# Patient Record
Sex: Male | Born: 1950 | Race: White | Hispanic: No | Marital: Married | Smoking: Former smoker
Health system: Southern US, Community
[De-identification: ages and names within clinical notes are randomized; demographics above are authoritative.]

---

## 2009-07-13 ENCOUNTER — Encounter: Payer: Self-pay | Admitting: Orthopedic Surgery

## 2009-07-14 ENCOUNTER — Encounter: Payer: Self-pay | Admitting: Orthopedic Surgery

## 2009-08-13 ENCOUNTER — Encounter: Payer: Self-pay | Admitting: Orthopedic Surgery

## 2014-01-19 ENCOUNTER — Encounter: Payer: Self-pay | Admitting: Cardiology

## 2014-02-13 ENCOUNTER — Encounter: Payer: Self-pay | Admitting: Cardiology

## 2014-03-16 ENCOUNTER — Encounter: Payer: Self-pay | Admitting: Cardiology

## 2014-04-14 ENCOUNTER — Encounter: Payer: Self-pay | Admitting: Cardiology

## 2020-11-04 ENCOUNTER — Emergency Department: Payer: Medicare Other

## 2020-11-04 ENCOUNTER — Inpatient Hospital Stay
Admission: EM | Admit: 2020-11-04 | Discharge: 2020-11-09 | DRG: 917 | Disposition: A | Discharge status: Home Health | Payer: Medicare Other | Attending: Internal Medicine | Admitting: Internal Medicine

## 2020-11-04 ENCOUNTER — Inpatient Hospital Stay: Payer: Medicare Other

## 2020-11-04 ENCOUNTER — Other Ambulatory Visit: Payer: Self-pay

## 2020-11-04 ENCOUNTER — Encounter: Payer: Self-pay | Admitting: Pulmonary Disease

## 2020-11-04 DIAGNOSIS — Z6831 Body mass index (BMI) 31.0-31.9, adult: Secondary | ICD-10-CM

## 2020-11-04 DIAGNOSIS — I959 Hypotension, unspecified: Secondary | ICD-10-CM

## 2020-11-04 DIAGNOSIS — I2510 Atherosclerotic heart disease of native coronary artery without angina pectoris: Secondary | ICD-10-CM | POA: Diagnosis present

## 2020-11-04 DIAGNOSIS — T424X1A Poisoning by benzodiazepines, accidental (unintentional), initial encounter: Secondary | ICD-10-CM | POA: Diagnosis present

## 2020-11-04 DIAGNOSIS — T796XXA Traumatic ischemia of muscle, initial encounter: Secondary | ICD-10-CM | POA: Diagnosis present

## 2020-11-04 DIAGNOSIS — G8929 Other chronic pain: Secondary | ICD-10-CM | POA: Diagnosis present

## 2020-11-04 DIAGNOSIS — T4275XA Adverse effect of unspecified antiepileptic and sedative-hypnotic drugs, initial encounter: Secondary | ICD-10-CM | POA: Diagnosis not present

## 2020-11-04 DIAGNOSIS — J9621 Acute and chronic respiratory failure with hypoxia: Secondary | ICD-10-CM | POA: Diagnosis present

## 2020-11-04 DIAGNOSIS — Z20822 Contact with and (suspected) exposure to covid-19: Secondary | ICD-10-CM | POA: Diagnosis present

## 2020-11-04 DIAGNOSIS — G929 Unspecified toxic encephalopathy: Secondary | ICD-10-CM | POA: Diagnosis present

## 2020-11-04 DIAGNOSIS — E871 Hypo-osmolality and hyponatremia: Secondary | ICD-10-CM | POA: Diagnosis present

## 2020-11-04 DIAGNOSIS — T403X1A Poisoning by methadone, accidental (unintentional), initial encounter: Secondary | ICD-10-CM | POA: Diagnosis present

## 2020-11-04 DIAGNOSIS — J45909 Unspecified asthma, uncomplicated: Secondary | ICD-10-CM | POA: Diagnosis present

## 2020-11-04 DIAGNOSIS — Z7951 Long term (current) use of inhaled steroids: Secondary | ICD-10-CM

## 2020-11-04 DIAGNOSIS — J690 Pneumonitis due to inhalation of food and vomit: Secondary | ICD-10-CM | POA: Diagnosis present

## 2020-11-04 DIAGNOSIS — E119 Type 2 diabetes mellitus without complications: Secondary | ICD-10-CM | POA: Diagnosis present

## 2020-11-04 DIAGNOSIS — R4189 Other symptoms and signs involving cognitive functions and awareness: Secondary | ICD-10-CM | POA: Diagnosis not present

## 2020-11-04 DIAGNOSIS — T50901A Poisoning by unspecified drugs, medicaments and biological substances, accidental (unintentional), initial encounter: Secondary | ICD-10-CM

## 2020-11-04 DIAGNOSIS — Z792 Long term (current) use of antibiotics: Secondary | ICD-10-CM

## 2020-11-04 DIAGNOSIS — G4733 Obstructive sleep apnea (adult) (pediatric): Secondary | ICD-10-CM | POA: Diagnosis present

## 2020-11-04 DIAGNOSIS — Z881 Allergy status to other antibiotic agents status: Secondary | ICD-10-CM

## 2020-11-04 DIAGNOSIS — I10 Essential (primary) hypertension: Secondary | ICD-10-CM | POA: Diagnosis present

## 2020-11-04 DIAGNOSIS — I214 Non-ST elevation (NSTEMI) myocardial infarction: Secondary | ICD-10-CM | POA: Diagnosis present

## 2020-11-04 DIAGNOSIS — M797 Fibromyalgia: Secondary | ICD-10-CM | POA: Diagnosis present

## 2020-11-04 DIAGNOSIS — T426X1A Poisoning by other antiepileptic and sedative-hypnotic drugs, accidental (unintentional), initial encounter: Secondary | ICD-10-CM | POA: Diagnosis present

## 2020-11-04 DIAGNOSIS — E669 Obesity, unspecified: Secondary | ICD-10-CM | POA: Diagnosis present

## 2020-11-04 DIAGNOSIS — Z7982 Long term (current) use of aspirin: Secondary | ICD-10-CM

## 2020-11-04 DIAGNOSIS — I252 Old myocardial infarction: Secondary | ICD-10-CM

## 2020-11-04 DIAGNOSIS — T40421A Poisoning by tramadol, accidental (unintentional), initial encounter: Principal | ICD-10-CM | POA: Diagnosis present

## 2020-11-04 DIAGNOSIS — N400 Enlarged prostate without lower urinary tract symptoms: Secondary | ICD-10-CM | POA: Diagnosis present

## 2020-11-04 DIAGNOSIS — E875 Hyperkalemia: Secondary | ICD-10-CM | POA: Diagnosis present

## 2020-11-04 DIAGNOSIS — M069 Rheumatoid arthritis, unspecified: Secondary | ICD-10-CM | POA: Diagnosis present

## 2020-11-04 DIAGNOSIS — Z7984 Long term (current) use of oral hypoglycemic drugs: Secondary | ICD-10-CM

## 2020-11-04 DIAGNOSIS — J8410 Pulmonary fibrosis, unspecified: Secondary | ICD-10-CM | POA: Diagnosis present

## 2020-11-04 DIAGNOSIS — I952 Hypotension due to drugs: Secondary | ICD-10-CM | POA: Diagnosis not present

## 2020-11-04 DIAGNOSIS — K219 Gastro-esophageal reflux disease without esophagitis: Secondary | ICD-10-CM | POA: Diagnosis present

## 2020-11-04 DIAGNOSIS — T50901S Poisoning by unspecified drugs, medicaments and biological substances, accidental (unintentional), sequela: Secondary | ICD-10-CM | POA: Diagnosis not present

## 2020-11-04 DIAGNOSIS — D869 Sarcoidosis, unspecified: Secondary | ICD-10-CM | POA: Diagnosis present

## 2020-11-04 DIAGNOSIS — G9340 Encephalopathy, unspecified: Secondary | ICD-10-CM | POA: Diagnosis not present

## 2020-11-04 DIAGNOSIS — N179 Acute kidney failure, unspecified: Secondary | ICD-10-CM | POA: Diagnosis present

## 2020-11-04 DIAGNOSIS — Z880 Allergy status to penicillin: Secondary | ICD-10-CM

## 2020-11-04 DIAGNOSIS — Z9119 Patient's noncompliance with other medical treatment and regimen: Secondary | ICD-10-CM

## 2020-11-04 DIAGNOSIS — R112 Nausea with vomiting, unspecified: Secondary | ICD-10-CM | POA: Diagnosis not present

## 2020-11-04 DIAGNOSIS — Z79899 Other long term (current) drug therapy: Secondary | ICD-10-CM

## 2020-11-04 DIAGNOSIS — T50901D Poisoning by unspecified drugs, medicaments and biological substances, accidental (unintentional), subsequent encounter: Secondary | ICD-10-CM | POA: Diagnosis not present

## 2020-11-04 DIAGNOSIS — J9601 Acute respiratory failure with hypoxia: Secondary | ICD-10-CM | POA: Diagnosis not present

## 2020-11-04 DIAGNOSIS — D860 Sarcoidosis of lung: Secondary | ICD-10-CM | POA: Diagnosis present

## 2020-11-04 LAB — URINE DRUG SCREEN, QUALITATIVE (ARMC ONLY)
Amphetamines, Ur Screen: NOT DETECTED
Barbiturates, Ur Screen: NOT DETECTED
Benzodiazepine, Ur Scrn: POSITIVE — AB
Cannabinoid 50 Ng, Ur ~~LOC~~: NOT DETECTED
Cocaine Metabolite,Ur ~~LOC~~: NOT DETECTED
MDMA (Ecstasy)Ur Screen: NOT DETECTED
Methadone Scn, Ur: POSITIVE — AB
Opiate, Ur Screen: POSITIVE — AB
Phencyclidine (PCP) Ur S: NOT DETECTED
Tricyclic, Ur Screen: POSITIVE — AB

## 2020-11-04 LAB — CBC WITH DIFFERENTIAL/PLATELET
Abs Immature Granulocytes: 0.06 10*3/uL (ref 0.00–0.07)
Basophils Absolute: 0.1 10*3/uL (ref 0.0–0.1)
Basophils Relative: 1 %
Eosinophils Absolute: 0.3 10*3/uL (ref 0.0–0.5)
Eosinophils Relative: 2 %
HCT: 37 % — ABNORMAL LOW (ref 39.0–52.0)
Hemoglobin: 12.5 g/dL — ABNORMAL LOW (ref 13.0–17.0)
Immature Granulocytes: 1 %
Lymphocytes Relative: 18 %
Lymphs Abs: 2.2 10*3/uL (ref 0.7–4.0)
MCH: 31.6 pg (ref 26.0–34.0)
MCHC: 33.8 g/dL (ref 30.0–36.0)
MCV: 93.7 fL (ref 80.0–100.0)
Monocytes Absolute: 0.8 10*3/uL (ref 0.1–1.0)
Monocytes Relative: 7 %
Neutro Abs: 8.4 10*3/uL — ABNORMAL HIGH (ref 1.7–7.7)
Neutrophils Relative %: 71 %
Platelets: 223 10*3/uL (ref 150–400)
RBC: 3.95 MIL/uL — ABNORMAL LOW (ref 4.22–5.81)
RDW: 13.2 % (ref 11.5–15.5)
WBC: 11.8 10*3/uL — ABNORMAL HIGH (ref 4.0–10.5)
nRBC: 0 % (ref 0.0–0.2)

## 2020-11-04 LAB — RESP PANEL BY RT-PCR (FLU A&B, COVID) ARPGX2
Influenza A by PCR: NEGATIVE
Influenza B by PCR: NEGATIVE
SARS Coronavirus 2 by RT PCR: NEGATIVE

## 2020-11-04 LAB — TROPONIN I (HIGH SENSITIVITY)
Troponin I (High Sensitivity): 12 ng/L (ref <18)
Troponin I (High Sensitivity): 11 ng/L (ref <18)

## 2020-11-04 LAB — BLOOD GAS, ARTERIAL
Acid-base deficit: 8.2 mmol/L — ABNORMAL HIGH (ref 0.0–2.0)
Bicarbonate: 18.9 mmol/L — ABNORMAL LOW (ref 20.0–28.0)
FIO2: 0.8
MECHVT: 500 mL
O2 Saturation: 89.5 %
PEEP: 5 cmH2O
Patient temperature: 37
RATE: 16 resp/min
pCO2 arterial: 44 mmHg (ref 32.0–48.0)
pH, Arterial: 7.24 — ABNORMAL LOW (ref 7.350–7.450)
pO2, Arterial: 68 mmHg — ABNORMAL LOW (ref 83.0–108.0)

## 2020-11-04 LAB — COMPREHENSIVE METABOLIC PANEL
ALT: 19 U/L (ref 0–44)
AST: 31 U/L (ref 15–41)
Albumin: 4 g/dL (ref 3.5–5.0)
Alkaline Phosphatase: 52 U/L (ref 38–126)
Anion gap: 14 (ref 5–15)
BUN: 39 mg/dL — ABNORMAL HIGH (ref 8–23)
CO2: 19 mmol/L — ABNORMAL LOW (ref 22–32)
Calcium: 10.2 mg/dL (ref 8.9–10.3)
Chloride: 95 mmol/L — ABNORMAL LOW (ref 98–111)
Creatinine, Ser: 2.95 mg/dL — ABNORMAL HIGH (ref 0.61–1.24)
GFR, Estimated: 22 mL/min — ABNORMAL LOW (ref >60)
Glucose, Bld: 221 mg/dL — ABNORMAL HIGH (ref 70–99)
Potassium: 6.7 mmol/L (ref 3.5–5.1)
Sodium: 128 mmol/L — ABNORMAL LOW (ref 135–145)
Total Bilirubin: 0.9 mg/dL (ref 0.3–1.2)
Total Protein: 7 g/dL (ref 6.5–8.1)

## 2020-11-04 LAB — BLOOD GAS, VENOUS
Acid-base deficit: 6.9 mmol/L — ABNORMAL HIGH (ref 0.0–2.0)
Bicarbonate: 20.2 mmol/L (ref 20.0–28.0)
O2 Saturation: 75.8 %
Patient temperature: 37
pCO2, Ven: 46 mmHg (ref 44.0–60.0)
pH, Ven: 7.25 (ref 7.250–7.430)
pO2, Ven: 48 mmHg — ABNORMAL HIGH (ref 32.0–45.0)

## 2020-11-04 LAB — LACTIC ACID, PLASMA
Lactic Acid, Venous: 2.6 mmol/L (ref 0.5–1.9)
Lactic Acid, Venous: 3 mmol/L (ref 0.5–1.9)
Lactic Acid, Venous: 2 mmol/L (ref 0.5–1.9)
Lactic Acid, Venous: 2.2 mmol/L (ref 0.5–1.9)

## 2020-11-04 LAB — BASIC METABOLIC PANEL
Anion gap: 8 (ref 5–15)
BUN: 38 mg/dL — ABNORMAL HIGH (ref 8–23)
CO2: 19 mmol/L — ABNORMAL LOW (ref 22–32)
Calcium: 9.3 mg/dL (ref 8.9–10.3)
Chloride: 106 mmol/L (ref 98–111)
Creatinine, Ser: 2.37 mg/dL — ABNORMAL HIGH (ref 0.61–1.24)
GFR, Estimated: 29 mL/min — ABNORMAL LOW (ref >60)
Glucose, Bld: 159 mg/dL — ABNORMAL HIGH (ref 70–99)
Potassium: 5 mmol/L (ref 3.5–5.1)
Sodium: 133 mmol/L — ABNORMAL LOW (ref 135–145)

## 2020-11-04 LAB — GLUCOSE, CAPILLARY
Glucose-Capillary: 148 mg/dL — ABNORMAL HIGH (ref 70–99)
Glucose-Capillary: 129 mg/dL — ABNORMAL HIGH (ref 70–99)

## 2020-11-04 LAB — URINALYSIS, COMPLETE (UACMP) WITH MICROSCOPIC
Bacteria, UA: NONE SEEN
Bilirubin Urine: NEGATIVE
Glucose, UA: NEGATIVE mg/dL
Hgb urine dipstick: NEGATIVE
Ketones, ur: NEGATIVE mg/dL
Leukocytes,Ua: NEGATIVE
Nitrite: NEGATIVE
Protein, ur: NEGATIVE mg/dL
Specific Gravity, Urine: 1.011 (ref 1.005–1.030)
Squamous Epithelial / HPF: NONE SEEN (ref 0–5)
pH: 5 (ref 5.0–8.0)

## 2020-11-04 LAB — ACETAMINOPHEN LEVEL
Acetaminophen (Tylenol), Serum: 73 ug/mL — ABNORMAL HIGH (ref 10–30)
Acetaminophen (Tylenol), Serum: 34 ug/mL — ABNORMAL HIGH (ref 10–30)
Acetaminophen (Tylenol), Serum: 19 ug/mL (ref 10–30)

## 2020-11-04 LAB — PROTIME-INR
INR: 1.1 (ref 0.8–1.2)
Prothrombin Time: 14.3 seconds (ref 11.4–15.2)

## 2020-11-04 LAB — SALICYLATE LEVEL: Salicylate Lvl: <7 mg/dL — ABNORMAL LOW (ref 7.0–30.0)

## 2020-11-04 LAB — PROCALCITONIN: Procalcitonin: <0.1 ng/mL

## 2020-11-04 LAB — APTT: aPTT: 34 seconds (ref 24–36)

## 2020-11-04 LAB — MRSA NEXT GEN BY PCR, NASAL: MRSA by PCR Next Gen: NOT DETECTED

## 2020-11-04 LAB — HIV ANTIBODY (ROUTINE TESTING W REFLEX): HIV Screen 4th Generation wRfx: NONREACTIVE

## 2020-11-04 LAB — CBG MONITORING, ED: Glucose-Capillary: 144 mg/dL — ABNORMAL HIGH (ref 70–99)

## 2020-11-04 LAB — CK: Total CK: 1950 U/L — ABNORMAL HIGH (ref 49–397)

## 2020-11-04 MED ORDER — HEPARIN SODIUM (PORCINE) 5000 UNIT/ML IJ SOLN
5000.0000 [IU] | Freq: Three times a day (TID) | INTRAMUSCULAR | Status: DC
  Administered 2020-11-04 – 2020-11-07 (×10): 5000 [IU] via SUBCUTANEOUS
  Filled 2020-11-04 (×10): qty 1

## 2020-11-04 MED ORDER — SODIUM CHLORIDE 0.9 % IV SOLN
1.0000 g | Freq: Once | INTRAVENOUS | Status: AC
  Administered 2020-11-04: 1 g via INTRAVENOUS
  Filled 2020-11-04: qty 10

## 2020-11-04 MED ORDER — ROCURONIUM BROMIDE 10 MG/ML (PF) SYRINGE
PREFILLED_SYRINGE | INTRAVENOUS | Status: AC
  Filled 2020-11-04: qty 10

## 2020-11-04 MED ORDER — METRONIDAZOLE 500 MG/100ML IV SOLN
500.0000 mg | Freq: Three times a day (TID) | INTRAVENOUS | Status: DC
  Administered 2020-11-04 – 2020-11-06 (×6): 500 mg via INTRAVENOUS
  Filled 2020-11-04 (×7): qty 100

## 2020-11-04 MED ORDER — SODIUM CHLORIDE 0.9 % IV SOLN
500.0000 mg | Freq: Once | INTRAVENOUS | Status: AC
  Administered 2020-11-04: 500 mg via INTRAVENOUS
  Filled 2020-11-04: qty 500

## 2020-11-04 MED ORDER — PROPOFOL 1000 MG/100ML IV EMUL
5.0000 ug/kg/min | INTRAVENOUS | Status: DC
  Administered 2020-11-04: 15 ug/kg/min via INTRAVENOUS
  Administered 2020-11-04: 5 ug/kg/min via INTRAVENOUS
  Administered 2020-11-04: 10 ug/kg/min via INTRAVENOUS
  Administered 2020-11-04: 50 ug/kg/min via INTRAVENOUS
  Administered 2020-11-05 (×3): 60 ug/kg/min via INTRAVENOUS
  Administered 2020-11-05 (×2): 50 ug/kg/min via INTRAVENOUS
  Filled 2020-11-04 (×5): qty 100

## 2020-11-04 MED ORDER — DOCUSATE SODIUM 50 MG/5ML PO LIQD: 100.0000 mg | Freq: Two times a day (BID) | ORAL | Status: DC | PRN

## 2020-11-04 MED ORDER — FAMOTIDINE 20 MG PO TABS
20.0000 mg | ORAL_TABLET | Freq: Every day | ORAL | Status: DC
  Administered 2020-11-04: 20 mg
  Filled 2020-11-04: qty 1

## 2020-11-04 MED ORDER — CHLORHEXIDINE GLUCONATE CLOTH 2 % EX PADS
6.0000 | MEDICATED_PAD | Freq: Every day | CUTANEOUS | Status: DC
  Administered 2020-11-04 – 2020-11-08 (×4): 6 via TOPICAL
  Filled 2020-11-04: qty 6

## 2020-11-04 MED ORDER — DOCUSATE SODIUM 100 MG PO CAPS: 100.0000 mg | ORAL_CAPSULE | Freq: Two times a day (BID) | ORAL | Status: DC | PRN

## 2020-11-04 MED ORDER — ETOMIDATE 2 MG/ML IV SOLN
INTRAVENOUS | Status: AC
  Filled 2020-11-04: qty 20

## 2020-11-04 MED ORDER — POLYETHYLENE GLYCOL 3350 17 G PO PACK: 17.0000 g | PACK | Freq: Every day | ORAL | Status: DC | PRN

## 2020-11-04 MED ORDER — FAMOTIDINE 20 MG IN NS 100 ML IVPB
20.0000 mg | Freq: Two times a day (BID) | INTRAVENOUS | Status: DC
  Administered 2020-11-04: 20 mg via INTRAVENOUS
  Filled 2020-11-04: qty 100

## 2020-11-04 MED ORDER — BUDESONIDE 0.5 MG/2ML IN SUSP
0.5000 mg | Freq: Two times a day (BID) | RESPIRATORY_TRACT | Status: DC
  Administered 2020-11-04 – 2020-11-09 (×11): 0.5 mg via RESPIRATORY_TRACT
  Filled 2020-11-04 (×12): qty 2

## 2020-11-04 MED ORDER — CALCIUM GLUCONATE-NACL 1-0.675 GM/50ML-% IV SOLN
1.0000 g | Freq: Once | INTRAVENOUS | Status: AC
  Administered 2020-11-04: 1000 mg via INTRAVENOUS
  Filled 2020-11-04: qty 50

## 2020-11-04 MED ORDER — ALBUTEROL SULFATE (2.5 MG/3ML) 0.083% IN NEBU
2.5000 mg | INHALATION_SOLUTION | Freq: Once | RESPIRATORY_TRACT | Status: AC
  Administered 2020-11-04: 2.5 mg via RESPIRATORY_TRACT

## 2020-11-04 MED ORDER — SODIUM CHLORIDE 0.9 % IV SOLN
3.0000 g | Freq: Once | INTRAVENOUS | Status: DC
  Filled 2020-11-04: qty 8

## 2020-11-04 MED ORDER — SODIUM ZIRCONIUM CYCLOSILICATE 5 G PO PACK
10.0000 g | PACK | Freq: Once | ORAL | Status: AC
  Administered 2020-11-04: 10 g via ORAL
  Filled 2020-11-04: qty 2
  Filled 2020-11-04: qty 1

## 2020-11-04 MED ORDER — SODIUM BICARBONATE 8.4 % IV SOLN
50.0000 meq | Freq: Once | INTRAVENOUS | Status: AC
  Administered 2020-11-04: 50 meq via INTRAVENOUS
  Filled 2020-11-04: qty 50

## 2020-11-04 MED ORDER — NALOXONE HCL 2 MG/2ML IJ SOSY
PREFILLED_SYRINGE | INTRAMUSCULAR | Status: AC
  Administered 2020-11-04: 0.4 mg via INTRAVENOUS
  Filled 2020-11-04: qty 2

## 2020-11-04 MED ORDER — NOREPINEPHRINE 4 MG/250ML-% IV SOLN
0.0000 ug/min | INTRAVENOUS | Status: DC
  Administered 2020-11-04 – 2020-11-05 (×2): 2 ug/min via INTRAVENOUS
  Filled 2020-11-04: qty 250

## 2020-11-04 MED ORDER — FAMOTIDINE 20 MG PO TABS: 20.0000 mg | ORAL_TABLET | Freq: Two times a day (BID) | ORAL | Status: DC

## 2020-11-04 MED ORDER — LACTATED RINGERS IV BOLUS
1000.0000 mL | Freq: Once | INTRAVENOUS | Status: AC
  Administered 2020-11-04: 1000 mL via INTRAVENOUS

## 2020-11-04 MED ORDER — SODIUM CHLORIDE 0.9 % IV SOLN
2.0000 g | INTRAVENOUS | Status: DC
  Administered 2020-11-05 – 2020-11-09 (×5): 2 g via INTRAVENOUS
  Filled 2020-11-04 (×2): qty 2
  Filled 2020-11-04: qty 20
  Filled 2020-11-04: qty 2
  Filled 2020-11-04 (×2): qty 20

## 2020-11-04 MED ORDER — ORAL CARE MOUTH RINSE
15.0000 mL | OROMUCOSAL | Status: DC
  Administered 2020-11-04 – 2020-11-05 (×8): 15 mL via OROMUCOSAL
  Filled 2020-11-04 (×6): qty 15

## 2020-11-04 MED ORDER — METHYLPREDNISOLONE SODIUM SUCC 40 MG IJ SOLR
40.0000 mg | Freq: Every day | INTRAMUSCULAR | Status: DC
  Administered 2020-11-04 – 2020-11-05 (×2): 40 mg via INTRAVENOUS
  Filled 2020-11-04 (×2): qty 1

## 2020-11-04 MED ORDER — LACTATED RINGERS IV SOLN: INTRAVENOUS | Status: AC

## 2020-11-04 MED ORDER — PROPOFOL 1000 MG/100ML IV EMUL: 5.0000 ug/kg/min | INTRAVENOUS | Status: DC

## 2020-11-04 MED ORDER — IPRATROPIUM-ALBUTEROL 0.5-2.5 (3) MG/3ML IN SOLN
3.0000 mL | Freq: Four times a day (QID) | RESPIRATORY_TRACT | Status: DC
  Administered 2020-11-04 – 2020-11-06 (×7): 3 mL via RESPIRATORY_TRACT
  Filled 2020-11-04 (×7): qty 3

## 2020-11-04 MED ORDER — INSULIN ASPART 100 UNIT/ML IJ SOLN
0.0000 [IU] | INTRAMUSCULAR | Status: DC
  Administered 2020-11-04 (×4): 2 [IU] via SUBCUTANEOUS
  Filled 2020-11-04 (×4): qty 1

## 2020-11-04 MED ORDER — SODIUM CHLORIDE 0.9 % IV BOLUS
1000.0000 mL | Freq: Once | INTRAVENOUS | Status: AC
  Administered 2020-11-04: 1000 mL via INTRAVENOUS

## 2020-11-04 MED ORDER — DEXTROSE 50 % IV SOLN
1.0000 | Freq: Once | INTRAVENOUS | Status: AC
  Administered 2020-11-04: 50 mL via INTRAVENOUS
  Filled 2020-11-04: qty 50

## 2020-11-04 MED ORDER — INSULIN ASPART 100 UNIT/ML IJ SOLN
10.0000 [IU] | Freq: Once | INTRAMUSCULAR | Status: AC
  Administered 2020-11-04: 10 [IU] via INTRAVENOUS
  Filled 2020-11-04: qty 1

## 2020-11-04 MED ORDER — CHLORHEXIDINE GLUCONATE 0.12% ORAL RINSE (MEDLINE KIT)
15.0000 mL | Freq: Two times a day (BID) | OROMUCOSAL | Status: DC
  Administered 2020-11-04 – 2020-11-05 (×2): 15 mL via OROMUCOSAL
  Filled 2020-11-04 (×2): qty 15

## 2020-11-04 MED ORDER — ALBUTEROL SULFATE (2.5 MG/3ML) 0.083% IN NEBU
INHALATION_SOLUTION | RESPIRATORY_TRACT | Status: AC
  Administered 2020-11-04: 2.5 mg via RESPIRATORY_TRACT
  Filled 2020-11-04: qty 3

## 2020-11-04 MED ORDER — NALOXONE HCL 2 MG/2ML IJ SOSY: 0.4000 mg | PREFILLED_SYRINGE | Freq: Once | INTRAMUSCULAR | Status: AC

## 2020-11-04 MED ORDER — PROPOFOL 1000 MG/100ML IV EMUL
INTRAVENOUS | Status: AC
  Administered 2020-11-04: 5 ug/kg/min via INTRAVENOUS
  Filled 2020-11-04: qty 100

## 2020-11-04 NOTE — Progress Notes (Signed)
Notified bedside nurse of need to draw repeat lactic acid. 

## 2020-11-04 NOTE — Consult Note (Signed)
PHARMACY CONSULT NOTE - FOLLOW UP  Pharmacy Consult for Electrolyte Monitoring and Replacement   Recent Labs: Potassium (mmol/L)  Date Value  11/04/2020 6.7 (HH)   Calcium (mg/dL)  Date Value  97/98/9211 10.2   Albumin (g/dL)  Date Value  94/17/4081 4.0   Sodium (mmol/L)  Date Value  11/04/2020 128 (L)     Assessment: Patient admitted with acute respiratory failure likely d/t overdose. Pharmacy was consulted for electrolytes replacement  Goal of Therapy:  Electrolytes within normal limites  Plan:  Potassium extremely elevated at 6.7. Insulin, calcium and D50 given per provider orders. Daily Lokelma also initialed.  Calcium within normal limits. Notes Na slight below goal at 128. Multiple bolus of NaCl given today, no additional action needed.   Will continue to follow and replace as needed.  Dillian Feig Rodriguez-Guzman PharmD, BCPS 11/04/2020 4:39 PM

## 2020-11-04 NOTE — Progress Notes (Signed)
Elink following for code sepsis 

## 2020-11-04 NOTE — Plan of Care (Signed)
  Problem: Education: Goal: Knowledge of General Education information will improve Description: Including pain rating scale, medication(s)/side effects and non-pharmacologic comfort measures Outcome: Not Progressing   Problem: Health Behavior/Discharge Planning: Goal: Ability to manage health-related needs will improve Outcome: Not Progressing   Problem: Clinical Measurements: Goal: Ability to maintain clinical measurements within normal limits will improve Outcome: Not Progressing Goal: Will remain free from infection Outcome: Not Progressing Goal: Diagnostic test results will improve Outcome: Not Progressing Goal: Respiratory complications will improve Outcome: Not Progressing Goal: Cardiovascular complication will be avoided Outcome: Not Progressing   Problem: Activity: Goal: Risk for activity intolerance will decrease Outcome: Not Progressing   Problem: Nutrition: Goal: Adequate nutrition will be maintained Outcome: Not Progressing   Problem: Coping: Goal: Level of anxiety will decrease Outcome: Not Progressing   Problem: Elimination: Goal: Will not experience complications related to bowel motility Outcome: Not Progressing Goal: Will not experience complications related to urinary retention Outcome: Not Progressing   Problem: Pain Managment: Goal: General experience of comfort will improve Outcome: Not Progressing   Problem: Safety: Goal: Ability to remain free from injury will improve Outcome: Not Progressing   Problem: Skin Integrity: Goal: Risk for impaired skin integrity will decrease Outcome: Not Progressing  Patient total care unable to make any needs known intubated and sedated

## 2020-11-04 NOTE — ED Notes (Signed)
Pt not able to protect airway at this time.   RT called to intubate pt.

## 2020-11-04 NOTE — Progress Notes (Signed)
Pt was transported to CCU from the ED while on the vent. 

## 2020-11-04 NOTE — Progress Notes (Signed)
PHARMACIST - PHYSICIAN COMMUNICATION  CONCERNING: IV to Oral Route Change Policy  RECOMMENDATION: This patient is receiving famotidine by the intravenous route.  Based on criteria approved by the Pharmacy and Therapeutics Committee, the intravenous medication(s) is/are being converted to the equivalent oral dose form(s).   DESCRIPTION: These criteria include: The patient is eating (either orally or via tube) and/or has been taking other orally administered medications for a least 24 hours The patient has no evidence of active gastrointestinal bleeding or impaired GI absorption (gastrectomy, short bowel, patient on TNA or NPO).  If you have questions about this conversion, please contact the Pharmacy Department    Tressie Ellis, [facility] 11/04/2020 5:55 PM

## 2020-11-04 NOTE — Progress Notes (Signed)
One set of blood cultures drawn before antibiotics given per bedside RN

## 2020-11-04 NOTE — Progress Notes (Signed)
Notified provider and bedside nurse of need to order and draw repeat lactic acid @ 1500.  

## 2020-11-04 NOTE — ED Notes (Signed)
Pt presents to ED with c/o of AMS and overdose on methadone. EMS states pt just started this Methadone this past Saturday for Fibromyalgia. EMS is unaware of dosage of methadone. Pt presents lethargic and will respond to only to pain.   Pt is also hypotensive of systolic in the 50's, NS bolus hung by pressure bag, MD at bedside.  EMS gave 4 mg of IM and 4mg  of of nasal.   1.5 mg of IV narcan given here, pt is starting to wake up but is still a poor historian at this time.

## 2020-11-04 NOTE — H&P (Signed)
NAME:  Ethan Frye, MRN:  [id number], DOB:  08-14-1950, LOS: 0 ADMISSION DATE:  11/04/2020, CONSULTATION DATE:  11/04/2020 REFERRING MD:  Dr. Quentin Cornwall, CHIEF COMPLAINT:  Altered Mental Status   Brief Pt Description / Synopsis:  70 year old male admitted with acute metabolic encephalopathy and acute hypoxic respiratory failure in the setting of suspected drug overdose (polysubstance including methadone) requiring intubation in the ED for airway protection.  Patient also with acute kidney injury and hyperkalemia, and concern for aspiration.  History of Present Illness:  Ethan Frye is a 70 year old with an extensive past medical history as listed below, who presented to [facility] ED on 11/04/2020 due to unresponsiveness.  Patient is currently critically ill, intubated and sedated, therefore history is obtained from patient's daughter-in-law and ED and nursing notes.  His daughter-in-law found him this morning unresponsive, with multiple pill bottles and pills scattered on his nightstand (bottles included methadone, clonazepam, Lyrica, Tramadol).  She reports he has been confused for approximately 2 months which she suspects is due to his Lyrica and clonazepam.  He was started on methadone last Saturday 10/30/2020 (supposed to can discontinue Lyrica and clonazepam), and reports he has been more confused than usual since starting the methadone.  She checked on him at approximately 1:00 am this morning and he was sleeping soundly, and no pill bottles were noted on the nightstand.  He received 8 mg of Narcan via EMS without improvement.  ED Course: Upon arrival to the ED he had spontaneous respirations, but was very somnolent, and was also hypotensive.  He was given additional dose of Narcan without response.  He remained altered with GCS of 6 and absent gag reflexes, thus he was subsequently intubated by the ED provider for airway protection.  Initial vital signs: Temperature 97.8 F axillary,  respiratory rate 18, pulse 51, blood pressure 108/72, Significant labs: Sodium 128, potassium 6.7, chloride 95, bicarb 19, glucose 221, BUN 39, creatinine 2.95, WBC 11.8 with neutrophilia, lactic acid 2.6, hemoglobin 12.5, acetaminophen 73, salicylates less than 7 ABG post intubation: pH 7.24/PCO2 44/PO2 68/HCO3 18.9 COVID-19 PCR is negative Urinalysis is pending Imaging: Chest x-ray>>Endotracheal tube is in place 2.8 cm above the carina. Lung volumes are slightly small with stable elevation of the right hemidiaphragm. Right perihilar atelectasis or infiltrate is unchanged. Patchy infiltrate has developed at the left lung baseq, possibly infectious or inflammatory in nature no pneumothorax or pleural effusion. Cardiac size within normal limits. No acute bone abnormality. CT head>>No acute intracranial pathology. There was high suspicion for probable aspiration, therefore sepsis work-up was initiated.  He was given 2 L of IV fluids along with IV azithromycin and ceftriaxone.  EKG did not exhibit peaked T waves or prolonged QT, however given his hyperkalemia he was given albuterol, calcium gluconate, insulin and D50 as temporizing measures.  PCCM is asked to admit the patient to ICU for further work-up and treatment of acute metabolic encephalopathy and acute hypoxic respiratory failure in the setting of suspected drug overdose requiring intubation in the ED for airway protection.  Patient also with acute kidney injury and hyperkalemia, and concern for aspiration.  Pertinent  Medical History  Obstructive sleep apnea not on CPAP due to intolerance Pulmonary sarcoidosis Stenosis of left bronchus Asthma Rheumatoid arthritis Coronary artery disease NSTEMI Hypertension Fibromyalgia BPH GERD Obesity Diabetes mellitus  Micro Data:  11/04/2020: SARS-CoV-2 and influenza PCR>> negative 11/04/2020: Blood culture>> 11/04/2020: Tracheal aspirate>>  Antimicrobials:  Azithromycin x1 dose  9/22 Ceftriaxone 9/22>> Flagyl 9/22>>  Significant Hospital Events:  Including procedures, antibiotic start and stop dates in addition to other pertinent events   11/04/2020: Presented to ED required in intubation for airway protection.  Concern for aspiration.  PCCM asked to admit  Interim History / Subjective:  -Presented to ED with altered mental status due to suspected drug overdose -Required intubation in the ED for airway protection -Concern for aspiration -Also with acute kidney injury and hyperkalemia ~temporizing measures given in the ED -Repeat BMP later this afternoon  Objective   Blood pressure 122/72, pulse (!) 45, temperature 97.8 F (36.6 C), temperature source Axillary, resp. rate 17, weight 97.7 kg, SpO2 100 %.    Vent Mode: AC FiO2 (%):  [80 %] 80 % Set Rate:  [16 bmp] 16 bmp Vt Set:  [500 mL] 500 mL PEEP:  [5 cmH20] 5 cmH20  No intake or output data in the 24 hours ending 11/04/20 1236 Filed Weights   11/04/20 1045  Weight: 97.7 kg    Examination: General: Acute on chronically ill-appearing male, laying in bed, intubated and sedated, no acute distress HENT: Atraumatic, normocephalic, neck supple, no JVD Lungs: Coarse breath sounds with expiratory wheezing throughout, ventilator assisted, even, nonlabored Cardiovascular: Bradycardia, regular rhythm, S1-S2, no murmurs rubs or gallops Abdomen: Obese, soft, nontender, nondistended, no guarding rebound tenderness, bowel sounds positive x4 Extremities: No deformities, no edema Neuro: Sedated, does not follow commands, pupils PERRLA GU: Foley catheter in place draining dark yellow urine  Resolved Hospital Problem list     Assessment & Plan:   Acute Hypoxic Respiratory Failure in setting of Drug overdose and suspected Aspiration PMHx of Pulmonary Sarcoidosis, OSA not on BiPAP for last 2 years due to intolerance, Asthma -Full vent support, implement lung protective strategies -Plateau pressures less than 30 cm  H20 -Wean FiO2 & PEEP as tolerated to maintain O2 sats >92% -Follow intermittent Chest X-ray & ABG as needed -Spontaneous Breathing Trials when respiratory parameters met and mental status permits -Implement VAP Bundle -Bronchodilators & Budesonide nebs -Start IV Solumedrol 40 mg daily -ABX as above  Hypotension, ? Septic shock due to Aspiration vs. Sedation related PMHx of CAD, NSTEMI, Hypertension -Continuous cardiac monitoring -Maintain MAP >65 -IV fluids (received 3L IVF boluses in ED) -Vasopressors as needed to maintain MAP goal -Trend lactic acid until normalized (2.6 ~ ) -Trend HS Troponin until peaked -Consider Echocardiogram   Concern for Aspiration -Monitor fever curve -Trend WBC's & Procalcitonin -Follow cultures as above -Continue empiric Ceftriaxone & Flagyl pending cultures & sensitivities  Acute Metabolic Encephalopathy in setting of suspected Drug Overdose (Polysubstance) & multiple metabolic derangements Sedation needs in setting of mechanical ventilation PMHx of Fibromyalgia, chronic pain -Maintain a RASS goal of 0 to -1 -Propofol as needed to maintain RASS goal -Avoid sedating medications as able -Daily wake up assessment -CT Head 11/04/20 negative -Urine drug screen positive for Benzodiazepines, Methadone, Opiates, and Tricyclics  Drug Overdose (Polysubstance: Methadone, benzodiazepines, opiates, tricyclics) Elevated Serum Tylenol (serum level 73 at admission) -Cardiac monitoring -Serial EKG's to follow QTc -Support ABC's -Follow BMP -Recheck serum tylenol in 4 hrs  -LFTs currently normal, will trend  Acute Kidney Injury Hyperkalemia Hyponatremia PMHx of BPH -Monitor I&O's / urinary output -Follow BMP -Ensure adequate renal perfusion -Avoid nephrotoxic agents as able -Replace electrolytes as indicated -IV Fluids -Check CK -Obtain Renal US -Received temporizing measures in ED (albuterol, calcium gluconate, insulin plus D50) ~repeat BMP later  this evening  Anemia without s/sx of overt bleeding -Monitor for S/Sx of bleeding -Trend CBC -Heparin SQ  for VTE Prophylaxis  -Transfuse for Hgb <7  Diabetes Mellitus -CBG's q4h; Target range of 140 to 180 -SSI -Follow ICU Hypo/Hyperglycemia protocol -Check Hgb A1c   Best Practice (right click and "Reselect all SmartList Selections" daily)   Diet/type: NPO DVT prophylaxis: prophylactic heparin  GI prophylaxis: H2B Lines: N/A Foley:  Yes, and it is still needed Code Status:  full code Last date of multidisciplinary goals of care discussion [N/A]  Updated pt's daughter-in-law at bedside 11/04/20   Labs   CBC: Recent Labs  Lab 11/04/20 0920  WBC 11.8*  NEUTROABS 8.4*  HGB 12.5*  HCT 37.0*  MCV 93.7  PLT 353    Basic Metabolic Panel: Recent Labs  Lab 11/04/20 0920  NA 128*  K 6.7*  CL 95*  CO2 19*  GLUCOSE 221*  BUN 39*  CREATININE 2.95*  CALCIUM 10.2   GFR: CrCl cannot be calculated (Unknown ideal weight.). Recent Labs  Lab 11/04/20 0920 11/04/20 1038  WBC 11.8*  --   LATICACIDVEN  --  2.6*    Liver Function Tests: Recent Labs  Lab 11/04/20 0920  AST 31  ALT 19  ALKPHOS 52  BILITOT 0.9  PROT 7.0  ALBUMIN 4.0   No results for input(s): LIPASE, AMYLASE in the last 168 hours. No results for input(s): AMMONIA in the last 168 hours.  ABG    Component Value Date/Time   PHART 7.24 (L) 11/04/2020 1020   PCO2ART 44 11/04/2020 1020   PO2ART 68 (L) 11/04/2020 1020   HCO3 20.2 11/04/2020 1042   ACIDBASEDEF 6.9 (H) 11/04/2020 1042   O2SAT 75.8 11/04/2020 1042     Coagulation Profile: Recent Labs  Lab 11/04/20 1039  INR 1.1    Cardiac Enzymes: No results for input(s): CKTOTAL, CKMB, CKMBINDEX, TROPONINI in the last 168 hours.  HbA1C: No results found for: HGBA1C  CBG: No results for input(s): GLUCAP in the last 168 hours.  Review of Systems:   Unable to assess due to AMS, intubation, sedation   Past Medical History:  He,  has  no past medical history on file.   Surgical History:     Social History:      Family History:  His family history is not on file.   Allergies Allergies  Allergen Reactions   Penicillins Rash   Ciprofloxacin     Other reaction(s): Dizziness Pt reports he took one dose and fainted, fell and hurt his leg   Azithromycin      Home Medications  Prior to Admission medications   Medication Sig Start Date End Date Taking? Authorizing Provider  acidophilus (RISAQUAD) CAPS capsule Take 1 capsule by mouth daily.   Yes [provider]  albuterol (PROVENTIL) (2.5 MG/3ML) 0.083% nebulizer solution Take 2.5 mg by nebulization every 6 (six) hours as needed for wheezing or shortness of breath.   Yes [provider]  albuterol (VENTOLIN HFA) 108 (90 Base) MCG/ACT inhaler Inhale 2 puffs into the lungs every 6 (six) hours as needed for wheezing or shortness of breath.   Yes [provider]  aspirin EC 81 MG tablet Take 81 mg by mouth daily. Swallow whole.   Yes [provider]  atorvastatin (LIPITOR) 20 MG tablet Take 20 mg by mouth daily. 09/14/20  Yes [provider]  azithromycin (ZITHROMAX) 500 MG tablet Take 500 mg by mouth every Monday, Wednesday, and Friday. 09/29/20  Yes [provider]  carvedilol (COREG) 25 MG tablet Take 25 mg by mouth 2 (two) times  daily. 08/16/20  Yes [provider]  celecoxib (CELEBREX) 200 MG capsule Take 200 mg by mouth daily as needed for pain. 07/15/20  Yes [provider]  cetirizine (ZYRTEC) 10 MG tablet Take 10 mg by mouth daily.   Yes [provider]  cholecalciferol (VITAMIN D) 25 MCG (1000 UNIT) tablet Take 1,000 Units by mouth daily.   Yes [provider]  clonazePAM (KLONOPIN) 1 MG tablet Take 1 mg by mouth 2 (two) times daily.   Yes [provider]  diclofenac Sodium (VOLTAREN) 1 % GEL Apply 2 g topically 4 (four) times daily.   Yes [provider]  doxepin  (SINEQUAN) 50 MG capsule Take 150 mg by mouth at bedtime. 10/21/20  Yes [provider]  DULoxetine (CYMBALTA) 60 MG capsule Take 60 mg by mouth daily. 09/01/20  Yes [provider]  ferrous sulfate 325 (65 FE) MG tablet Take 325 mg by mouth daily with breakfast.   Yes [provider]  fluticasone-salmeterol (ADVAIR) 500-50 MCG/ACT AEPB Inhale 1 puff into the lungs every 12 (twelve) hours.   Yes [provider]  HYDROcodone-acetaminophen (NORCO/VICODIN) 5-325 MG tablet Take 1 tablet by mouth every 4 (four) hours as needed for pain. 10/07/20  Yes [provider]  lisinopril (ZESTRIL) 40 MG tablet Take 40 mg by mouth daily. 10/02/20  Yes [provider]  metFORMIN (GLUCOPHAGE) 500 MG tablet Take 500-1,000 mg by mouth 2 (two) times daily with a meal. 08/04/20  Yes [provider]  methadone (DOLOPHINE) 5 MG tablet Take 2.5-5 mg by mouth 2 (two) times daily. Take 2.5 mg (one-half tablet) twice daily for one week then increase to 5 mg (one tablet) twice daily, thereafter 10/27/20  Yes [provider]  montelukast (SINGULAIR) 10 MG tablet Take 10 mg by mouth daily. 09/16/20  Yes [provider]  Multiple Vitamins-Minerals (MULTIVITAMIN WITH MINERALS) tablet Take 1 tablet by mouth daily.   Yes [provider]  nitrofurantoin, macrocrystal-monohydrate, (MACROBID) 100 MG capsule Take 100 mg by mouth every Monday, Wednesday, and Friday.   Yes [provider]  omeprazole (PRILOSEC) 40 MG capsule Take 40 mg by mouth daily. 09/08/20  Yes [provider]  pimecrolimus (ELIDEL) 1 % cream Apply 1 application topically 2 (two) times daily.   Yes [provider]  polyethylene glycol (MIRALAX / GLYCOLAX) 17 g packet Take 17 g by mouth 2 (two) times daily.   Yes [provider]  pregabalin (LYRICA) 75 MG capsule Take 75 mg by mouth 3 (three) times daily. 10/21/20  Yes [provider]  senna-docusate  (SENOKOT-S) 8.6-50 MG tablet Take 1 tablet by mouth daily.   Yes [provider]  spironolactone (ALDACTONE) 25 MG tablet Take 12.5 mg by mouth daily. 07/15/20  Yes [provider]  tiZANidine (ZANAFLEX) 4 MG tablet Take 4 mg by mouth 3 (three) times daily. 09/01/20  Yes [provider]  traMADol (ULTRAM) 50 MG tablet Take 50 mg by mouth every 6 (six) hours as needed for pain.   Yes [provider]  vitamin B-12 (CYANOCOBALAMIN) 1000 MCG tablet Take 1,000 mcg by mouth daily.   Yes [provider]  nystatin (MYCOSTATIN) 100000 UNIT/ML suspension Take 2 mLs by mouth in the morning, at noon, in the evening, and at bedtime. 06/22/20   [provider]  SUMAtriptan (IMITREX) 50 MG tablet Take 50 mg by mouth as directed. 10/16/20   [provider]     Critical care time: 2  minutes     Darel Hong, AGACNP-BC [facility] Prefer epic messenger for cross cover needs If after hours, please call E-link

## 2020-11-04 NOTE — ED Notes (Signed)
7.5 ET tube  23@ teeth, good color change on capno

## 2020-11-04 NOTE — Consult Note (Signed)
CODE SEPSIS - PHARMACY COMMUNICATION  **Broad Spectrum Antibiotics should be administered within 1 hour of Sepsis diagnosis**  Time Code Sepsis Called/Page Received: 4158  Antibiotics Ordered: Ceftriaxone & Azithromycin  Time of 1st antibiotic administration: 1037  Additional action taken by pharmacy: none  If necessary, Name of Provider/Nurse Contacted: N/A   Machael Raine Rodriguez-Guzman PharmD, BCPS 11/04/2020 10:00 AM

## 2020-11-04 NOTE — ED Provider Notes (Signed)
[facility] Emergency Department Provider Note    Event Date/Time   First MD Initiated Contact with Patient 11/04/20 [phone]     (approximate)  I have reviewed the triage vital signs and the nursing notes.   HISTORY  Chief Complaint Drug Overdose  Level V Caveat:  AMS  HPI EURAL HOVORKA is a 70 y.o. male with extensive past medical history presents to the ER found unresponsive by daughter-in-law after suspected overdose.  Last seen normal was several days ago.  Reportedly was recently started on methadone does have a history of taking more medication than he is supposed to.  Patient was given 8 mg of Narcan via EMS without improvement.  Patient with spontaneous respirations but very drowsy.  Reportedly hypotensive via EMS was given IV fluids.  No past medical history on file. No family history on file.  Patient Active Problem List   Diagnosis Date Noted   Drug overdose 11/04/2020       Prior to Admission medications   Medication Sig Start Date End Date Taking? Authorizing Provider  acidophilus (RISAQUAD) CAPS capsule Take 1 capsule by mouth daily.   Yes [provider]  albuterol (PROVENTIL) (2.5 MG/3ML) 0.083% nebulizer solution Take 2.5 mg by nebulization every 6 (six) hours as needed for wheezing or shortness of breath.   Yes [provider]  albuterol (VENTOLIN HFA) 108 (90 Base) MCG/ACT inhaler Inhale 2 puffs into the lungs every 6 (six) hours as needed for wheezing or shortness of breath.   Yes [provider]  aspirin EC 81 MG tablet Take 81 mg by mouth daily. Swallow whole.   Yes [provider]  atorvastatin (LIPITOR) 20 MG tablet Take 20 mg by mouth daily. 09/14/20  Yes [provider]  azithromycin (ZITHROMAX) 500 MG tablet Take 500 mg by mouth every Monday, Wednesday, and Friday. 09/29/20  Yes [provider]  carvedilol (COREG) 25 MG tablet Take 25 mg by mouth 2 (two) times daily. 08/16/20   Yes [provider]  celecoxib (CELEBREX) 200 MG capsule Take 200 mg by mouth daily as needed for pain. 07/15/20  Yes [provider]  cetirizine (ZYRTEC) 10 MG tablet Take 10 mg by mouth daily.   Yes [provider]  cholecalciferol (VITAMIN D) 25 MCG (1000 UNIT) tablet Take 1,000 Units by mouth daily.   Yes [provider]  clonazePAM (KLONOPIN) 1 MG tablet Take 1 mg by mouth 2 (two) times daily.   Yes [provider]  diclofenac Sodium (VOLTAREN) 1 % GEL Apply 2 g topically 4 (four) times daily.   Yes [provider]  doxepin (SINEQUAN) 50 MG capsule Take 150 mg by mouth at bedtime. 10/21/20  Yes [provider]  DULoxetine (CYMBALTA) 60 MG capsule Take 60 mg by mouth daily. 09/01/20  Yes [provider]  ferrous sulfate 325 (65 FE) MG tablet Take 325 mg by mouth daily with breakfast.   Yes [provider]  fluticasone-salmeterol (ADVAIR) 500-50 MCG/ACT AEPB Inhale 1 puff into the lungs every 12 (twelve) hours.   Yes [provider]  HYDROcodone-acetaminophen (NORCO/VICODIN) 5-325 MG tablet Take 1 tablet by mouth every 4 (four) hours as needed for pain. 10/07/20  Yes [provider]  lisinopril (ZESTRIL) 40 MG tablet Take 40 mg by mouth daily. 10/02/20  Yes [provider]  metFORMIN (GLUCOPHAGE) 500 MG tablet Take 500-1,000 mg by mouth 2 (two) times daily with a meal. 08/04/20  Yes [provider]  methadone (DOLOPHINE) 5 MG tablet Take 2.5-5 mg by mouth 2 (two) times daily. Take 2.5 mg (one-half tablet) twice daily for one week then increase to 5 mg (one tablet) twice daily, thereafter 10/27/20  Yes [provider]  montelukast (SINGULAIR) 10 MG tablet Take 10 mg by mouth daily. 09/16/20  Yes [provider]  Multiple Vitamins-Minerals (MULTIVITAMIN WITH MINERALS) tablet Take 1 tablet by mouth daily.   Yes [provider]  nitrofurantoin, macrocrystal-monohydrate,  (MACROBID) 100 MG capsule Take 100 mg by mouth every Monday, Wednesday, and Friday.   Yes [provider]  omeprazole (PRILOSEC) 40 MG capsule Take 40 mg by mouth daily. 09/08/20  Yes [provider]  pimecrolimus (ELIDEL) 1 % cream Apply 1 application topically 2 (two) times daily.   Yes [provider]  polyethylene glycol (MIRALAX / GLYCOLAX) 17 g packet Take 17 g by mouth 2 (two) times daily.   Yes [provider]  pregabalin (LYRICA) 75 MG capsule Take 75 mg by mouth 3 (three) times daily. 10/21/20  Yes [provider]  senna-docusate (SENOKOT-S) 8.6-50 MG tablet Take 1 tablet by mouth daily.   Yes [provider]  spironolactone (ALDACTONE) 25 MG tablet Take 12.5 mg by mouth daily. 07/15/20  Yes [provider]  tiZANidine (ZANAFLEX) 4 MG tablet Take 4 mg by mouth 3 (three) times daily. 09/01/20  Yes [provider]  traMADol (ULTRAM) 50 MG tablet Take 50 mg by mouth every 6 (six) hours as needed for pain.   Yes [provider]  vitamin B-12 (CYANOCOBALAMIN) 1000 MCG tablet Take 1,000 mcg by mouth daily.   Yes [provider]  nystatin (MYCOSTATIN) 100000 UNIT/ML suspension Take 2 mLs by mouth in the morning, at noon, in the evening, and at bedtime. 06/22/20   [provider]  SUMAtriptan (IMITREX) 50 MG tablet Take 50 mg by mouth as directed. 10/16/20   [provider]    Allergies Penicillins, Ciprofloxacin, and Azithromycin    Social History    Review of Systems Patient denies headaches, rhinorrhea, blurry vision, numbness, shortness of breath, chest pain, edema, cough, abdominal pain, nausea, vomiting, diarrhea, dysuria, fevers, rashes or hallucinations unless otherwise stated above in HPI. ____________________________________________   PHYSICAL EXAM:  VITAL SIGNS: Vitals:   11/04/20 1100 11/04/20 1200  BP: 125/76 122/72  Pulse: (!) 51 (!) 45  Resp: 18 17  Temp:    SpO2:  100% 100%    Constitutional: ill appearing, gcs 6, snoring respirations  Eyes: Conjunctivae are normal. 89mm bilat Head: Atraumatic. Nose: No congestion/rhinnorhea. Mouth/Throat: Mucous membranes are moist.   Neck: No stridor. Painless ROM.  Cardiovascular: Normal rate, regular rhythm. Grossly normal heart sounds.   Respiratory: spontaneous, snoring respirations Gastrointestinal: Soft and nontender. No abdominal bruits. No CVA tenderness. Genitourinary: deferred Musculoskeletal: No lower extremity tenderness nor edema.  No joint effusions. Neurologic:  gcs 6 Skin:  Skin is warm, dry and intact. No rash noted. Psychiatric: unable to assess  ____________________________________________   LABS (all labs ordered are listed, but only abnormal results are displayed)  Results for orders placed or performed during the hospital encounter of 11/04/20 (from the past 24 hour(s))  Urine Drug Screen, Qualitative ([facility] only)     Status: Abnormal   Collection Time: 11/04/20  9:16 AM  Result Value Ref Range   Tricyclic, Ur Screen POSITIVE (A) NONE DETECTED   Amphetamines, Ur Screen NONE DETECTED NONE DETECTED   MDMA (Ecstasy)Ur Screen NONE DETECTED NONE DETECTED  Cocaine Metabolite,Ur [place] NONE DETECTED NONE DETECTED   Opiate, Ur Screen POSITIVE (A) NONE DETECTED   Phencyclidine (PCP) Ur S NONE DETECTED NONE DETECTED   Cannabinoid 50 Ng, Ur [place] NONE DETECTED NONE DETECTED   Barbiturates, Ur Screen NONE DETECTED NONE DETECTED   Benzodiazepine, Ur Scrn POSITIVE (A) NONE DETECTED   Methadone Scn, Ur POSITIVE (A) NONE DETECTED  CBC with Differential     Status: Abnormal   Collection Time: 11/04/20  9:20 AM  Result Value Ref Range   WBC 11.8 (H) 4.0 - 10.5 K/uL   RBC 3.95 (L) 4.22 - 5.81 MIL/uL   Hemoglobin 12.5 (L) 13.0 - 17.0 g/dL   HCT 22.0 (L) 25.4 - 27.0 %   MCV 93.7 80.0 - 100.0 fL   MCH 31.6 26.0 - 34.0 pg   MCHC 33.8 30.0 - 36.0 g/dL   RDW 62.3 76.2 - 83.1 %   Platelets 223 150 - 400  K/uL   nRBC 0.0 0.0 - 0.2 %   Neutrophils Relative % 71 %   Neutro Abs 8.4 (H) 1.7 - 7.7 K/uL   Lymphocytes Relative 18 %   Lymphs Abs 2.2 0.7 - 4.0 K/uL   Monocytes Relative 7 %   Monocytes Absolute 0.8 0.1 - 1.0 K/uL   Eosinophils Relative 2 %   Eosinophils Absolute 0.3 0.0 - 0.5 K/uL   Basophils Relative 1 %   Basophils Absolute 0.1 0.0 - 0.1 K/uL   Immature Granulocytes 1 %   Abs Immature Granulocytes 0.06 0.00 - 0.07 K/uL  Comprehensive metabolic panel     Status: Abnormal   Collection Time: 11/04/20  9:20 AM  Result Value Ref Range   Sodium 128 (L) 135 - 145 mmol/L   Potassium 6.7 (HH) 3.5 - 5.1 mmol/L   Chloride 95 (L) 98 - 111 mmol/L   CO2 19 (L) 22 - 32 mmol/L   Glucose, Bld 221 (H) 70 - 99 mg/dL   BUN 39 (H) 8 - 23 mg/dL   Creatinine, Ser 5.17 (H) 0.61 - 1.24 mg/dL   Calcium 61.6 8.9 - 07.3 mg/dL   Total Protein 7.0 6.5 - 8.1 g/dL   Albumin 4.0 3.5 - 5.0 g/dL   AST 31 15 - 41 U/L   ALT 19 0 - 44 U/L   Alkaline Phosphatase 52 38 - 126 U/L   Total Bilirubin 0.9 0.3 - 1.2 mg/dL   GFR, Estimated 22 (L) >60 mL/min   Anion gap 14 5 - 15  Acetaminophen level     Status: Abnormal   Collection Time: 11/04/20  9:20 AM  Result Value Ref Range   Acetaminophen (Tylenol), Serum 73 (H) 10 - 30 ug/mL  Salicylate level     Status: Abnormal   Collection Time: 11/04/20  9:20 AM  Result Value Ref Range   Salicylate Lvl <7.0 (L) 7.0 - 30.0 mg/dL  Blood gas, arterial     Status: Abnormal   Collection Time: 11/04/20 10:20 AM  Result Value Ref Range   FIO2 0.80    Delivery systems VENTILATOR    Mode ASSIST CONTROL    VT 500 mL   LHR 16 resp/min   Peep/cpap 5.0 cm H20   pH, Arterial 7.24 (L) 7.350 - 7.450   pCO2 arterial 44 32.0 - 48.0 mmHg   pO2, Arterial 68 (L) 83.0 - 108.0 mmHg   Bicarbonate 18.9 (L) 20.0 - 28.0 mmol/L   Acid-base deficit 8.2 (H) 0.0 - 2.0 mmol/L   O2 Saturation 89.5 %  Patient temperature 37.0    Collection site LEFT RADIAL    Sample type ARTERIAL DRAW     Allens test (pass/fail) PASS PASS  Resp Panel by RT-PCR (Flu A&B, Covid) Urine, Clean Catch     Status: None   Collection Time: 11/04/20 10:38 AM   Specimen: Urine, Clean Catch; Nasopharyngeal(NP) swabs in vial transport medium  Result Value Ref Range   SARS Coronavirus 2 by RT PCR NEGATIVE NEGATIVE   Influenza A by PCR NEGATIVE NEGATIVE   Influenza B by PCR NEGATIVE NEGATIVE  Lactic acid, plasma     Status: Abnormal   Collection Time: 11/04/20 10:38 AM  Result Value Ref Range   Lactic Acid, Venous 2.6 (HH) 0.5 - 1.9 mmol/L  Protime-INR     Status: None   Collection Time: 11/04/20 10:39 AM  Result Value Ref Range   Prothrombin Time 14.3 11.4 - 15.2 seconds   INR 1.1 0.8 - 1.2  APTT     Status: None   Collection Time: 11/04/20 10:39 AM  Result Value Ref Range   aPTT 34 24 - 36 seconds  Blood gas, venous     Status: Abnormal   Collection Time: 11/04/20 10:42 AM  Result Value Ref Range   pH, Ven 7.25 7.250 - 7.430   pCO2, Ven 46 44.0 - 60.0 mmHg   pO2, Ven 48.0 (H) 32.0 - 45.0 mmHg   Bicarbonate 20.2 20.0 - 28.0 mmol/L   Acid-base deficit 6.9 (H) 0.0 - 2.0 mmol/L   O2 Saturation 75.8 %   Patient temperature 37.0    Collection site VEIN    Sample type VENOUS    ____________________________________________  EKG My review and personal interpretation at Time: 9:25   Indication: ams  Rate: 50  Rhythm: sinus brady Axis: normal Other: normal intervals, no stemi ____________________________________________  RADIOLOGY  I personally reviewed all radiographic images ordered to evaluate for the above acute complaints and reviewed radiology reports and findings.  These findings were personally discussed with the patient.  Please see medical record for radiology report.  ____________________________________________   PROCEDURES  Procedure(s) performed:  .Critical Care Performed by: Willy Eddy, MD Authorized by: Willy Eddy, MD   Critical care provider  statement:    Critical care time (minutes):  45   Critical care time was exclusive of:  Separately billable procedures and treating other patients   Critical care was necessary to treat or prevent imminent or life-threatening deterioration of the following conditions:  Renal failure and CNS failure or compromise   Critical care was time spent personally by me on the following activities:  Development of treatment plan with patient or surrogate, discussions with consultants, evaluation of patient's response to treatment, examination of patient, obtaining history from patient or surrogate, ordering and performing treatments and interventions, ordering and review of laboratory studies, ordering and review of radiographic studies, pulse oximetry, re-evaluation of patient's condition and review of old charts Procedure Name: Intubation Date/Time: 11/04/2020 10:48 AM Performed by: Willy Eddy, MD Pre-anesthesia Checklist: Patient identified, Emergency Drugs available, Suction available and Patient being monitored Oxygen Delivery Method: Nasal cannula Preoxygenation: Pre-oxygenation with 100% oxygen Induction Type: IV induction and Rapid sequence Ventilation: Mask ventilation without difficulty Laryngoscope Size: Glidescope and 4 Grade View: Grade III Tube size: 7.5 mm Number of attempts: 1 Airway Equipment and Method: Video-laryngoscopy Placement Confirmation: ETT inserted through vocal cords under direct vision, CO2 detector, Breath sounds checked- equal and bilateral and Positive ETCO2 Secured at: 23 cm Tube secured with: ETT  holder Dental Injury: Teeth and Oropharynx as per pre-operative assessment        Critical Care performed: yes ____________________________________________   INITIAL IMPRESSION / ASSESSMENT AND PLAN / ED COURSE  Pertinent labs & imaging results that were available during my care of the patient were reviewed by me and considered in my medical decision making  (see chart for details).   DDX: Dehydration, sepsis, pna, uti, hypoglycemia, cva, drug effect, withdrawal, encephalitis  EMMITT MATTHEWS is a 70 y.o. who presents to the ED with [place] as described above.  Patient critically ill-appearing.  Given concern for possible overdose of methadone and who is placed on supplemental oxygen attached to cardiac monitor and given additional dose of IV Narcan.  After few minutes with no response the patient still with altered mental status and absent gag reflex we proceeded with intubation for airway protection.  He is hypotensive tensive persistently despite IV fluids.  Blood work sent with above differential.  Given empiric antibiotics due to concern for possible sepsis but I have a higher suspicion for probable aspiration based on his presentation.  Will order CT imaging.  Clinical Course as of 11/04/20 1238  Thu Nov 04, 2020  1002 Patient acutely hyperkalemic.  Does not have peaked T waves or prolonged QT on EKG we will temporize with IV calcium IV bicarb, albuterol IV insulin. [PR]  1105 Patient's Tylenol level is 73.  Uncertain time of possible ingestion and therefore will repeat in 4 hours to make sure it is not increasing. [PR]  1137 CT head by my review does not show any evidence of IPH or SDH.  Patient will be admitted to ICU. [PR]    Clinical Course User Index [PR] Willy Eddy, MD    The patient was evaluated in Emergency Department today for the symptoms described in the history of present illness. He/she was evaluated in the context of the global COVID-19 pandemic, which necessitated consideration that the patient might be at risk for infection with the SARS-CoV-2 virus that causes COVID-19. Institutional protocols and algorithms that pertain to the evaluation of patients at risk for COVID-19 are in a state of rapid change based on information released by regulatory bodies including the CDC and federal and state organizations. These policies  and algorithms were followed during the patient's care in the ED.  As part of my medical decision making, I reviewed the following data within the electronic MEDICAL RECORD NUMBER Nursing notes reviewed and incorporated, Labs reviewed, notes from prior ED visits and [place] Controlled Substance Database   ____________________________________________   FINAL CLINICAL IMPRESSION(S) / ED DIAGNOSES  Final diagnoses:  Unresponsive  Acute hyperkalemia  Hypotension, unspecified hypotension type      NEW MEDICATIONS STARTED DURING THIS VISIT:  New Prescriptions   No medications on file     Note:  This document was prepared using Dragon voice recognition software and may include unintentional dictation errors.    Willy Eddy, MD 11/04/20 [phone]

## 2020-11-04 NOTE — Progress Notes (Signed)
Pt was transported to CT and back to the ED while on the vent.

## 2020-11-04 NOTE — Progress Notes (Signed)
1715 patient arrived from ER no verbal report given pt on levo stopped per orders of MAP. Pt on prop 15 mcg foley in place ETT at 23 og tube at 55 og advanced per MD then ok to use. Boius and IV fluids started as previous ordered. Pt hypothermic warming blanket placed on patient

## 2020-11-04 NOTE — ED Notes (Addendum)
20 mg of Etomidate  100 mg of Rocuronium given   Pt being BVM'ed

## 2020-11-04 NOTE — ED Notes (Signed)
Gold watch with iridescent face given to daughter in law.

## 2020-11-05 ENCOUNTER — Encounter: Payer: Self-pay | Admitting: Pulmonary Disease

## 2020-11-05 DIAGNOSIS — T50901S Poisoning by unspecified drugs, medicaments and biological substances, accidental (unintentional), sequela: Secondary | ICD-10-CM

## 2020-11-05 DIAGNOSIS — N179 Acute kidney failure, unspecified: Secondary | ICD-10-CM | POA: Diagnosis not present

## 2020-11-05 DIAGNOSIS — J9601 Acute respiratory failure with hypoxia: Secondary | ICD-10-CM | POA: Diagnosis not present

## 2020-11-05 LAB — COMPREHENSIVE METABOLIC PANEL
ALT: 42 U/L (ref 0–44)
AST: 117 U/L — ABNORMAL HIGH (ref 15–41)
Albumin: 3.2 g/dL — ABNORMAL LOW (ref 3.5–5.0)
Alkaline Phosphatase: 45 U/L (ref 38–126)
Anion gap: 7 (ref 5–15)
BUN: 33 mg/dL — ABNORMAL HIGH (ref 8–23)
CO2: 22 mmol/L (ref 22–32)
Calcium: 9 mg/dL (ref 8.9–10.3)
Chloride: 104 mmol/L (ref 98–111)
Creatinine, Ser: 1.83 mg/dL — ABNORMAL HIGH (ref 0.61–1.24)
GFR, Estimated: 39 mL/min — ABNORMAL LOW (ref >60)
Glucose, Bld: 93 mg/dL (ref 70–99)
Potassium: 5.5 mmol/L — ABNORMAL HIGH (ref 3.5–5.1)
Sodium: 133 mmol/L — ABNORMAL LOW (ref 135–145)
Total Bilirubin: 0.9 mg/dL (ref 0.3–1.2)
Total Protein: 6.2 g/dL — ABNORMAL LOW (ref 6.5–8.1)

## 2020-11-05 LAB — PHOSPHORUS: Phosphorus: 2.7 mg/dL (ref 2.5–4.6)

## 2020-11-05 LAB — GLUCOSE, CAPILLARY
Glucose-Capillary: 145 mg/dL — ABNORMAL HIGH (ref 70–99)
Glucose-Capillary: 81 mg/dL (ref 70–99)
Glucose-Capillary: 73 mg/dL (ref 70–99)
Glucose-Capillary: 72 mg/dL (ref 70–99)
Glucose-Capillary: 129 mg/dL — ABNORMAL HIGH (ref 70–99)
Glucose-Capillary: 119 mg/dL — ABNORMAL HIGH (ref 70–99)

## 2020-11-05 LAB — CBC
HCT: 35.4 % — ABNORMAL LOW (ref 39.0–52.0)
Hemoglobin: 11.7 g/dL — ABNORMAL LOW (ref 13.0–17.0)
MCH: 30.3 pg (ref 26.0–34.0)
MCHC: 33.1 g/dL (ref 30.0–36.0)
MCV: 91.7 fL (ref 80.0–100.0)
Platelets: 207 10*3/uL (ref 150–400)
RBC: 3.86 MIL/uL — ABNORMAL LOW (ref 4.22–5.81)
RDW: 13.2 % (ref 11.5–15.5)
WBC: 15.8 10*3/uL — ABNORMAL HIGH (ref 4.0–10.5)
nRBC: 0 % (ref 0.0–0.2)

## 2020-11-05 LAB — CK: Total CK: 4065 U/L — ABNORMAL HIGH (ref 49–397)

## 2020-11-05 LAB — MAGNESIUM: Magnesium: 1.9 mg/dL (ref 1.7–2.4)

## 2020-11-05 LAB — HEMOGLOBIN A1C
Hgb A1c MFr Bld: 6.2 % — ABNORMAL HIGH (ref 4.8–5.6)
Mean Plasma Glucose: 131 mg/dL

## 2020-11-05 LAB — PROCALCITONIN: Procalcitonin: <0.1 ng/mL

## 2020-11-05 LAB — T4, FREE: Free T4: 0.84 ng/dL (ref 0.61–1.12)

## 2020-11-05 LAB — POTASSIUM: Potassium: 4.5 mmol/L (ref 3.5–5.1)

## 2020-11-05 LAB — TSH: TSH: 0.475 u[IU]/mL (ref 0.350–4.500)

## 2020-11-05 MED ORDER — DOCUSATE SODIUM 50 MG/5ML PO LIQD
100.0000 mg | Freq: Two times a day (BID) | ORAL | Status: DC | PRN
  Administered 2020-11-07 – 2020-11-08 (×2): 100 mg via ORAL
  Filled 2020-11-05 (×3): qty 10

## 2020-11-05 MED ORDER — DEXMEDETOMIDINE HCL IN NACL 400 MCG/100ML IV SOLN
0.4000 ug/kg/h | INTRAVENOUS | Status: DC
  Administered 2020-11-05: 0.4 ug/kg/h via INTRAVENOUS
  Filled 2020-11-05: qty 100

## 2020-11-05 MED ORDER — FENTANYL CITRATE (PF) 100 MCG/2ML IJ SOLN
25.0000 ug | INTRAMUSCULAR | Status: DC | PRN
Start: 2020-11-05 — End: 2020-11-05
  Administered 2020-11-05: 25 ug via INTRAVENOUS
  Filled 2020-11-05: qty 2

## 2020-11-05 MED ORDER — THIAMINE HCL 100 MG/ML IJ SOLN
100.0000 mg | Freq: Every day | INTRAMUSCULAR | Status: DC
  Administered 2020-11-05 – 2020-11-07 (×3): 100 mg via INTRAVENOUS
  Filled 2020-11-05 (×3): qty 2

## 2020-11-05 MED ORDER — SODIUM ZIRCONIUM CYCLOSILICATE 5 G PO PACK
10.0000 g | PACK | Freq: Every day | ORAL | Status: DC
  Administered 2020-11-05: 10 g
  Filled 2020-11-05: qty 2

## 2020-11-05 MED ORDER — INSULIN ASPART 100 UNIT/ML IJ SOLN
0.0000 [IU] | Freq: Three times a day (TID) | INTRAMUSCULAR | Status: DC
Start: 2020-11-06 — End: 2020-11-09
  Administered 2020-11-07: 5 [IU] via SUBCUTANEOUS
  Administered 2020-11-07: 2 [IU] via SUBCUTANEOUS
  Administered 2020-11-07: 3 [IU] via SUBCUTANEOUS
  Administered 2020-11-08 (×2): 2 [IU] via SUBCUTANEOUS
  Filled 2020-11-05 (×7): qty 1

## 2020-11-05 NOTE — Progress Notes (Signed)
Placed into SBT per NP request. Tol well.

## 2020-11-05 NOTE — Progress Notes (Signed)
Patient extubated per Dr. Marcos Eke order. Patient now, able to talk & resting comfortably, on 2L nasal cannula. Oxygen saturation 93% at this time.

## 2020-11-05 NOTE — Progress Notes (Addendum)
0800 patient sedated on vent unable to make needs known IV removed infiltrated from right forearm Decreased prop to see if patient would follow commands 1000 prop increased pt following some commands very agitated swinging legs out bed increase in resp rate  1030 wife at bedside updated on plan of care 1130 precedex started and weaning prop off  1310 prop off completely patient nods head yes to pain PRN fentanyl given, precedex on 1345 patient following commands and nodding  1400 patient reports pain medication effective wife remains at bedside with patient plan is to wean patient as tolerates    1440 patient started on a wean 1510 orders to extubate patient alert following commands able to make needs known.  1516 pt extubated to 2L [place] tolerating well

## 2020-11-05 NOTE — Progress Notes (Signed)
Initial Nutrition Assessment  DOCUMENTATION CODES:   Obesity unspecified  INTERVENTION:   If tube feeds initiated, recommend:  Vital HP @20ml /hr + ProSource TF 66ml QID  Propofol: 29.49ml/hr- provides 771kcal/day   Free water flushes 24ml q4 hours to maintain tube patency   Regimen provides 1571kcal/day, 130g/day protein and 569ml/day free water   Recommend liquid MVI daily via tube  Pt at high refeed risk; recommend monitor potassium, magnesium and phosphorus labs daily until stable  NUTRITION DIAGNOSIS:   Inadequate oral intake related to inability to eat (pt sedated and ventilated) as evidenced by NPO status.  GOAL:   Provide needs based on ASPEN/SCCM guidelines  MONITOR:   Vent status, Labs, Weight trends, Skin, I & O's  REASON FOR ASSESSMENT:   Ventilator    ASSESSMENT:   70 year old male admitted with acute metabolic encephalopathy and acute hypoxic respiratory failure in the setting of suspected drug overdose (polysubstance including methadone) requiring intubation in the ED for airway protection.  Patient also with h/o OSA, sarcoidosis and fibromyalgia.  RD working remotely.  Pt sedated and ventilated. OGT in place. Plan is for possible extubation today. Will plan to start tube feeds if pt does not extubate. Per chart, pt appears weight stable pta.   Medications reviewed and include: pepcid, heparin, insulin, lokelma, thiamine, ceftriaxone, precedex, metronidazole, propofol  Labs reviewed: Na 133(L), K 5.5(H), BUN 33(H), creat 1.83(H) Wbc- 15.8(H) Cbgs- 81, 73, 72 x 24 hrs  Patient is currently intubated on ventilator support MV: 8.3 L/min Temp (24hrs), Avg:97.4 F (36.3 C), Min:93.4 F (34.1 C), Max:99 F (37.2 C)  Propofol: 29.64ml/hr- provides 771kcal/day   MAP- >58mmHg   UOP- 77m   NUTRITION - FOCUSED PHYSICAL EXAM: Unable to perform at this time   Diet Order:   Diet Order             Diet NPO time specified  Diet effective now                   EDUCATION NEEDS:   No education needs have been identified at this time  Skin:  Skin Assessment: Reviewed RN Assessment (ecchymosis)  Last BM:  pta  Height:   Ht Readings from Last 1 Encounters:  11/04/20 5\' 11"  (1.803 m)    Weight:   Wt Readings from Last 1 Encounters:  11/05/20 101.8 kg    Ideal Body Weight:  78.2 kg  BMI:  Body mass index is 31.3 kg/m.  Estimated Nutritional Needs:   Kcal:  1119-1425kcal/day  Protein:  >156g/day  Fluid:  2.0-2.3L/day  MS, RD, LDN Please refer to [facility] for RD and/or RD on-call/weekend/after hours pager

## 2020-11-05 NOTE — Progress Notes (Signed)
NAME:  Ethan Frye, MRN:  [id number], DOB:  1950-05-15, LOS: 1 ADMISSION DATE:  11/04/2020, CONSULTATION DATE:  11/04/2020 REFERRING MD:  Dr. Quentin Cornwall, CHIEF COMPLAINT:  Altered Mental Status   Brief Pt Description / Synopsis:  70 year old male admitted with acute metabolic encephalopathy and acute hypoxic respiratory failure in the setting of suspected drug overdose (polysubstance including methadone) requiring intubation in the ED for airway protection.  Patient also with acute kidney injury, hyperkalemia, and concern for aspiration.  History of Present Illness:  Ethan Frye is a 70 year old with an extensive past medical history as listed below, who presented to [facility] ED on 11/04/2020 due to unresponsiveness.  Patient is currently critically ill, intubated and sedated, therefore history is obtained from patient's daughter-in-law and ED and nursing notes.  His daughter-in-law found him the morning of 11/04/20 unresponsive, with multiple pill bottles and pills scattered on his nightstand (bottles included methadone, clonazepam, Lyrica, Tramadol).  She reports he has been confused for approximately 2 months which she suspects is due to his Lyrica and clonazepam.  He was started on methadone last Saturday 10/30/20 (supposed to discontinue Lyrica and clonazepam), and reports he has been more confused than usual since starting the methadone.  She checked on him at approximately 1:00 am this morning and he was sleeping soundly, and no pill bottles were noted on the nightstand.  He received 8 mg of Narcan via EMS without improvement.  ED Course: Upon arrival to the ED he had spontaneous respirations, but was very somnolent, and was also hypotensive.  He was given additional dose of Narcan without response.  He remained altered with GCS of 6 and absent gag reflexes, thus he was subsequently intubated by the ED provider for airway protection.  There was high suspicion for probable aspiration,  therefore sepsis work-up was initiated.  He was given 2 L of IV fluids along with IV azithromycin and ceftriaxone. EKG did not exhibit peaked T waves or prolonged QT, however given his hyperkalemia he was given albuterol, calcium gluconate, insulin and D50 as temporizing measures.  PCCM was asked to admit the patient to ICU for further work-up and treatment of acute metabolic encephalopathy and acute hypoxic respiratory failure in the setting of suspected drug overdose requiring intubation in the ED for airway protection.  Patient also with acute kidney injury and hyperkalemia, and concern for aspiration.  Pertinent  Medical History  Obstructive sleep apnea not on CPAP due to intolerance Pulmonary sarcoidosis Stenosis of left bronchus Asthma Rheumatoid arthritis Coronary artery disease NSTEMI Hypertension Fibromyalgia BPH GERD Obesity Diabetes mellitus  Micro Data:  11/04/2020: SARS-CoV-2 and influenza PCR>> negative 11/04/2020: Blood culture>> NGTD 11/04/2020: Tracheal aspirate>> moderate GPC on prelim  Antimicrobials:  Azithromycin x1 dose 9/22 Ceftriaxone 9/22>> Flagyl 9/22>>  Significant Hospital Events: Including procedures, antibiotic start and stop dates in addition to other pertinent events   11/04/2020: Presented to ED required in intubation for airway protection.  Concern for aspiration.  PCCM asked to admit 11/05/20: Remains intubated, on propofol and off levophed. Following some commands.  Interim History / Subjective:  Remains intubated, on propofol and levophed. Turns head to verbal stimulation but not opening eyes or following commands. Moving all extremities on sedation with bilateral mitts in place.   Objective   Blood pressure (!) 100/55, pulse (!) 57, temperature 98.1 F (36.7 C), temperature source Bladder, resp. rate 16, height _0  (1.803 m), weight 101.8 kg, SpO2 100 %.    Vent Mode: PRVC FiO2 (%):  [40 %-  80 %] 40 % Set Rate:  [16 bmp] 16 bmp Vt Set:   [500 mL] 500 mL PEEP:  [5 cmH20] 5 cmH20   Intake/Output Summary (Last 24 hours) at 11/05/2020 3545 Last data filed at 11/05/2020 0747 Gross per 24 hour  Intake 3899.34 ml  Output 1355 ml  Net 2544.34 ml   Filed Weights   11/04/20 1045 11/04/20 1715 11/05/20 0447  Weight: 97.7 kg 97.5 kg 101.8 kg    Examination: General: Acute on chronically ill-appearing male, laying in bed, intubated and sedated, no acute distress HENT: Atraumatic, normocephalic, neck supple, no JVD Lungs: Coarse breath sounds with expiratory wheezing throughout, ventilator assisted, even, nonlabored Cardiovascular: Bradycardia, regular rhythm, S1-S2, no murmurs rubs or gallops Abdomen: Obese, soft, nontender, nondistended, no guarding rebound tenderness, bowel sounds positive x4 Extremities: No deformities, no edema Neuro: Sedated, follows some commands, pupils PERRLA, turns head to voice, moving all extremities GU: Foley catheter in place draining dark yellow urine  Resolved Hospital Problem list     Assessment & Plan:   Acute Hypoxic Respiratory Failure in setting of Drug overdose and suspected Aspiration PMHx of Pulmonary Sarcoidosis, OSA not on BiPAP for last 2 years due to intolerance, Asthma -Full vent support, implement lung protective strategies -Plateau pressures less than 30 cm H20 -Wean FiO2 & PEEP as tolerated to maintain O2 sats >92% -Follow intermittent Chest X-ray & ABG as needed -Spontaneous Breathing Trials when respiratory parameters met and mental status permits -Implement VAP Bundle -Bronchodilators & Budesonide nebs -IV Solumedrol 40 mg daily - wean to DC -ABX as above, monitor tracheal aspirate cultures  Hypotension, ? Septic shock due to Aspiration vs. Sedation related PMHx of CAD, NSTEMI, Hypertension -Continuous cardiac monitoring -Maintain MAP >65 -IV fluids (received 3.5L IVF boluses in ED 11/04/20) -Vasopressors as needed to maintain MAP goal - off levophed gtt after IV  infiltrated and maintain BP without -Trend lactic acid until normalized -Consider Echocardiogram   Concern for Aspiration -Monitor fever curve -Trend WBC's & Procalcitonin -Follow cultures as above -Continue empiric Ceftriaxone & Flagyl pending cultures & sensitivities  Acute Metabolic Encephalopathy in setting of suspected Drug Overdose (Polysubstance) & multiple metabolic derangements Sedation needs in setting of mechanical ventilation PMHx of Fibromyalgia, chronic pain -Maintain a RASS goal of 0 to -1 -Propofol as needed to maintain RASS goal - switch to precedex for agitation control -Avoid sedating medications as able -Daily wake up assessment - Add Thiamin daily - Check TSH, free T4 -CT Head 11/04/20 negative -Urine drug screen positive for Benzodiazepines, Methadone, Opiates, and Tricyclics  Drug Overdose (Polysubstance: Methadone, benzodiazepines, opiates, tricyclics) Elevated Serum Tylenol (serum level 73 at admission) Chronic pain with polysubstance dependence Fibromyalgia - Add Fentanyl IV 36mg q1h prn for pain control -Cardiac monitoring -Serial EKG's to follow QTc -Support ABC's -Follow BMP -Serum tylenol trending down on labs 11/04/20 -LFTs currently normal, will trend  Acute Kidney Injury Rhabdomyolysis Hyperkalemia Hyponatremia PMHx of BPH -Monitor I&O's / urinary output -Follow BMP -Ensure adequate renal perfusion -Avoid nephrotoxic agents as able -Replace electrolytes as indicated -IV Fluids -Follow CK  -Renal [place]/22/22: normal kidneys, no hydronephrosis or renal cortical thinning; bilateral benign-appearing renal cysts, bladder collapse around foley catheter -Received temporizing measures in ED (albuterol, calcium gluconate, insulin plus D50)   Anemia without s/sx of overt bleeding -Monitor for S/Sx of bleeding -Trend CBC -Heparin SQ for VTE Prophylaxis  -Transfuse for Hgb <7  Diabetes Mellitus -CBG's q4h; Target range of 140 to  180 -SSI -Follow ICU Hypo/Hyperglycemia  protocol -Check Hgb A1c - pending   Best Practice (right click and "Reselect all SmartList Selections" daily)   Diet/type: NPO DVT prophylaxis: prophylactic heparin  GI prophylaxis: H2B Lines: N/A Foley:  Yes, and it is still needed Code Status:  full code Last date of multidisciplinary goals of care discussion [11/05/20]   Labs   CBC: Recent Labs  Lab 11/04/20 0920 11/05/20 0409  WBC 11.8* 15.8*  NEUTROABS 8.4*  --   HGB 12.5* 11.7*  HCT 37.0* 35.4*  MCV 93.7 91.7  PLT 223 944    Basic Metabolic Panel: Recent Labs  Lab 11/04/20 0920 11/04/20 1759 11/05/20 0409  NA 128* 133* 133*  K 6.7* 5.0 5.5*  CL 95* 106 104  CO2 19* 19* 22  GLUCOSE 221* 159* 93  BUN 39* 38* 33*  CREATININE 2.95* 2.37* 1.83*  CALCIUM 10.2 9.3 9.0   GFR: Estimated Creatinine Clearance: 46.3 mL/min (A) (by C-G formula based on SCr of 1.83 mg/dL (H)). Recent Labs  Lab 11/04/20 0920 11/04/20 1038 11/04/20 1254 11/04/20 1524 11/04/20 1759 11/05/20 0409  PROCALCITON  --   --  <0.10  --   --  <0.10  WBC 11.8*  --   --   --   --  15.8*  LATICACIDVEN  --  2.6* 3.0* 2.0* 2.2*  --     Liver Function Tests: Recent Labs  Lab 11/04/20 0920 11/05/20 0409  AST 31 117*  ALT 19 42  ALKPHOS 52 45  BILITOT 0.9 0.9  PROT 7.0 6.2*  ALBUMIN 4.0 3.2*   No results for input(s): LIPASE, AMYLASE in the last 168 hours. No results for input(s): AMMONIA in the last 168 hours.  ABG    Component Value Date/Time   PHART 7.34 (L) 11/05/2020 0500   PCO2ART 37 11/05/2020 0500   PO2ART 148 (H) 11/05/2020 0500   HCO3 20.0 11/05/2020 0500   ACIDBASEDEF 5.2 (H) 11/05/2020 0500   O2SAT 99.2 11/05/2020 0500     Coagulation Profile: Recent Labs  Lab 11/04/20 1039  INR 1.1    Cardiac Enzymes: Recent Labs  Lab 11/04/20 1254 11/05/20 0409  CKTOTAL 1,950* 4,065*    HbA1C: No results found for: HGBA1C  CBG: Recent Labs  Lab 11/04/20 1317  11/04/20 1935 11/04/20 2329 11/05/20 0326 11/05/20 0919  GLUCAP 144* 148* 129* 81 73    Review of Systems:   Unable to assess due to AMS, intubation, sedation  Allergies Allergies  Allergen Reactions   Penicillins Rash   Ciprofloxacin     Other reaction(s): Dizziness Pt reports he took one dose and fainted, fell and hurt his leg   Azithromycin      Home Medications  Prior to Admission medications   Medication Sig Start Date End Date Taking? Authorizing Provider  acidophilus (RISAQUAD) CAPS capsule Take 1 capsule by mouth daily.   Yes [provider]  albuterol (PROVENTIL) (2.5 MG/3ML) 0.083% nebulizer solution Take 2.5 mg by nebulization every 6 (six) hours as needed for wheezing or shortness of breath.   Yes [provider]  albuterol (VENTOLIN HFA) 108 (90 Base) MCG/ACT inhaler Inhale 2 puffs into the lungs every 6 (six) hours as needed for wheezing or shortness of breath.   Yes [provider]  aspirin EC 81 MG tablet Take 81 mg by mouth daily. Swallow whole.   Yes [provider]  atorvastatin (LIPITOR) 20 MG tablet Take 20 mg by mouth daily. 09/14/20  Yes [provider]  azithromycin (ZITHROMAX) 500  MG tablet Take 500 mg by mouth every Monday, Wednesday, and Friday. 09/29/20  Yes [provider]  carvedilol (COREG) 25 MG tablet Take 25 mg by mouth 2 (two) times daily. 08/16/20  Yes [provider]  celecoxib (CELEBREX) 200 MG capsule Take 200 mg by mouth daily as needed for pain. 07/15/20  Yes [provider]  cetirizine (ZYRTEC) 10 MG tablet Take 10 mg by mouth daily.   Yes [provider]  cholecalciferol (VITAMIN D) 25 MCG (1000 UNIT) tablet Take 1,000 Units by mouth daily.   Yes [provider]  clonazePAM (KLONOPIN) 1 MG tablet Take 1 mg by mouth 2 (two) times daily.   Yes [provider]  diclofenac Sodium (VOLTAREN) 1 % GEL Apply 2 g topically 4 (four) times daily.   Yes  [provider]  doxepin (SINEQUAN) 50 MG capsule Take 150 mg by mouth at bedtime. 10/21/20  Yes [provider]  DULoxetine (CYMBALTA) 60 MG capsule Take 60 mg by mouth daily. 09/01/20  Yes [provider]  ferrous sulfate 325 (65 FE) MG tablet Take 325 mg by mouth daily with breakfast.   Yes [provider]  fluticasone-salmeterol (ADVAIR) 500-50 MCG/ACT AEPB Inhale 1 puff into the lungs every 12 (twelve) hours.   Yes [provider]  HYDROcodone-acetaminophen (NORCO/VICODIN) 5-325 MG tablet Take 1 tablet by mouth every 4 (four) hours as needed for pain. 10/07/20  Yes [provider]  lisinopril (ZESTRIL) 40 MG tablet Take 40 mg by mouth daily. 10/02/20  Yes [provider]  metFORMIN (GLUCOPHAGE) 500 MG tablet Take 500-1,000 mg by mouth 2 (two) times daily with a meal. 08/04/20  Yes [provider]  methadone (DOLOPHINE) 5 MG tablet Take 2.5-5 mg by mouth 2 (two) times daily. Take 2.5 mg (one-half tablet) twice daily for one week then increase to 5 mg (one tablet) twice daily, thereafter 10/27/20  Yes [provider]  montelukast (SINGULAIR) 10 MG tablet Take 10 mg by mouth daily. 09/16/20  Yes [provider]  Multiple Vitamins-Minerals (MULTIVITAMIN WITH MINERALS) tablet Take 1 tablet by mouth daily.   Yes [provider]  nitrofurantoin, macrocrystal-monohydrate, (MACROBID) 100 MG capsule Take 100 mg by mouth every Monday, Wednesday, and Friday.   Yes [provider]  omeprazole (PRILOSEC) 40 MG capsule Take 40 mg by mouth daily. 09/08/20  Yes [provider]  pimecrolimus (ELIDEL) 1 % cream Apply 1 application topically 2 (two) times daily.   Yes [provider]  polyethylene glycol (MIRALAX / GLYCOLAX) 17 g packet Take 17 g by mouth 2 (two) times daily.   Yes [provider]  pregabalin (LYRICA) 75 MG capsule Take 75 mg by mouth 3 (three) times daily. 10/21/20  Yes  [provider]  senna-docusate (SENOKOT-S) 8.6-50 MG tablet Take 1 tablet by mouth daily.   Yes [provider]  spironolactone (ALDACTONE) 25 MG tablet Take 12.5 mg by mouth daily. 07/15/20  Yes [provider]  tiZANidine (ZANAFLEX) 4 MG tablet Take 4 mg by mouth 3 (three) times daily. 09/01/20  Yes [provider]  traMADol (ULTRAM) 50 MG tablet Take 50 mg by mouth every 6 (six) hours as needed for pain.   Yes [provider]  vitamin B-12 (CYANOCOBALAMIN) 1000 MCG tablet Take 1,000 mcg by mouth daily.   Yes [provider]  nystatin (MYCOSTATIN) 100000 UNIT/ML suspension Take 2 mLs by mouth in the morning, at noon, in the evening, and at bedtime. 06/22/20  [provider]  SUMAtriptan (IMITREX) 50 MG tablet Take 50 mg by mouth as directed. 10/16/20   [provider]     Critical care time: 55 minutes     Enid Cutter, PA-S [place] DPAS

## 2020-11-05 NOTE — Consult Note (Signed)
PHARMACY CONSULT NOTE - FOLLOW UP  Pharmacy Consult for Electrolyte Monitoring and Replacement   Recent Labs: Potassium (mmol/L)  Date Value  11/05/2020 5.5 (H)   Calcium (mg/dL)  Date Value  40/37/5436 9.0   Albumin (g/dL)  Date Value  06/77/0340 3.2 (L)   Sodium (mmol/L)  Date Value  11/05/2020 133 (L)     Assessment: Patient admitted with acute respiratory failure likely in the setting of unintentional drug overdose. Pharmacy was consulted for electrolyte replacement.  Goal of Therapy:  Electrolytes WNL  Plan:  --Potassium 5.5 - currently on Lokelma daily --Continue to follow along  Pricilla Riffle, PharmD, BCPS Clinical Pharmacist 11/05/2020 11:59 AM

## 2020-11-06 DIAGNOSIS — G9340 Encephalopathy, unspecified: Secondary | ICD-10-CM

## 2020-11-06 DIAGNOSIS — T50901D Poisoning by unspecified drugs, medicaments and biological substances, accidental (unintentional), subsequent encounter: Secondary | ICD-10-CM | POA: Diagnosis not present

## 2020-11-06 DIAGNOSIS — J9621 Acute and chronic respiratory failure with hypoxia: Secondary | ICD-10-CM | POA: Diagnosis not present

## 2020-11-06 LAB — COMPREHENSIVE METABOLIC PANEL
ALT: 43 U/L (ref 0–44)
AST: 133 U/L — ABNORMAL HIGH (ref 15–41)
Albumin: 3.5 g/dL (ref 3.5–5.0)
Alkaline Phosphatase: 45 U/L (ref 38–126)
Anion gap: 9 (ref 5–15)
BUN: 21 mg/dL (ref 8–23)
CO2: 24 mmol/L (ref 22–32)
Calcium: 9.2 mg/dL (ref 8.9–10.3)
Chloride: 105 mmol/L (ref 98–111)
Creatinine, Ser: 1.18 mg/dL (ref 0.61–1.24)
GFR, Estimated: >60 mL/min (ref >60)
Glucose, Bld: 91 mg/dL (ref 70–99)
Potassium: 4.3 mmol/L (ref 3.5–5.1)
Sodium: 138 mmol/L (ref 135–145)
Total Bilirubin: 0.9 mg/dL (ref 0.3–1.2)
Total Protein: 6.5 g/dL (ref 6.5–8.1)

## 2020-11-06 LAB — CBC
HCT: 36.8 % — ABNORMAL LOW (ref 39.0–52.0)
Hemoglobin: 12.2 g/dL — ABNORMAL LOW (ref 13.0–17.0)
MCH: 30.3 pg (ref 26.0–34.0)
MCHC: 33.2 g/dL (ref 30.0–36.0)
MCV: 91.3 fL (ref 80.0–100.0)
Platelets: 239 10*3/uL (ref 150–400)
RBC: 4.03 MIL/uL — ABNORMAL LOW (ref 4.22–5.81)
RDW: 13.2 % (ref 11.5–15.5)
WBC: 13.4 10*3/uL — ABNORMAL HIGH (ref 4.0–10.5)
nRBC: 0 % (ref 0.0–0.2)

## 2020-11-06 LAB — PHOSPHORUS: Phosphorus: 2.2 mg/dL — ABNORMAL LOW (ref 2.5–4.6)

## 2020-11-06 LAB — GLUCOSE, CAPILLARY
Glucose-Capillary: 99 mg/dL (ref 70–99)
Glucose-Capillary: 117 mg/dL — ABNORMAL HIGH (ref 70–99)
Glucose-Capillary: 123 mg/dL — ABNORMAL HIGH (ref 70–99)
Glucose-Capillary: 144 mg/dL — ABNORMAL HIGH (ref 70–99)

## 2020-11-06 LAB — CK: Total CK: 3963 U/L — ABNORMAL HIGH (ref 49–397)

## 2020-11-06 LAB — MAGNESIUM: Magnesium: 1.6 mg/dL — ABNORMAL LOW (ref 1.7–2.4)

## 2020-11-06 LAB — PROCALCITONIN: Procalcitonin: <0.1 ng/mL

## 2020-11-06 MED ORDER — ALBUTEROL SULFATE (2.5 MG/3ML) 0.083% IN NEBU
2.5000 mg | INHALATION_SOLUTION | RESPIRATORY_TRACT | Status: DC | PRN
  Filled 2020-11-06: qty 3

## 2020-11-06 MED ORDER — VITAMIN D 25 MCG (1000 UNIT) PO TABS
1000.0000 [IU] | ORAL_TABLET | Freq: Every day | ORAL | Status: DC
  Administered 2020-11-06 – 2020-11-09 (×4): 1000 [IU] via ORAL
  Filled 2020-11-06 (×4): qty 1

## 2020-11-06 MED ORDER — ARFORMOTEROL TARTRATE 15 MCG/2ML IN NEBU
15.0000 ug | INHALATION_SOLUTION | Freq: Two times a day (BID) | RESPIRATORY_TRACT | Status: DC
  Administered 2020-11-06 – 2020-11-09 (×7): 15 ug via RESPIRATORY_TRACT
  Filled 2020-11-06 (×8): qty 2

## 2020-11-06 MED ORDER — HYDRALAZINE HCL 20 MG/ML IJ SOLN
10.0000 mg | INTRAMUSCULAR | Status: DC | PRN
  Administered 2020-11-07: 10 mg via INTRAVENOUS
  Filled 2020-11-06: qty 1

## 2020-11-06 MED ORDER — POLYETHYLENE GLYCOL 3350 17 G PO PACK
17.0000 g | PACK | Freq: Every day | ORAL | Status: DC | PRN
  Administered 2020-11-07 – 2020-11-08 (×2): 17 g via ORAL
  Filled 2020-11-06 (×2): qty 1

## 2020-11-06 MED ORDER — VITAMIN B-12 1000 MCG PO TABS
1000.0000 ug | ORAL_TABLET | Freq: Every day | ORAL | Status: DC
  Administered 2020-11-06 – 2020-11-09 (×4): 1000 ug via ORAL
  Filled 2020-11-06 (×4): qty 1

## 2020-11-06 MED ORDER — MAGNESIUM SULFATE 4 GM/100ML IV SOLN
4.0000 g | Freq: Once | INTRAVENOUS | Status: AC
  Administered 2020-11-06: 4 g via INTRAVENOUS
  Filled 2020-11-06: qty 100

## 2020-11-06 MED ORDER — SODIUM PHOSPHATES 45 MMOLE/15ML IV SOLN
15.0000 mmol | Freq: Once | INTRAVENOUS | Status: AC
  Administered 2020-11-06: 15 mmol via INTRAVENOUS
  Filled 2020-11-06: qty 5

## 2020-11-06 MED ORDER — OXYCODONE HCL 5 MG PO TABS: 5.0000 mg | ORAL_TABLET | ORAL | Status: DC | PRN

## 2020-11-06 MED ORDER — TIZANIDINE HCL 4 MG PO TABS
4.0000 mg | ORAL_TABLET | Freq: Three times a day (TID) | ORAL | Status: DC
  Administered 2020-11-06 – 2020-11-09 (×10): 4 mg via ORAL
  Filled 2020-11-06 (×13): qty 1

## 2020-11-06 MED ORDER — DULOXETINE HCL 30 MG PO CPEP
30.0000 mg | ORAL_CAPSULE | Freq: Every day | ORAL | Status: DC
  Administered 2020-11-06 – 2020-11-09 (×4): 30 mg via ORAL
  Filled 2020-11-06 (×4): qty 1

## 2020-11-06 MED ORDER — MELATONIN 5 MG PO TABS
5.0000 mg | ORAL_TABLET | Freq: Every evening | ORAL | Status: DC | PRN
  Administered 2020-11-06 – 2020-11-08 (×2): 5 mg via ORAL
  Filled 2020-11-06 (×2): qty 1

## 2020-11-06 NOTE — Evaluation (Signed)
Physical Therapy Evaluation Patient Details Name: Ethan Frye MRN: [id number] DOB: 1950/07/20 Today's Date: 11/06/2020  History of Present Illness  Pt admitted to [facility] on 11/04/20 for drug overdose, resulting in acute metabolic encephalopathy and acute hypoxic respiratory failure, requiring intubation for airway protection. Pt also with AKI, hyperkalemia, and concern for aspiration. Pt transferred to ICU. Significant PMH includes: OSA, pulmonary sarcoidosis, RA, CAD, hx NSTEMI, fibromyalgia, BPH, GERD, obesity, and DM.    Clinical Impression  Pt is a 70 year old M admitted to hospital on 11/04/20 for drug overdose, requiring intubation and transfer to ICU. At baseline, pt was Ind with ADL's, limited community ambulation with RW/SPC, and medication management; spouse to assist with driving and IADL's. Pt presents with mild impaired processing, functional weakness, decreased activity tolerance, and decreased gross balance, resulting in impaired functional mobility from baseline. Due to deficits, pt required supervision for safety with bed mobility, mod assist for transfers with RW, and CGA-min assist for gait in room with RW. Pt with improved balance and gait assist levels within session, however, current level of functional mobility continues to result in increased fall risk. Increased time/effort during session for toileting, pericare, and BLE therex. Deficits limit the pt's ability to safely and independently perform ADL's, transfer, and ambulate. Pt will benefit from acute skilled PT services to address deficits for return to baseline function. Deficits expected to improve with participation with acute PT services. Pt appropriate to ambulate with nursing in room; pt and RN verbalized understanding. At this time, PT recommends HHPT with RW, to address deficits and improve overall safety with functional mobility for return to baseline function and to decrease caregiver burden. Pt and spouse  agreeable.        Recommendations for follow up therapy are one component of a multi-disciplinary discharge planning process, led by the attending physician.  Recommendations may be updated based on patient status, additional functional criteria and insurance authorization.  Follow Up Recommendations Home health PT;Supervision for mobility/OOB    Equipment Recommendations  Rolling walker with 5" wheels       Precautions / Restrictions Precautions Precautions: Fall Restrictions Weight Bearing Restrictions: No      Mobility  Bed Mobility Overal bed mobility: Needs Assistance Bed Mobility: Supine to Sit;Sit to Supine     Supine to sit: Supervision;HOB elevated Sit to supine: Supervision   General bed mobility comments: Increased time/effort    Transfers Overall transfer level: Needs assistance Equipment used: Rolling walker (2 wheeled) Transfers: Sit to/from Stand Sit to Stand: Mod assist         General transfer comment: from EOB and commode with RW; verbal cues for hand placement/sequencing  Ambulation/Gait Ambulation/Gait assistance: Min guard;Min assist Gait Distance (Feet): 20 Feet (97ft x2)         General Gait Details: Initially min assist progressing to CGA for safety with RW. Verbal cues for RW proximity. Early reciprocal gait with slowed cadence.    Balance Overall balance assessment: Needs assistance Sitting-balance support: Feet supported;No upper extremity supported Sitting balance-Leahy Scale: Fair     Standing balance support: Bilateral upper extremity supported;During functional activity Standing balance-Leahy Scale: Fair Standing balance comment: Initially poor progressing to fair with RW            Pertinent Vitals/Pain Pain Assessment: No/denies pain    Home Living Family/patient expects to be discharged to:: Private residence Living Arrangements: Spouse/significant other Available Help at Discharge: Family;Available 24  hours/day Type of Home: House Home Access:  (threshold  step)     Home Layout: One level Home Equipment: Shower seat - built in;Walker - 4 wheels;Cane - quad;Transport chair      Prior Function Level of Independence: Independent with assistive device(s)         Comments: Spouse reports pt was Ind with ADL's, limited community ambulation with RW/SPC, and medication management. Pt able to drive but spouse typically drives and assists with IADL's. Pt and spouse are retired. Pt with hx of falls.        Extremity/Trunk Assessment   Upper Extremity Assessment Upper Extremity Assessment: Overall WFL for tasks assessed    Lower Extremity Assessment Lower Extremity Assessment: Overall WFL for tasks assessed;Generalized weakness (at least 3+/5 as pt was able to WB through extremities for transfers/gait; functional weakness noted, due to increased assist for STS transfers. sensation intact.)    Cervical / Trunk Assessment Cervical / Trunk Assessment: Normal  Communication   Communication: No difficulties  Cognition Arousal/Alertness: Awake/alert Behavior During Therapy: WFL for tasks assessed/performed Overall Cognitive Status: Within Functional Limits for tasks assessed         General Comments: A&O x4; increased time for processing      General Comments General comments (skin integrity, edema, etc.): mepilex at sacrum; mild edema noted to BLE/BUE. 2L O2 donned at beginning of session with SpO2 at 100%; able to be weaned to RA with maintenance of SpO2 >90%.    Exercises Other Exercises Other Exercises: Participated in bed mobility, transfers, and gait with RW. Increased assist for STS transfers. Improved balance with time/repetition. Able to perform x10 BLE therex including: ankle pumps, LAQ, marching, SAQ, heel slides, SAQ, and glute sets. Other Exercises: Educated regarding: PT role/POC, DC recommendations, safety with mobility, PT prognosis, and DME. Spouse and pt verbalized  understanding.   Assessment/Plan    PT Assessment Patient needs continued PT services  PT Problem List Decreased strength;Decreased activity tolerance;Decreased balance;Decreased mobility;Decreased safety awareness       PT Treatment Interventions Gait training;Stair training;Functional mobility training;Therapeutic activities;Therapeutic exercise;Balance training;Neuromuscular re-education    PT Goals (Current goals can be found in the Care Plan section)  Acute Rehab PT Goals Patient Stated Goal: go home PT Goal Formulation: With patient/family Time For Goal Achievement: 11/20/20 Potential to Achieve Goals: Good    Frequency Min 2X/week    AM-PAC PT "6 Clicks" Mobility  Outcome Measure Help needed turning from your back to your side while in a flat bed without using bedrails?: A Little Help needed moving from lying on your back to sitting on the side of a flat bed without using bedrails?: A Little Help needed moving to and from a bed to a chair (including a wheelchair)?: A Little Help needed standing up from a chair using your arms (e.g., wheelchair or bedside chair)?: A Lot Help needed to walk in hospital room?: A Little Help needed climbing 3-5 steps with a railing? : A Lot 6 Click Score: 16    End of Session Equipment Utilized During Treatment: Gait belt Activity Tolerance: Patient tolerated treatment well Patient left: in bed;with call bell/phone within reach;with chair alarm set;with nursing/sitter in room;with family/visitor present (bed in chair position) Nurse Communication: Mobility status PT Visit Diagnosis: Unsteadiness on feet (R26.81);Muscle weakness (generalized) (M62.81);Difficulty in walking, not elsewhere classified (R26.2)    Time: 2641-5830 PT Time Calculation (min) (ACUTE ONLY): 34 min   Charges:   PT Evaluation $PT Eval Low Complexity: 1 Low PT Treatments $Therapeutic Exercise: 8-22 mins  Vira Blanco, PT, DPT 1:15 PM,11/06/20

## 2020-11-06 NOTE — Consult Note (Signed)
PHARMACY CONSULT NOTE - FOLLOW UP  Pharmacy Consult for Electrolyte Monitoring and Replacement   Recent Labs: Potassium (mmol/L)  Date Value  11/06/2020 4.3   Magnesium (mg/dL)  Date Value  91/91/6606 1.6 (L)   Calcium (mg/dL)  Date Value  00/45/9977 9.2   Albumin (g/dL)  Date Value  41/42/3953 3.5   Phosphorus (mg/dL)  Date Value  20/23/3435 2.2 (L)   Sodium (mmol/L)  Date Value  11/06/2020 138     Assessment: Patient admitted with acute respiratory failure likely in the setting of unintentional drug overdose. Pharmacy was consulted for electrolyte replacement.  Goal of Therapy:  Electrolytes WNL  Plan:  --Potassium 4.3 - Lokelma stopped --Phosphorous 2.2 - Physician ordered NaPhos 15 mmol IV --Mg 1.6 - Physician ordered Mg 4g IV bolus x 1 --Continue to follow along  Albina Billet, PharmD, BCPS Clinical Pharmacist 11/06/2020 7:54 AM

## 2020-11-06 NOTE — Progress Notes (Signed)
Pt arrived from ICU a&o x 4 able to make needs known.  Oriented to room, phone and tv.  No belongings present in bag but pt does have bottom set of dentures in his mouth.  Denies pain at this time.

## 2020-11-06 NOTE — Progress Notes (Signed)
NAME:  DAYVEN LINSLEY, MRN:  [id number], DOB:  [birth year], LOS: 2 ADMISSION DATE:  11/04/2020, CONSULTATION DATE:  11/04/2020 REFERRING MD:  Dr. Roxan Hockey, CHIEF COMPLAINT:  Altered Mental Status   Brief Pt Description / Synopsis:  70 year old male admitted with acute metabolic encephalopathy and acute hypoxic respiratory failure in the setting of suspected drug overdose (polysubstance including methadone) requiring intubation in the ED for airway protection.  Patient also with acute kidney injury, hyperkalemia, and concern for aspiration.  History of Present Illness:  Ayub Kirsh is a 70 year old with an extensive past medical history as listed below, who presented to [facility] ED on 11/04/2020 due to unresponsiveness.  Patient is currently critically ill, intubated and sedated, therefore history is obtained from patient's daughter-in-law and ED and nursing notes.  His daughter-in-law found him the morning of 11/04/20 unresponsive, with multiple pill bottles and pills scattered on his nightstand (bottles included methadone, clonazepam, Lyrica, Tramadol).  She reports he has been confused for approximately 2 months which she suspects is due to his Lyrica and clonazepam.  He was started on methadone last Saturday 10/30/20 (supposed to discontinue Lyrica and clonazepam), and reports he has been more confused than usual since starting the methadone.  She checked on him at approximately 1:00 am this morning and he was sleeping soundly, and no pill bottles were noted on the nightstand.  He received 8 mg of Narcan via EMS without improvement.  ED Course: Upon arrival to the ED he had spontaneous respirations, but was very somnolent, and was also hypotensive.  He was given additional dose of Narcan without response.  He remained altered with GCS of 6 and absent gag reflexes, thus he was subsequently intubated by the ED provider for airway protection.  There was high suspicion for probable aspiration,  therefore sepsis work-up was initiated.  He was given 2 L of IV fluids along with IV azithromycin and ceftriaxone. EKG did not exhibit peaked T waves or prolonged QT, however given his hyperkalemia he was given albuterol, calcium gluconate, insulin and D50 as temporizing measures.  PCCM was asked to admit the patient to ICU for further work-up and treatment of acute metabolic encephalopathy and acute hypoxic respiratory failure in the setting of suspected drug overdose requiring intubation in the ED for airway protection.  Patient also with acute kidney injury and hyperkalemia, and concern for aspiration.  Pertinent  Medical History  Obstructive sleep apnea not on CPAP due to intolerance Pulmonary sarcoidosis Stenosis of left bronchus Asthma Rheumatoid arthritis Coronary artery disease NSTEMI Hypertension Fibromyalgia BPH GERD Obesity Diabetes mellitus  Micro Data:  11/04/2020: SARS-CoV-2 and influenza PCR>> negative 11/04/2020: Blood culture>> NGTD 11/04/2020: Tracheal aspirate>> moderate GPC on prelim  Antimicrobials:  Azithromycin x1 dose 9/22 Ceftriaxone 9/22>> Flagyl 9/22>>  Significant Hospital Events: Including procedures, antibiotic start and stop dates in addition to other pertinent events   11/04/2020: Presented to ED required in intubation for airway protection.  Concern for aspiration.  PCCM asked to admit 11/05/20: Remains intubated, on propofol and off levophed. Following some commands 11/06/2020: Extubated yesterday without sequela, no respiratory issues today, breathing at baseline  Interim History / Subjective:  Sitting up, eating breakfast without issue.  Does relay that he has poor air entry on his left lung, review of records shows that he has chronic left mainstem bronchus stenosis.  Prior bronchoscopy at [facility] ([place] 04/2020.  Objective   Blood pressure 135/83, pulse 77, temperature 99.7 F (37.6 C), resp. rate (!) 24, height 5\' 11"  (1.803 m),  weight 100.3 kg, SpO2 97  %.    Vent Mode: PSV;CPAP FiO2 (%):  [30 %-40 %] 30 % Set Rate:  [16 bmp] 16 bmp Vt Set:  [500 mL] 500 mL PEEP:  [5 cmH20] 5 cmH20 Pressure Support:  [5 cmH20] 5 cmH20   Intake/Output Summary (Last 24 hours) at 11/06/2020 0803 Last data filed at 11/06/2020 0600 Gross per 24 hour  Intake 453.84 ml  Output 3280 ml  Net -2826.16 ml    Filed Weights   11/04/20 1715 11/05/20 0447 11/06/20 0451  Weight: 97.5 kg 101.8 kg 100.3 kg    Examination: General: Acute on chronically ill-appearing male, sitting up in bed, no respiratory distress. HEENT: Atraumatic, normocephalic, neck supple, no JVD Lungs: Coarse breath sounds with expiratory wheezing throughout left greater than right Cardiovascular: NSR, regular rhythm, S1-S2, no murmurs rubs or gallops Abdomen: Obese, soft, nontender, nondistended, no guarding rebound tenderness, bowel sounds positive x4 Extremities: No deformities, no edema Neuro: No overt focal deficit GU: Foley catheter in place   Resolved Hospital Problem list   Acute chronic respiratory failure  Assessment & Plan:   Acute on ChronicHypoxic Respiratory Failure in setting of Drug overdose and suspected Aspiration PMHx of Pulmonary Sarcoidosis, OSA not on BiPAP for last 2 years due to intolerance, on home O2 at 2 L/min.  Asthma versus sarcoid bronchitis, recent Dx left mainstem bronchus stenosis -Extubated yesterday without sequela -Wean supplemental oxygen to keep sats at 92% or better -Follow intermittent Chest X-ray & ABG as needed -Brovana & Budesonide nebs with as needed albuterol -Solu-Medrol DC'd due to agitation patient confirms he does not do well with steroids -ABX: Reduced to Rocephin total 5 days  Hypotension, ? Septic shock due to Aspiration vs. Sedation related PMHx of CAD, NSTEMI, Hypertension -Issue has resolved -Remains normotensive  Concern for Aspiration -Monitor fever curve -Trend WBC's & Procalcitonin -Follow cultures as above -Continue  empiric Ceftriaxone for 5 days  Acute Metabolic Encephalopathy in setting of suspected Drug Overdose (Polysubstance) & multiple metabolic derangements -appears resolved PMHx of Fibromyalgia, chronic pain -Add Thiamin daily -TSH, free T4, normal -CT Head 11/04/20 negative -Urine drug screen positive for Benzodiazepines, Methadone, Opiates, and Tricyclics -Resume low-dose pain medications, Cymbalta  Drug Overdose (Polysubstance: Methadone, benzodiazepines, opiates, tricyclics) Elevated Serum Tylenol (serum level 73 at admission) Chronic pain with polysubstance dependence Fibromyalgia -Acetaminophen levels decreased accordingly -No intent noted on overdose -Medication misadventure -Limit sedating/pain meds  Acute Kidney Injury Rhabdomyolysis PMHx of BPH -Monitor I&O's / urinary output -Follow BMP -Ensure adequate renal perfusion -Avoid nephrotoxic agents as able -Replace electrolytes as indicated -IV Fluids -Follow CK, decreasing -Electrolyte abnormalities corrected -AVOID statins with increased CK and complaints of generalized pain -Renal US 11/04/20: normal kidneys, no hydronephrosis or renal cortical thinning; bilateral benign-appearing renal cysts, bladder collapse around foley catheter   Anemia without s/sx of overt bleeding -Monitor for S/Sx of bleeding -Trend CBC -Heparin SQ for VTE Prophylaxis  -Transfuse for Hgb <7  Diabetes Mellitus -CBG's q4h; Target range of 140 to 180 -SSI -Follow ICU Hypo/Hyperglycemia protocol -Check Hgb A1c - pending   Best Practice (right click and "Reselect all SmartList Selections" daily)   Diet/type: Regular as tolerated DVT prophylaxis: prophylactic heparin  GI prophylaxis: H2B Lines: N/A Foley: DC Foley Code Status:  full code Last date of multidisciplinary goals of care discussion [11/05/20] Disposition: transfer to general MedSurg ward, transfer to [facility] service.  Labs   CBC: Recent Labs  Lab 11/04/20 0920 11/05/20 0409  11/06/20 4403  WBC 11.8* 15.8* 13.4*  NEUTROABS 8.4*  --   --   HGB 12.5* 11.7* 12.2*  HCT 37.0* 35.4* 36.8*  MCV 93.7 91.7 91.3  PLT 223 207 239     Basic Metabolic Panel: Recent Labs  Lab 11/04/20 0920 11/04/20 1759 11/05/20 0409 11/05/20 2143 11/06/20 0349  NA 128* 133* 133*  --  138  K 6.7* 5.0 5.5* 4.5 4.3  CL 95* 106 104  --  105  CO2 19* 19* 22  --  24  GLUCOSE 221* 159* 93  --  91  BUN 39* 38* 33*  --  21  CREATININE 2.95* 2.37* 1.83*  --  1.18  CALCIUM 10.2 9.3 9.0  --  9.2  MG  --   --   --  1.9 1.6*  PHOS  --   --   --  2.7 2.2*    GFR: Estimated Creatinine Clearance: 71.3 mL/min (by C-G formula based on SCr of 1.18 mg/dL). Recent Labs  Lab 11/04/20 0920 11/04/20 1038 11/04/20 1254 11/04/20 1524 11/04/20 1759 11/05/20 0409 11/06/20 0349  PROCALCITON  --   --  <0.10  --   --  <0.10 <0.10  WBC 11.8*  --   --   --   --  15.8* 13.4*  LATICACIDVEN  --  2.6* 3.0* 2.0* 2.2*  --   --      Liver Function Tests: Recent Labs  Lab 11/04/20 0920 11/05/20 0409 11/06/20 0349  AST 31 117* 133*  ALT 19 42 43  ALKPHOS 52 45 45  BILITOT 0.9 0.9 0.9  PROT 7.0 6.2* 6.5  ALBUMIN 4.0 3.2* 3.5    No results for input(s): LIPASE, AMYLASE in the last 168 hours. No results for input(s): AMMONIA in the last 168 hours.  ABG    Component Value Date/Time   PHART 7.34 (L) 11/05/2020 0500   PCO2ART 37 11/05/2020 0500   PO2ART 148 (H) 11/05/2020 0500   HCO3 20.0 11/05/2020 0500   ACIDBASEDEF 5.2 (H) 11/05/2020 0500   O2SAT 99.2 11/05/2020 0500      Coagulation Profile: Recent Labs  Lab 11/04/20 1039  INR 1.1     Cardiac Enzymes: Recent Labs  Lab 11/04/20 1254 11/05/20 0409 11/06/20 0349  CKTOTAL 1,950* 4,065* 3,963*     HbA1C: Hgb A1c MFr Bld  Date/Time Value Ref Range Status  11/05/2020 04:09 AM 6.2 (H) 4.8 - 5.6 % Final    Comment:    (NOTE)         Prediabetes: 5.7 - 6.4         Diabetes: >6.4         Glycemic control for adults with  diabetes: <7.0     CBG: Recent Labs  Lab 11/05/20 0919 11/05/20 1126 11/05/20 1647 11/05/20 1923 11/06/20 0750  GLUCAP 73 72 129* 119* 99     Review of Systems:   Unable to assess due to AMS, intubation, sedation  Allergies Allergies  Allergen Reactions   Penicillins Rash   Ciprofloxacin     Other reaction(s): Dizziness Pt reports he took one dose and fainted, fell and hurt his leg   Azithromycin     Scheduled Meds:  budesonide (PULMICORT) nebulizer solution  0.5 mg Nebulization BID   Chlorhexidine Gluconate Cloth  6 each Topical Q0600   heparin  5,000 Units Subcutaneous Q8H   insulin aspart  0-15 Units Subcutaneous TID AC & HS   ipratropium-albuterol  3 mL Nebulization Q6H   thiamine injection  100 mg Intravenous Daily   Continuous Infusions:  cefTRIAXone (ROCEPHIN)  IV 2 g (11/06/20 0556)   magnesium sulfate bolus IVPB 4 g (11/06/20 0800)   metronidazole 500 mg (11/06/20 0450)   sodium phosphate  Dextrose 5% IVPB     PRN Meds:.docusate, polyethylene glycol   Level 3 follow-up     C. Danice Goltz, MD Advanced Bronchoscopy PCCM [facility]    *This note was dictated using voice recognition software/Dragon.  Despite best efforts to proofread, errors can occur which can change the meaning.  Any change was purely unintentional.

## 2020-11-07 DIAGNOSIS — T50901A Poisoning by unspecified drugs, medicaments and biological substances, accidental (unintentional), initial encounter: Secondary | ICD-10-CM | POA: Diagnosis not present

## 2020-11-07 LAB — COMPREHENSIVE METABOLIC PANEL
ALT: 46 U/L — ABNORMAL HIGH (ref 0–44)
AST: 113 U/L — ABNORMAL HIGH (ref 15–41)
Albumin: 3.7 g/dL (ref 3.5–5.0)
Alkaline Phosphatase: 48 U/L (ref 38–126)
Anion gap: 9 (ref 5–15)
BUN: 14 mg/dL (ref 8–23)
CO2: 25 mmol/L (ref 22–32)
Calcium: 9.4 mg/dL (ref 8.9–10.3)
Chloride: 102 mmol/L (ref 98–111)
Creatinine, Ser: 0.87 mg/dL (ref 0.61–1.24)
GFR, Estimated: >60 mL/min (ref >60)
Glucose, Bld: 124 mg/dL — ABNORMAL HIGH (ref 70–99)
Potassium: 3.6 mmol/L (ref 3.5–5.1)
Sodium: 136 mmol/L (ref 135–145)
Total Bilirubin: 0.7 mg/dL (ref 0.3–1.2)
Total Protein: 6.7 g/dL (ref 6.5–8.1)

## 2020-11-07 LAB — MAGNESIUM: Magnesium: 1.9 mg/dL (ref 1.7–2.4)

## 2020-11-07 LAB — CBC
HCT: 37.6 % — ABNORMAL LOW (ref 39.0–52.0)
Hemoglobin: 12.6 g/dL — ABNORMAL LOW (ref 13.0–17.0)
MCH: 30.3 pg (ref 26.0–34.0)
MCHC: 33.5 g/dL (ref 30.0–36.0)
MCV: 90.4 fL (ref 80.0–100.0)
Platelets: 279 10*3/uL (ref 150–400)
RBC: 4.16 MIL/uL — ABNORMAL LOW (ref 4.22–5.81)
RDW: 13.2 % (ref 11.5–15.5)
WBC: 11.2 10*3/uL — ABNORMAL HIGH (ref 4.0–10.5)
nRBC: 0 % (ref 0.0–0.2)

## 2020-11-07 LAB — CULTURE, RESPIRATORY W GRAM STAIN

## 2020-11-07 LAB — PHOSPHORUS: Phosphorus: 2.6 mg/dL (ref 2.5–4.6)

## 2020-11-07 LAB — GLUCOSE, CAPILLARY
Glucose-Capillary: 136 mg/dL — ABNORMAL HIGH (ref 70–99)
Glucose-Capillary: 214 mg/dL — ABNORMAL HIGH (ref 70–99)
Glucose-Capillary: 176 mg/dL — ABNORMAL HIGH (ref 70–99)
Glucose-Capillary: 129 mg/dL — ABNORMAL HIGH (ref 70–99)

## 2020-11-07 LAB — CK: Total CK: 2284 U/L — ABNORMAL HIGH (ref 49–397)

## 2020-11-07 MED ORDER — ONDANSETRON HCL 4 MG/2ML IJ SOLN
4.0000 mg | Freq: Four times a day (QID) | INTRAMUSCULAR | Status: DC | PRN
  Administered 2020-11-07: 4 mg via INTRAVENOUS
  Filled 2020-11-07: qty 2

## 2020-11-07 MED ORDER — PANTOPRAZOLE SODIUM 40 MG PO TBEC
40.0000 mg | DELAYED_RELEASE_TABLET | Freq: Every day | ORAL | Status: DC
  Administered 2020-11-08 – 2020-11-09 (×3): 40 mg via ORAL
  Filled 2020-11-07 (×3): qty 1

## 2020-11-07 MED ORDER — SODIUM CHLORIDE 0.9 % IV SOLN
12.5000 mg | Freq: Once | INTRAVENOUS | Status: AC
  Administered 2020-11-07: 12.5 mg via INTRAVENOUS
  Filled 2020-11-07: qty 12.5

## 2020-11-07 MED ORDER — ONDANSETRON 4 MG PO TBDP
4.0000 mg | ORAL_TABLET | Freq: Three times a day (TID) | ORAL | Status: DC | PRN
  Filled 2020-11-07: qty 1

## 2020-11-07 MED ORDER — SODIUM CHLORIDE 0.9 % IV SOLN: INTRAVENOUS | Status: AC

## 2020-11-07 MED ORDER — ASPIRIN EC 81 MG PO TBEC
81.0000 mg | DELAYED_RELEASE_TABLET | Freq: Every day | ORAL | Status: DC
  Administered 2020-11-08 – 2020-11-09 (×2): 81 mg via ORAL
  Filled 2020-11-07 (×2): qty 1

## 2020-11-07 MED ORDER — PROMETHAZINE HCL 25 MG/ML IJ SOLN: 25.0000 mg | Freq: Once | INTRAMUSCULAR | Status: DC

## 2020-11-07 MED ORDER — THIAMINE HCL 100 MG/ML IJ SOLN
100.0000 mg | INTRAMUSCULAR | Status: DC
  Filled 2020-11-07: qty 2

## 2020-11-07 MED ORDER — THIAMINE HCL 100 MG PO TABS
100.0000 mg | ORAL_TABLET | Freq: Every day | ORAL | Status: DC
  Administered 2020-11-07: 100 mg via ORAL

## 2020-11-07 MED ORDER — ENOXAPARIN SODIUM 40 MG/0.4ML IJ SOSY
40.0000 mg | PREFILLED_SYRINGE | INTRAMUSCULAR | Status: DC
  Administered 2020-11-07 – 2020-11-08 (×2): 40 mg via SUBCUTANEOUS
  Filled 2020-11-07 (×2): qty 0.4

## 2020-11-07 MED ORDER — ONDANSETRON HCL 4 MG/5ML PO SOLN
4.0000 mg | Freq: Once | ORAL | Status: AC
  Administered 2020-11-07: 4 mg via ORAL
  Filled 2020-11-07: qty 5

## 2020-11-07 MED ORDER — THIAMINE HCL 100 MG PO TABS
100.0000 mg | ORAL_TABLET | ORAL | Status: DC
  Administered 2020-11-08 – 2020-11-09 (×2): 100 mg via ORAL
  Filled 2020-11-07 (×3): qty 1

## 2020-11-07 NOTE — Progress Notes (Signed)
PHARMACIST - PHYSICIAN COMMUNICATION  DR:   Fran Lowes  CONCERNING: IV to Oral Route Change Policy  RECOMMENDATION: This patient is receiving thiamine by the intravenous route.  Based on criteria approved by the Pharmacy and Therapeutics Committee, the intravenous medication(s) is/are being converted to the equivalent oral dose form(s).   DESCRIPTION: These criteria include: The patient is eating (either orally or via tube) and/or has been taking other orally administered medications for a least 24 hours The patient has no evidence of active gastrointestinal bleeding or impaired GI absorption (gastrectomy, short bowel, patient on TNA or NPO).  If you have questions about this conversion, please contact the Pharmacy Department  []   [phone] )  ( [phone] [x]   [phone] )  [facility] []   [phone] )  [facility] []   [phone] )  [facility] []   [phone] )  [facility]   ( [phone], PharmD, BCPS Clinical Pharmacist 11/07/2020 9:02 AM

## 2020-11-07 NOTE — Progress Notes (Addendum)
PROGRESS NOTE    Ethan Frye  RUE:[id number] DOB: Oct 23, 1950 DOA: 11/04/2020 PCP: Pcp, No  136A/136A-AA   Assessment & Plan:   Active Problems:   Drug overdose   Ethan Frye is a 70 year old with an extensive past medical history as listed below, who presented to [facility] ED on 11/04/2020 due to unresponsiveness.     His daughter-in-law found him the morning of 11/04/20 unresponsive, with multiple pill bottles and pills scattered on his nightstand (bottles included methadone, clonazepam, Lyrica, Tramadol).  She reports he has been confused for approximately 2 months which she suspects is due to his Lyrica and clonazepam.  He was started on methadone last Saturday 10/30/20 (supposed to discontinue Lyrica and clonazepam), and reports he has been more confused than usual since starting the methadone.     He received 8 mg of Narcan via EMS without improvement.  Upon arrival to the ED he had spontaneous respirations, but was very somnolent, and was also hypotensive.  He was given additional dose of Narcan without response.  He remained altered with GCS of 6 and absent gag reflexes, thus he was subsequently intubated by the ED provider for airway protection.   PCCM admitted the patient to ICU for further work-up and treatment of acute metabolic encephalopathy, acute hypoxic respiratory failure in the setting of suspected drug overdose requiring intubation, acute kidney injury and hyperkalemia, and concern for aspiration.  Pt was extubated and transferred to hospitalist service on 11/07/20.  # Acute on Chronic Hypoxic Respiratory Failure in setting of Drug overdose and suspected Aspiration # Hx of Pulmonary Sarcoidosis # OSA not on BiPAP for last 2 years due to intolerance # Chronic hypoxic respiratory failure on home 2L O2  # Left mainstem bronchus stenosis -Extubated on 9/23 and now back to baseline 2L [place]. --trach aspirate cx grew rare pseudomonas, likely chronic colonization and no need  to treat if pt is improving, per Dr. Jayme Cloud Plan: --cont empiric ceftriaxone for 5 day course -Brovana & Budesonide nebs with as needed albuterol   Hypotension likely due to sedation, resolved  Rhabo, traumatic --CK peaked at ~4000, trending down to ~2000 today. --NS@100  for 10 hours   Acute Metabolic Encephalopathy in setting of suspected unintentional Drug Overdose (Polysubstance) & multiple metabolic derangements, resolved --mental status back to baseline now. -TSH, free T4, normal -CT Head 11/04/20 negative -Urine drug screen positive for Benzodiazepines, Methadone, Opiates, and Tricyclics.  Elevated Serum Tylenol (serum level 73 at admission) --wife advised to monitor pt's medication use   Chronic pain with polysubstance dependence Fibromyalgia --reported home meds include Klonopin, Norco, Methadone, Lyrica, tramadol, however, can not find any controlled substance Rx in PDMP for this pt --check with pharm tomorrow    Acute Kidney Injury, resolved --Cr 2.95 on presentation.  Could be due to hypotension, rhabo.  Cr back to baseline after IVF.  Mild Anemia without s/sx of overt bleeding  Diabetes Mellitus, well controlled --A1c 6.2 --hold home metformin --SSI TID  Isolated fever --had a brief episode of fever to 100.9 (oral) this afternoon, which resolved without tylenol.  No tachycardia.  WBC trending down, procal neg.   --Monitor for now   DVT prophylaxis: Lovenox SQ Code Status: Full code  Family Communication: wife updated at bedside today Level of care: Med-Surg Dispo:   The patient is from: home Anticipated d/c is to: home Anticipated d/c date is: 1-2 days Patient currently is not medically ready to d/c due to: IV abx course for 5 days  Subjective and Interval History:  Pt was transferred to hospitalist service from [facility] today.    Pt had N/V this morning.  Reported breathing at baseline.  Had BM.   Objective: Vitals:   11/07/20 0536 11/07/20 0751  11/07/20 1155 11/07/20 1517  BP: (!) 168/87 (!) 173/84 (!) 159/74 (!) 141/79  Pulse: (!) 59 60 (!) 55 66  Resp: 20 16 20 18   Temp: 98.8 F (37.1 C) 98.7 F (37.1 C) (!) 97.4 F (36.3 C) (!) 100.9 F (38.3 C)  TempSrc:  Oral Oral Oral  SpO2: 99% 99% 98% 100%  Weight:      Height:        Intake/Output Summary (Last 24 hours) at 11/07/2020 1807 Last data filed at 11/07/2020 1500 Gross per 24 hour  Intake 789.6 ml  Output --  Net 789.6 ml   Filed Weights   11/04/20 1715 11/05/20 0447 11/06/20 0451  Weight: 97.5 kg 101.8 kg 100.3 kg    Examination:   Constitutional: NAD, AAOx3 HEENT: conjunctivae and lids normal, EOMI, hard of hearing CV: No cyanosis.   RESP: normal respiratory effort, on 2L Extremities: No effusions, edema in BLE SKIN: warm, dry Neuro: II - XII grossly intact.   Psych: Normal mood and affect.     Data Reviewed: I have personally reviewed following labs and imaging studies  CBC: Recent Labs  Lab 11/04/20 0920 11/05/20 0409 11/06/20 0349 11/07/20 0518  WBC 11.8* 15.8* 13.4* 11.2*  NEUTROABS 8.4*  --   --   --   HGB 12.5* 11.7* 12.2* 12.6*  HCT 37.0* 35.4* 36.8* 37.6*  MCV 93.7 91.7 91.3 90.4  PLT 223 207 239 279   Basic Metabolic Panel: Recent Labs  Lab 11/04/20 0920 11/04/20 1759 11/05/20 0409 11/05/20 2143 11/06/20 0349 11/07/20 0518  NA 128* 133* 133*  --  138 136  K 6.7* 5.0 5.5* 4.5 4.3 3.6  CL 95* 106 104  --  105 102  CO2 19* 19* 22  --  24 25  GLUCOSE 221* 159* 93  --  91 124*  BUN 39* 38* 33*  --  21 14  CREATININE 2.95* 2.37* 1.83*  --  1.18 0.87  CALCIUM 10.2 9.3 9.0  --  9.2 9.4  MG  --   --   --  1.9 1.6* 1.9  PHOS  --   --   --  2.7 2.2* 2.6   GFR: Estimated Creatinine Clearance: 96.7 mL/min (by C-G formula based on SCr of 0.87 mg/dL). Liver Function Tests: Recent Labs  Lab 11/04/20 0920 11/05/20 0409 11/06/20 0349 11/07/20 0518  AST 31 117* 133* 113*  ALT 19 42 43 46*  ALKPHOS 52 45 45 48  BILITOT 0.9 0.9  0.9 0.7  PROT 7.0 6.2* 6.5 6.7  ALBUMIN 4.0 3.2* 3.5 3.7   No results for input(s): LIPASE, AMYLASE in the last 168 hours. No results for input(s): AMMONIA in the last 168 hours. Coagulation Profile: Recent Labs  Lab 11/04/20 1039  INR 1.1   Cardiac Enzymes: Recent Labs  Lab 11/04/20 1254 11/05/20 0409 11/06/20 0349 11/07/20 0518  CKTOTAL 1,950* 4,065* 3,963* 2,284*   BNP (last 3 results) No results for input(s): PROBNP in the last 8760 hours. HbA1C: Recent Labs    11/05/20 0409  HGBA1C 6.2*   CBG: Recent Labs  Lab 11/06/20 1612 11/06/20 2045 11/07/20 0751 11/07/20 1208 11/07/20 1659  GLUCAP 123* 144* 136* 214* 176*   Lipid Profile: No results for input(s): CHOL, HDL,  LDLCALC, TRIG, CHOLHDL, LDLDIRECT in the last 72 hours. Thyroid Function Tests: Recent Labs    11/05/20 1058  TSH 0.475  FREET4 0.84   Anemia Panel: No results for input(s): VITAMINB12, FOLATE, FERRITIN, TIBC, IRON, RETICCTPCT in the last 72 hours. Sepsis Labs: Recent Labs  Lab 11/04/20 1038 11/04/20 1254 11/04/20 1524 11/04/20 1759 11/05/20 0409 11/06/20 0349  PROCALCITON  --  <0.10  --   --  <0.10 <0.10  LATICACIDVEN 2.6* 3.0* 2.0* 2.2*  --   --     Recent Results (from the past 240 hour(s))  Resp Panel by RT-PCR (Flu A&B, Covid) Urine, Clean Catch     Status: None   Collection Time: 11/04/20 10:38 AM   Specimen: Urine, Clean Catch; Nasopharyngeal(NP) swabs in vial transport medium  Result Value Ref Range Status   SARS Coronavirus 2 by RT PCR NEGATIVE NEGATIVE Final    Comment: (NOTE) SARS-CoV-2 target nucleic acids are NOT DETECTED.  The SARS-CoV-2 RNA is generally detectable in upper respiratory specimens during the acute phase of infection. The lowest concentration of SARS-CoV-2 viral copies this assay can detect is 138 copies/mL. A negative result does not preclude SARS-Cov-2 infection and should not be used as the sole basis for treatment or other patient management  decisions. A negative result may occur with  improper specimen collection/handling, submission of specimen other than nasopharyngeal swab, presence of viral mutation(s) within the areas targeted by this assay, and inadequate number of viral copies(<138 copies/mL). A negative result must be combined with clinical observations, patient history, and epidemiological information. The expected result is Negative.  Fact Sheet for Patients:  BloggerCourse.com  Fact Sheet for Healthcare Providers:  SeriousBroker.it  This test is no t yet approved or cleared by the [place] FDA and  has been authorized for detection and/or diagnosis of SARS-CoV-2 by FDA under an Emergency Use Authorization (EUA). This EUA will remain  in effect (meaning this test can be used) for the duration of the COVID-19 declaration under Section 564(b)(1) of the Act, 21 U.S.C.section 360bbb-3(b)(1), unless the authorization is terminated  or revoked sooner.       Influenza A by PCR NEGATIVE NEGATIVE Final   Influenza B by PCR NEGATIVE NEGATIVE Final    Comment: (NOTE) The Xpert Xpress SARS-CoV-2/FLU/RSV plus assay is intended as an aid in the diagnosis of influenza from Nasopharyngeal swab specimens and should not be used as a sole basis for treatment. Nasal washings and aspirates are unacceptable for Xpert Xpress SARS-CoV-2/FLU/RSV testing.  Fact Sheet for Patients: BloggerCourse.com  Fact Sheet for Healthcare Providers: SeriousBroker.it  This test is not yet approved or cleared by the [place] FDA and has been authorized for detection and/or diagnosis of SARS-CoV-2 by FDA under an Emergency Use Authorization (EUA). This EUA will remain in effect (meaning this test can be used) for the duration of the COVID-19 declaration under Section 564(b)(1) of the Act, 21 U.S.C. section 360bbb-3(b)(1), unless the  authorization is terminated or revoked.  Performed at [facility], [address] [place], [place] [address]   Blood Culture (routine x 2)     Status: None (Preliminary result)   Collection Time: 11/04/20 10:40 AM   Specimen: BLOOD  Result Value Ref Range Status   Specimen Description BLOOD RIGHT ARM  Final   Special Requests   Final    BOTTLES DRAWN AEROBIC AND ANAEROBIC Blood Culture adequate volume   Culture   Final    NO GROWTH 3 DAYS Performed at [facility], [address] [place], [place] [address]    Report Status PENDING  Incomplete  Blood Culture (routine x 2)     Status: None (Preliminary result)   Collection Time: 11/04/20 12:54 PM   Specimen: BLOOD  Result Value Ref Range Status   Specimen Description BLOOD FOREARM  Final   Special Requests   Final    BOTTLES DRAWN AEROBIC AND ANAEROBIC Blood Culture adequate volume   Culture   Final    NO GROWTH 3 DAYS Performed at [facility], [address] [place], [place] [address]    Report Status PENDING  Incomplete  MRSA Next Gen by PCR, Nasal     Status: None   Collection Time: 11/04/20  4:30 PM   Specimen: Nasal Mucosa; Nasal Swab  Result Value Ref Range Status   MRSA by PCR Next Gen NOT DETECTED NOT DETECTED Final    Comment: (NOTE) The GeneXpert MRSA Assay (FDA approved for NASAL specimens only), is one component of a comprehensive MRSA colonization surveillance program. It is not intended to diagnose MRSA infection nor to guide or monitor treatment for MRSA infections. Test performance is not FDA approved in patients less than 31 years old. Performed at [facility], [address] [place], [place] [address]   Culture, Respiratory w Gram Stain     Status: None   Collection Time: 11/04/20  9:43 PM   Specimen: Tracheal Aspirate; Respiratory  Result Value Ref Range Status   Specimen Description   Final    TRACHEAL ASPIRATE Performed at [facility], [address], [place], [place] [address]    Special Requests   Final    NONE Performed at [facility], [address] [place], [place] [address]    Gram Stain   Final    FEW SQUAMOUS EPITHELIAL CELLS PRESENT MODERATE WBC SEEN MODERATE GRAM POSITIVE COCCI Performed at [facility], [address] [place], [place] [address]    Culture RARE PSEUDOMONAS AERUGINOSA  Final   Report Status 11/07/2020 FINAL  Final   Organism ID, Bacteria PSEUDOMONAS AERUGINOSA  Final      Susceptibility   Pseudomonas aeruginosa - MIC*    CEFTAZIDIME 4 SENSITIVE Sensitive     CIPROFLOXACIN <=0.25 SENSITIVE Sensitive     GENTAMICIN 4 SENSITIVE Sensitive     IMIPENEM 2 SENSITIVE Sensitive     PIP/TAZO 8 SENSITIVE Sensitive     CEFEPIME 2 SENSITIVE Sensitive     * RARE PSEUDOMONAS AERUGINOSA      Radiology Studies: No results found.   Scheduled Meds:  arformoterol  15 mcg Nebulization BID   budesonide (PULMICORT) nebulizer solution  0.5 mg Nebulization BID   Chlorhexidine Gluconate Cloth  6 each Topical Q0600   cholecalciferol  1,000 Units Oral Daily   DULoxetine  30 mg Oral Daily   heparin  5,000 Units Subcutaneous Q8H   insulin aspart  0-15 Units Subcutaneous TID AC & HS   [START ON 11/08/2020] thiamine  100 mg Oral Q24H   Or   [START ON 11/08/2020] thiamine injection  100 mg Intravenous Q24H   tiZANidine  4 mg Oral TID   vitamin B-12  1,000 mcg Oral Daily   Continuous Infusions:  sodium chloride 100 mL/hr at 11/07/20 1056   cefTRIAXone (ROCEPHIN)  IV 2 g (11/07/20 0731)     LOS: 3 days     Darlin Priestly, MD [facility] If 7PM-7AM, please contact night-coverage 11/07/2020, 6:07 PM

## 2020-11-07 NOTE — Progress Notes (Addendum)
Met with the patient and family in the room He has oxygen at home and a C pap that he will be using, He has had [place] previously and is agreeable to [facility] PT again, the patient is not in network with [facility], I reached out to [facility] and requested them to accept the patient,  Awaiting a call back, The patient will need a RW and I notified [employer], they will deliver to the room prior to DC, He lives at home with his spouse and has other family support, He has transportation with his wife and family, he can afford his medications  [facility] has accepted the patient for [facility] services

## 2020-11-07 NOTE — Progress Notes (Signed)
Patient ID: Ethan Frye, male   DOB: 1950-03-08, 70 y.o.   MRN: [id number] Patient seen at the bedside due to nurse request.  Attending could not be reached for antinausea medication.  The patient had episode of vomiting after breakfast.  On examination the patient is alert and oriented and is with the wife. He seems to be uncomfortable and vomiting into a bag.  He has soft abdomen with positive bowel sounds. I gave one time order of Zofran 4mg  IV x 1 and will notify Dr. .

## 2020-11-08 DIAGNOSIS — T50901A Poisoning by unspecified drugs, medicaments and biological substances, accidental (unintentional), initial encounter: Secondary | ICD-10-CM | POA: Diagnosis not present

## 2020-11-08 DIAGNOSIS — R112 Nausea with vomiting, unspecified: Secondary | ICD-10-CM

## 2020-11-08 DIAGNOSIS — G8929 Other chronic pain: Secondary | ICD-10-CM

## 2020-11-08 LAB — GLUCOSE, CAPILLARY
Glucose-Capillary: 124 mg/dL — ABNORMAL HIGH (ref 70–99)
Glucose-Capillary: 125 mg/dL — ABNORMAL HIGH (ref 70–99)
Glucose-Capillary: 119 mg/dL — ABNORMAL HIGH (ref 70–99)
Glucose-Capillary: 122 mg/dL — ABNORMAL HIGH (ref 70–99)

## 2020-11-08 LAB — CBC
HCT: 36 % — ABNORMAL LOW (ref 39.0–52.0)
Hemoglobin: 12.2 g/dL — ABNORMAL LOW (ref 13.0–17.0)
MCH: 30.6 pg (ref 26.0–34.0)
MCHC: 33.9 g/dL (ref 30.0–36.0)
MCV: 90.2 fL (ref 80.0–100.0)
Platelets: 303 10*3/uL (ref 150–400)
RBC: 3.99 MIL/uL — ABNORMAL LOW (ref 4.22–5.81)
RDW: 13.1 % (ref 11.5–15.5)
WBC: 14.3 10*3/uL — ABNORMAL HIGH (ref 4.0–10.5)
nRBC: 0 % (ref 0.0–0.2)

## 2020-11-08 LAB — BASIC METABOLIC PANEL
Anion gap: 6 (ref 5–15)
BUN: 12 mg/dL (ref 8–23)
CO2: 27 mmol/L (ref 22–32)
Calcium: 9.4 mg/dL (ref 8.9–10.3)
Chloride: 102 mmol/L (ref 98–111)
Creatinine, Ser: 0.83 mg/dL (ref 0.61–1.24)
GFR, Estimated: >60 mL/min (ref >60)
Glucose, Bld: 171 mg/dL — ABNORMAL HIGH (ref 70–99)
Potassium: 3.4 mmol/L — ABNORMAL LOW (ref 3.5–5.1)
Sodium: 135 mmol/L (ref 135–145)

## 2020-11-08 LAB — CK: Total CK: 1072 U/L — ABNORMAL HIGH (ref 49–397)

## 2020-11-08 LAB — MAGNESIUM: Magnesium: 1.5 mg/dL — ABNORMAL LOW (ref 1.7–2.4)

## 2020-11-08 MED ORDER — SODIUM CHLORIDE 0.9 % IV SOLN
INTRAVENOUS | Status: DC | PRN
  Administered 2020-11-08: 500 mL via INTRAVENOUS

## 2020-11-08 MED ORDER — POTASSIUM CHLORIDE CRYS ER 20 MEQ PO TBCR
40.0000 meq | EXTENDED_RELEASE_TABLET | Freq: Once | ORAL | Status: AC
  Administered 2020-11-08: 40 meq via ORAL
  Filled 2020-11-08: qty 2

## 2020-11-08 MED ORDER — GUAIFENESIN ER 600 MG PO TB12
600.0000 mg | ORAL_TABLET | Freq: Two times a day (BID) | ORAL | Status: DC
  Administered 2020-11-08 – 2020-11-09 (×4): 600 mg via ORAL
  Filled 2020-11-08 (×4): qty 1

## 2020-11-08 MED ORDER — SODIUM CHLORIDE 0.9 % IV SOLN
12.5000 mg | Freq: Four times a day (QID) | INTRAVENOUS | Status: DC | PRN
  Administered 2020-11-08: 12.5 mg via INTRAVENOUS
  Filled 2020-11-08: qty 0.5

## 2020-11-08 MED ORDER — HYDROCOD POLST-CPM POLST ER 10-8 MG/5ML PO SUER
5.0000 mL | Freq: Two times a day (BID) | ORAL | Status: DC | PRN
Start: 2020-11-08 — End: 2020-11-09
  Administered 2020-11-08: 5 mL via ORAL
  Filled 2020-11-08: qty 5

## 2020-11-08 MED ORDER — SODIUM CHLORIDE 0.9 % IV SOLN: INTRAVENOUS | Status: DC

## 2020-11-08 NOTE — Care Management Important Message (Signed)
Important Message  Patient Details  Name: OBADIAH DENNARD MRN: [id number] Date of Birth: 1950/04/07   Medicare Important Message Given:  Yes     Bernadette Hoit 11/08/2020, 12:51 PM

## 2020-11-08 NOTE — Progress Notes (Signed)
PROGRESS NOTE    Ethan Frye  HFW:[id number] DOB: 08/05/1950 DOA: 11/04/2020 PCP: Darrick Penna, MD  [id number]   Assessment & Plan:   Active Problems:   Drug overdose   Ethan Frye is a 70 year old with an extensive past medical history as listed below, who presented to [facility] ED on 11/04/2020 due to unresponsiveness.     His daughter-in-law found him the morning of 11/04/20 unresponsive, with multiple pill bottles and pills scattered on his nightstand (bottles included methadone, clonazepam, Lyrica, Tramadol).  She reports he has been confused for approximately 2 months which she suspects is due to his Lyrica and clonazepam.  He was started on methadone last Saturday 10/30/20 (supposed to discontinue Lyrica and clonazepam), and reports he has been more confused than usual since starting the methadone.     He received 8 mg of Narcan via EMS without improvement.  Upon arrival to the ED he had spontaneous respirations, but was very somnolent, and was also hypotensive.  He was given additional dose of Narcan without response.  He remained altered with GCS of 6 and absent gag reflexes, thus he was subsequently intubated by the ED provider for airway protection.   PCCM admitted the patient to ICU for further work-up and treatment of acute metabolic encephalopathy, acute hypoxic respiratory failure in the setting of suspected drug overdose requiring intubation, acute kidney injury and hyperkalemia, and concern for aspiration.  Pt was extubated and transferred to hospitalist service on 11/07/20.  # Acute on Chronic Hypoxic Respiratory Failure in setting of Drug overdose and suspected Aspiration # Hx of Pulmonary Sarcoidosis # OSA not on BiPAP for last 2 years due to intolerance # Chronic hypoxic respiratory failure on home 2L O2  # Left mainstem bronchus stenosis -Extubated on 9/23 and now back to baseline 2L [place]. 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 Jayme Cloud Plan: --cont empiric ceftriaxone for 5 day course which he will finish tomorrow -Brovana & Budesonide nebs with as needed albuterol   Hypotension likely due to sedation, resolved  Rhabo, traumatic --CK peaked at ~4000, trending down to ~1000 today. -- Continue normal saline   Acute Metabolic Encephalopathy in setting of suspected unintentional Drug Overdose (Polysubstance) & multiple metabolic derangements, resolved --mental status back to baseline now. -TSH, free T4, normal -CT Head 11/04/20 negative -Urine drug screen positive for Benzodiazepines, Methadone, Opiates, and Tricyclics.  Elevated Serum Tylenol (serum level 73 at admission) --wife advised to monitor pt's medication use   Chronic pain with polysubstance dependence Fibromyalgia --reported home meds include Klonopin, Norco, Methadone, Lyrica, tramadol, however, can not find any controlled substance Rx in PDMP for this pt   Acute Kidney Injury, resolved --Cr 2.95 on presentation.  Could be due to hypotension, rhabo.  Cr back to baseline after IVF.  Mild Anemia without s/sx of overt bleeding  Diabetes Mellitus, well controlled --A1c 6.2 --hold home metformin --SSI TID  Isolated fever -- Transient and resolved now no tachycardia.  WBC trending down, procal neg.   --Monitor for now  Nausea and vomiting Patient had projectile vomiting last evening per wife after lunch and she would like to monitor him to make sure that issue does not recur tonight and he is able to tolerate diet.  I will stop Phenergan.   DVT prophylaxis: Lovenox SQ Code Status: Full code  Family Communication: wife updated at bedside today Level of care: Med-Surg Dispo:   The  patient is from: home Anticipated d/c is to: home Anticipated d/c date is: 1-2 days Patient currently is not medically ready to d/c due to: IV abx course for 5 days   Subjective and Interval History:   Keep reiterating about his hearing aid issue and having difficulty hearing.  Wife at bedside.  She reported having vomiting which was projectile in nature last night after his lunch yesterday.  Objective: Vitals:   11/08/20 0332 11/08/20 0733 11/08/20 0755 11/08/20 1144  BP: (!) 159/81  (!) 156/75 (!) 157/79  Pulse: 63  61 64  Resp: 20  18 20   Temp: 98.4 F (36.9 C)  98.9 F (37.2 C) 98.2 F (36.8 C)  TempSrc: Oral     SpO2: 100% 99% 100% 99%  Weight:      Height:       No intake or output data in the 24 hours ending 11/08/20 1540  Filed Weights   11/04/20 1715 11/05/20 0447 11/06/20 0451  Weight: 97.5 kg 101.8 kg 100.3 kg    Examination:   Constitutional: NAD, AAOx3 HEENT: conjunctivae and lids normal, EOMI, hard of hearing CV: No cyanosis.   RESP: normal respiratory effort, on 2L Extremities: No effusions, edema in BLE SKIN: warm, dry Neuro: II - XII grossly intact.   Psych: Normal mood and affect.     Data Reviewed: I have personally reviewed following labs and imaging studies  CBC: Recent Labs  Lab 11/04/20 0920 11/05/20 0409 11/06/20 0349 11/07/20 0518 11/08/20 0426  WBC 11.8* 15.8* 13.4* 11.2* 14.3*  NEUTROABS 8.4*  --   --   --   --   HGB 12.5* 11.7* 12.2* 12.6* 12.2*  HCT 37.0* 35.4* 36.8* 37.6* 36.0*  MCV 93.7 91.7 91.3 90.4 90.2  PLT 223 207 239 279 303   Basic Metabolic Panel: Recent Labs  Lab 11/04/20 1759 11/05/20 0409 11/05/20 2143 11/06/20 0349 11/07/20 0518 11/08/20 0426  NA 133* 133*  --  138 136 135  K 5.0 5.5* 4.5 4.3 3.6 3.4*  CL 106 104  --  105 102 102  CO2 19* 22  --  24 25 27   GLUCOSE 159* 93  --  91 124* 171*  BUN 38* 33*  --  21 14 12   CREATININE 2.37* 1.83*  --  1.18 0.87 0.83  CALCIUM 9.3 9.0  --  9.2 9.4 9.4  MG  --   --  1.9 1.6* 1.9 1.5*  PHOS  --   --  2.7 2.2* 2.6  --    GFR: Estimated Creatinine Clearance: 101.3 mL/min (by C-G formula based on SCr of 0.83 mg/dL). Liver Function Tests: Recent Labs  Lab  11/04/20 0920 11/05/20 0409 11/06/20 0349 11/07/20 0518  AST 31 117* 133* 113*  ALT 19 42 43 46*  ALKPHOS 52 45 45 48  BILITOT 0.9 0.9 0.9 0.7  PROT 7.0 6.2* 6.5 6.7  ALBUMIN 4.0 3.2* 3.5 3.7   No results for input(s): LIPASE, AMYLASE in the last 168 hours. No results for input(s): AMMONIA in the last 168 hours. Coagulation Profile: Recent Labs  Lab 11/04/20 1039  INR 1.1   Cardiac Enzymes: Recent Labs  Lab 11/04/20 1254 11/05/20 0409 11/06/20 0349 11/07/20 0518 11/08/20 0426  CKTOTAL 1,950* 4,065* 3,963* 2,284* 1,072*   BNP (last 3 results) No results for input(s): PROBNP in the last 8760 hours. HbA1C: No results for input(s): HGBA1C in the last 72 hours.  CBG: Recent Labs  Lab 11/07/20 1208 11/07/20 1659 11/07/20 1937  11/08/20 0816 11/08/20 1147  GLUCAP 214* 176* 129* 124* 125*   Lipid Profile: No results for input(s): CHOL, HDL, LDLCALC, TRIG, CHOLHDL, LDLDIRECT in the last 72 hours. Thyroid Function Tests: No results for input(s): TSH, T4TOTAL, FREET4, T3FREE, THYROIDAB in the last 72 hours.  Anemia Panel: No results for input(s): VITAMINB12, FOLATE, FERRITIN, TIBC, IRON, RETICCTPCT in the last 72 hours. Sepsis Labs: Recent Labs  Lab 11/04/20 1038 11/04/20 1254 11/04/20 1524 11/04/20 1759 11/05/20 0409 11/06/20 0349  PROCALCITON  --  <0.10  --   --  <0.10 <0.10  LATICACIDVEN 2.6* 3.0* 2.0* 2.2*  --   --     Recent Results (from the past 240 hour(s))  Resp Panel by RT-PCR (Flu A&B, Covid) Urine, Clean Catch     Status: None   Collection Time: 11/04/20 10:38 AM   Specimen: Urine, Clean Catch; Nasopharyngeal(NP) swabs in vial transport medium  Result Value Ref Range Status   SARS Coronavirus 2 by RT PCR NEGATIVE NEGATIVE Final    Comment: (NOTE) SARS-CoV-2 target nucleic acids are NOT DETECTED.  The SARS-CoV-2 RNA is generally detectable in upper respiratory specimens during the acute phase of infection. The lowest concentration of  SARS-CoV-2 viral copies this assay can detect is 138 copies/mL. A negative result does not preclude SARS-Cov-2 infection and should not be used as the sole basis for treatment or other patient management decisions. A negative result may occur with  improper specimen collection/handling, submission of specimen other than nasopharyngeal swab, presence of viral mutation(s) within the areas targeted by this assay, and inadequate number of viral copies(<138 copies/mL). A negative result must be combined with clinical observations, patient history, and epidemiological information. The expected result is Negative.  Fact Sheet for Patients:  BloggerCourse.com  Fact Sheet for Healthcare Providers:  SeriousBroker.it  This test is no t yet approved or cleared by the [place] FDA and  has been authorized for detection and/or diagnosis of SARS-CoV-2 by FDA under an Emergency Use Authorization (EUA). This EUA will remain  in effect (meaning this test can be used) for the duration of the COVID-19 declaration under Section 564(b)(1) of the Act, 21 U.S.C.section 360bbb-3(b)(1), unless the authorization is terminated  or revoked sooner.       Influenza A by PCR NEGATIVE NEGATIVE Final   Influenza B by PCR NEGATIVE NEGATIVE Final    Comment: (NOTE) The Xpert Xpress SARS-CoV-2/FLU/RSV plus assay is intended as an aid in the diagnosis of influenza from Nasopharyngeal swab specimens and should not be used as a sole basis for treatment. Nasal washings and aspirates are unacceptable for Xpert Xpress SARS-CoV-2/FLU/RSV testing.  Fact Sheet for Patients: BloggerCourse.com  Fact Sheet for Healthcare Providers: SeriousBroker.it  This test is not yet approved or cleared by the [place] FDA and has been authorized for detection and/or diagnosis of SARS-CoV-2 by FDA under an Emergency Use  Authorization (EUA). This EUA will remain in effect (meaning this test can be used) for the duration of the COVID-19 declaration under Section 564(b)(1) of the Act, 21 U.S.C. section 360bbb-3(b)(1), unless the authorization is terminated or revoked.  Performed at [facility], [address] [place], [place] [address]   Blood Culture (routine x 2)     Status: None (Preliminary result)   Collection Time: 11/04/20 10:40 AM   Specimen: BLOOD  Result Value Ref Range Status   Specimen Description BLOOD RIGHT ARM  Final   Special Requests   Final    BOTTLES DRAWN AEROBIC AND  ANAEROBIC Blood Culture adequate volume   Culture   Final    NO GROWTH 4 DAYS Performed at [facility], [address] [place], [place] [address]    Report Status PENDING  Incomplete  Blood Culture (routine x 2)     Status: None (Preliminary result)   Collection Time: 11/04/20 12:54 PM   Specimen: BLOOD  Result Value Ref Range Status   Specimen Description BLOOD FOREARM  Final   Special Requests   Final    BOTTLES DRAWN AEROBIC AND ANAEROBIC Blood Culture adequate volume   Culture   Final    NO GROWTH 4 DAYS Performed at [facility], [address], [place], [place] [address]    Report Status PENDING  Incomplete  MRSA Next Gen by PCR, Nasal     Status: None   Collection Time: 11/04/20  4:30 PM   Specimen: Nasal Mucosa; Nasal Swab  Result Value Ref Range Status   MRSA by PCR Next Gen NOT DETECTED NOT DETECTED Final    Comment: (NOTE) The GeneXpert MRSA Assay (FDA approved for NASAL specimens only), is one component of a comprehensive MRSA colonization surveillance program. It is not intended to diagnose MRSA infection nor to guide or monitor treatment for MRSA infections. Test performance is not FDA approved in patients less than 69 years old. Performed at [facility], [address] [place], [place] [address]   Culture, Respiratory w Gram Stain     Status:  None   Collection Time: 11/04/20  9:43 PM   Specimen: Tracheal Aspirate; Respiratory  Result Value Ref Range Status   Specimen Description   Final    TRACHEAL ASPIRATE Performed at [facility], [address] [place], [place] [address]    Special Requests   Final    NONE Performed at [facility], [address] [place], [place] [address]    Gram Stain   Final    FEW SQUAMOUS EPITHELIAL CELLS PRESENT MODERATE WBC SEEN MODERATE GRAM POSITIVE COCCI Performed at [facility], [address] [place], [place] [address]    Culture RARE PSEUDOMONAS AERUGINOSA  Final   Report Status 11/07/2020 FINAL  Final   Organism ID, Bacteria PSEUDOMONAS AERUGINOSA  Final      Susceptibility   Pseudomonas aeruginosa - MIC*    CEFTAZIDIME 4 SENSITIVE Sensitive     CIPROFLOXACIN <=0.25 SENSITIVE Sensitive     GENTAMICIN 4 SENSITIVE Sensitive     IMIPENEM 2 SENSITIVE Sensitive     PIP/TAZO 8 SENSITIVE Sensitive     CEFEPIME 2 SENSITIVE Sensitive     * RARE PSEUDOMONAS AERUGINOSA      Radiology Studies: No results found.   Scheduled Meds:  arformoterol  15 mcg Nebulization BID   aspirin EC  81 mg Oral Daily   budesonide (PULMICORT) nebulizer solution  0.5 mg Nebulization BID   Chlorhexidine Gluconate Cloth  6 each Topical Q0600   cholecalciferol  1,000 Units Oral Daily   DULoxetine  30 mg Oral Daily   enoxaparin (LOVENOX) injection  40 mg Subcutaneous Q24H   guaiFENesin  600 mg Oral BID   insulin aspart  0-15 Units Subcutaneous TID AC & HS   pantoprazole  40 mg Oral Daily   thiamine  100 mg Oral Q24H   Or   thiamine injection  100 mg Intravenous Q24H   tiZANidine  4 mg Oral TID   vitamin B-12  1,000 mcg Oral Daily   Continuous Infusions:  sodium chloride 500 mL (11/08/20 0710)   sodium chloride 75 mL/hr at 11/08/20 1315   cefTRIAXone (ROCEPHIN)  IV 2 g (11/08/20 0711)   promethazine (PHENERGAN) injection (IM or IVPB) 12.5 mg (11/08/20 0918)     LOS: 4  days     Delfino Lovett, MD [facility] If 7PM-7AM, please contact night-coverage 11/08/2020, 3:40 PM

## 2020-11-09 DIAGNOSIS — N179 Acute kidney failure, unspecified: Secondary | ICD-10-CM | POA: Diagnosis not present

## 2020-11-09 DIAGNOSIS — R4189 Other symptoms and signs involving cognitive functions and awareness: Secondary | ICD-10-CM | POA: Diagnosis not present

## 2020-11-09 DIAGNOSIS — E875 Hyperkalemia: Secondary | ICD-10-CM

## 2020-11-09 DIAGNOSIS — I959 Hypotension, unspecified: Secondary | ICD-10-CM | POA: Diagnosis not present

## 2020-11-09 LAB — BASIC METABOLIC PANEL
Anion gap: 10 (ref 5–15)
BUN: 9 mg/dL (ref 8–23)
CO2: 23 mmol/L (ref 22–32)
Calcium: 9.6 mg/dL (ref 8.9–10.3)
Chloride: 102 mmol/L (ref 98–111)
Creatinine, Ser: 0.89 mg/dL (ref 0.61–1.24)
GFR, Estimated: >60 mL/min (ref >60)
Glucose, Bld: 102 mg/dL — ABNORMAL HIGH (ref 70–99)
Potassium: 3.5 mmol/L (ref 3.5–5.1)
Sodium: 135 mmol/L (ref 135–145)

## 2020-11-09 LAB — CULTURE, BLOOD (ROUTINE X 2)
Culture: NO GROWTH
Culture: NO GROWTH
Special Requests: ADEQUATE
Special Requests: ADEQUATE

## 2020-11-09 LAB — CBC
HCT: 38.2 % — ABNORMAL LOW (ref 39.0–52.0)
Hemoglobin: 12.9 g/dL — ABNORMAL LOW (ref 13.0–17.0)
MCH: 31.3 pg (ref 26.0–34.0)
MCHC: 33.8 g/dL (ref 30.0–36.0)
MCV: 92.7 fL (ref 80.0–100.0)
Platelets: 338 10*3/uL (ref 150–400)
RBC: 4.12 MIL/uL — ABNORMAL LOW (ref 4.22–5.81)
RDW: 13.2 % (ref 11.5–15.5)
WBC: 14.1 10*3/uL — ABNORMAL HIGH (ref 4.0–10.5)
nRBC: 0 % (ref 0.0–0.2)

## 2020-11-09 LAB — MAGNESIUM: Magnesium: 1.6 mg/dL — ABNORMAL LOW (ref 1.7–2.4)

## 2020-11-09 LAB — GLUCOSE, CAPILLARY: Glucose-Capillary: 105 mg/dL — ABNORMAL HIGH (ref 70–99)

## 2020-11-09 LAB — CK: Total CK: 737 U/L — ABNORMAL HIGH (ref 49–397)

## 2020-11-09 NOTE — Discharge Summary (Signed)
5       [place] [facility]   PATIENT NAME: Ethan Frye    MR#:  [id number]  DATE OF BIRTH:  04/30/1950  DATE OF ADMISSION:  11/04/2020   ADMITTING PHYSICIAN: Chilton Greathouse, MD  DATE OF DISCHARGE: 11/09/2020 10:30 AM  PRIMARY CARE PHYSICIAN: Darrick Penna, MD   ADMISSION DIAGNOSIS:  Acute hyperkalemia [E87.5] Drug overdose [T50.901A] Unresponsive [R41.89] AKI (acute kidney injury) (HCC) [N17.9] Hypotension, unspecified hypotension type [I95.9] DISCHARGE DIAGNOSIS:  Active Problems:   Drug overdose   Unresponsive   Acute hyperkalemia   Hypotension   AKI (acute kidney injury) (HCC)  SECONDARY DIAGNOSIS:  History reviewed. No pertinent past medical history. HOSPITAL COURSE:  70 year old male with extensive past medical history admitted for unresponsiveness.  His daughter-in-law found him unresponsive with multiple pill bottles (tramadol, Lyrica, clonazepam, methadone) scattered on his nightstand.  Patient reportedly was started on methadone on 9/17 and was supposed to discontinue Lyrica and clonazepam but not sure if that truly happened.  Family was concerned that patient has been more confused since starting the methadone  Patient received 8 mg of Narcan by EMS without any improvement.  While in the ED he had spontaneous respiration but was somnolent and hypotensive.  Patient was given an additional dose of Narcan without much response.  He remained altered with GCS of 6 with absent gag reflexes requiring intubation by EDP to protect airway.  Patient was admitted by [facility] service for acute metabolic encephalopathy, acute hypoxic respiratory failure suspected due to drug overdose, AKI, hyperkalemia.   Patient was extubated on 9/23 and transferred to hospitalist service on 11/07/2020   Acute on chronic hypoxic respiratory failure due to drug overdose and suspected aspiration. History of pulmonary sarcoidosis OSA not on BiPAP for last 2 years due to  intolerance Chronic hypoxic respiratory failure on 2 L oxygen at home  Left mainstem bronchus stenosis Patient was intubated on admission and extubated on 9/23 Remains on 2 L oxygen via nasal cannula at discharge Trach aspirate culture grew rare Pseudomonas which was thought to be colonization -Patient completed course of antibiotics  Hypotension Likely iatrogenic due to sedation.  Now resolved  Traumatic rhabdomyolysis CK improved with hydration, peaked at ~ 4000   Acute metabolic encephalopathy in the setting of drug overdose (polysubstance) and likely polypharmacy and metabolic derangements His mental status is back to baseline now Normal free T4, TSH Negative CT head on 9/22 UDS positive for benzo, methadone, opiate, tricyclic's.  Elevated serum Tylenol at 73 on admission  Chronic pain with polysubstance dependence Fibromyalgia I discussed his medication regimen with his wife and recommended to see his outpatient treatment team including palliative care to decide final regimen.  At this time I am not changing any of his home medications   AKI present on admission As evidenced by creatinine of 2.95 on admission, likely due to hypotension and rhabdomyolysis Now resolved with hydration   Acute Kidney Injury, resolved --Cr 2.95 on presentation.  Could be due to hypotension, rhabo.  Cr back to baseline after IVF.  Mild anemia, no bleeding  Well-controlled type 2 diabetes A1c 6.2   Isolated fever Transient and resolved.  Could be due to aspiration event   Nausea and vomiting likely due to slow GI motility from narcotics.  Patient was able to tolerate diet without any further vomiting   Patient remains at very high risk for readmission.  I highly recommend patient to get close follow-up with his primary team as an  outpatient and recalibrate his medication regimen specifically controlled substances DISCHARGE CONDITIONS:  Stable CONSULTS OBTAINED:   DRUG ALLERGIES:    Allergies  Allergen Reactions   Penicillins Rash   Ciprofloxacin     Other reaction(s): Dizziness Pt reports he took one dose and fainted, fell and hurt his leg   Azithromycin    DISCHARGE MEDICATIONS:   Allergies as of 11/09/2020       Reactions   Penicillins Rash   Ciprofloxacin    Other reaction(s): Dizziness Pt reports he took one dose and fainted, fell and hurt his leg   Azithromycin         Medication List     STOP taking these medications    nitrofurantoin (macrocrystal-monohydrate) 100 MG capsule Commonly known as: MACROBID       TAKE these medications    acidophilus Caps capsule Take 1 capsule by mouth daily.   albuterol 108 (90 Base) MCG/ACT inhaler Commonly known as: VENTOLIN HFA Inhale 2 puffs into the lungs every 6 (six) hours as needed for wheezing or shortness of breath.   albuterol (2.5 MG/3ML) 0.083% nebulizer solution Commonly known as: PROVENTIL Take 2.5 mg by nebulization every 6 (six) hours as needed for wheezing or shortness of breath.   aspirin EC 81 MG tablet Take 81 mg by mouth daily. Swallow whole.   atorvastatin 20 MG tablet Commonly known as: LIPITOR Take 20 mg by mouth daily.   azithromycin 500 MG tablet Commonly known as: ZITHROMAX Take 500 mg by mouth every Monday, Wednesday, and Friday.   carvedilol 25 MG tablet Commonly known as: COREG Take 25 mg by mouth 2 (two) times daily.   celecoxib 200 MG capsule Commonly known as: CELEBREX Take 200 mg by mouth daily as needed for pain.   cetirizine 10 MG tablet Commonly known as: ZYRTEC Take 10 mg by mouth daily.   cholecalciferol 25 MCG (1000 UNIT) tablet Commonly known as: VITAMIN D Take 1,000 Units by mouth daily.   clonazePAM 1 MG tablet Commonly known as: KLONOPIN Take 1 mg by mouth 2 (two) times daily.   diclofenac Sodium 1 % Gel Commonly known as: VOLTAREN Apply 2 g topically 4 (four) times daily.   doxepin 50 MG capsule Commonly known as:  SINEQUAN Take 150 mg by mouth at bedtime.   DULoxetine 60 MG capsule Commonly known as: CYMBALTA Take 60 mg by mouth daily.   ferrous sulfate 325 (65 FE) MG tablet Take 325 mg by mouth daily with breakfast.   fluticasone-salmeterol 500-50 MCG/ACT Aepb Commonly known as: ADVAIR Inhale 1 puff into the lungs every 12 (twelve) hours.   HYDROcodone-acetaminophen 5-325 MG tablet Commonly known as: NORCO/VICODIN Take 1 tablet by mouth every 4 (four) hours as needed for pain.   lisinopril 40 MG tablet Commonly known as: ZESTRIL Take 40 mg by mouth daily.   metFORMIN 500 MG tablet Commonly known as: GLUCOPHAGE Take 500-1,000 mg by mouth 2 (two) times daily with a meal.   methadone 5 MG tablet Commonly known as: DOLOPHINE Take 2.5-5 mg by mouth 2 (two) times daily. Take 2.5 mg (one-half tablet) twice daily for one week then increase to 5 mg (one tablet) twice daily, thereafter   montelukast 10 MG tablet Commonly known as: SINGULAIR Take 10 mg by mouth daily.   multivitamin with minerals tablet Take 1 tablet by mouth daily.   nystatin 100000 UNIT/ML suspension Commonly known as: MYCOSTATIN Take 2 mLs by mouth in the morning, at noon, in the evening, and  at bedtime.   omeprazole 40 MG capsule Commonly known as: PRILOSEC Take 40 mg by mouth daily.   pimecrolimus 1 % cream Commonly known as: ELIDEL Apply 1 application topically 2 (two) times daily.   polyethylene glycol 17 g packet Commonly known as: MIRALAX / GLYCOLAX Take 17 g by mouth 2 (two) times daily.   pregabalin 75 MG capsule Commonly known as: LYRICA Take 75 mg by mouth 3 (three) times daily.   senna-docusate 8.6-50 MG tablet Commonly known as: Senokot-S Take 1 tablet by mouth daily.   spironolactone 25 MG tablet Commonly known as: ALDACTONE Take 12.5 mg by mouth daily.   SUMAtriptan 50 MG tablet Commonly known as: IMITREX Take 50 mg by mouth as directed.   tiZANidine 4 MG tablet Commonly known as:  ZANAFLEX Take 4 mg by mouth 3 (three) times daily.   traMADol 50 MG tablet Commonly known as: ULTRAM Take 50 mg by mouth every 6 (six) hours as needed for pain.   vitamin B-12 1000 MCG tablet Commonly known as: CYANOCOBALAMIN Take 1,000 mcg by mouth daily.       DISCHARGE INSTRUCTIONS:   DIET:  Cardiac diet DISCHARGE CONDITION:  Stable ACTIVITY:  Activity as tolerated OXYGEN:  Home Oxygen: Yes.    Oxygen Delivery: 2 liters/min via Patient connected to nasal cannula oxygen DISCHARGE LOCATION:  home   If you experience worsening of your admission symptoms, develop shortness of breath, life threatening emergency, suicidal or homicidal thoughts you must seek medical attention immediately by calling 911 or calling your MD immediately  if symptoms less severe.  You Must read complete instructions/literature along with all the possible adverse reactions/side effects for all the Medicines you take and that have been prescribed to you. Take any new Medicines after you have completely understood and accpet all the possible adverse reactions/side effects.   Please note  You were cared for by a hospitalist during your hospital stay. If you have any questions about your discharge medications or the care you received while you were in the hospital after you are discharged, you can call the unit and asked to speak with the hospitalist on call if the hospitalist that took care of you is not available. Once you are discharged, your primary care physician will handle any further medical issues. Please note that NO REFILLS for any discharge medications will be authorized once you are discharged, as it is imperative that you return to your primary care physician (or establish a relationship with a primary care physician if you do not have one) for your aftercare needs so that they can reassess your need for medications and monitor your lab values.    On the day of Discharge:  VITAL SIGNS:  Blood  pressure (!) 123/93, pulse 66, temperature 99.5 F (37.5 C), resp. rate 20, height 5\' 11"  (1.803 m), weight 100.3 kg, SpO2 91 %. PHYSICAL EXAMINATION:  GENERAL:  70 y.o.-year-old patient lying in the bed with no acute distress.  EYES: Pupils equal, round, reactive to light and accommodation. No scleral icterus. Extraocular muscles intact.  HEENT: Head atraumatic, normocephalic. Oropharynx and nasopharynx clear.  NECK:  Supple, no jugular venous distention. No thyroid enlargement, no tenderness.  LUNGS: Normal breath sounds bilaterally, no wheezing, rales,rhonchi or crepitation. No use of accessory muscles of respiration.  CARDIOVASCULAR: S1, S2 normal. No murmurs, rubs, or gallops.  ABDOMEN: Soft, non-tender, non-distended. Bowel sounds present. No organomegaly or mass.  EXTREMITIES: No pedal edema, cyanosis, or clubbing.  NEUROLOGIC: Cranial nerves  II through XII are intact. Muscle strength 5/5 in all extremities. Sensation intact. Gait not checked.  PSYCHIATRIC: The patient is alert and oriented x 3.  SKIN: No obvious rash, lesion, or ulcer.  DATA REVIEW:   CBC Recent Labs  Lab 11/09/20 0445  WBC 14.1*  HGB 12.9*  HCT 38.2*  PLT 338    Chemistries  Recent Labs  Lab 11/07/20 0518 11/08/20 0426 11/09/20 0445  NA 136   < > 135  K 3.6   < > 3.5  CL 102   < > 102  CO2 25   < > 23  GLUCOSE 124*   < > 102*  BUN 14   < > 9  CREATININE 0.87   < > 0.89  CALCIUM 9.4   < > 9.6  MG 1.9   < > 1.6*  AST 113*  --   --   ALT 46*  --   --   ALKPHOS 48  --   --   BILITOT 0.7  --   --    < > = values in this interval not displayed.     Outpatient follow-up  Follow-up Information     Darrick Penna, MD. Go on 11/12/2020.   Specialty: Internal Medicine Why: [facility] Discharge F/UP;Appt with  Dr. Johney Frame Appt @ 11:20 am Contact information: [address] [place] [address] [phone]                 30 Day Unplanned Readmission Risk Score    Flowsheet  Row ED to Hosp-Admission (Discharged) from 11/04/2020 in [facility] (1A)  30 Day Unplanned Readmission Risk Score (%) 14.34 Filed at 11/09/2020 0801       This score is the patient's risk of an unplanned readmission within 30 days of being discharged (0 -100%). The score is based on dignosis, age, lab data, medications, orders, and past utilization.   Low:  0-14.9   Medium: 15-21.9   High: 22-29.9   Extreme: 30 and above          Management plans discussed with the patient, family and they are in agreement.  CODE STATUS: Prior   TOTAL TIME TAKING CARE OF THIS PATIENT: 45 minutes.    Delfino Lovett M.D on 11/09/2020 at 5:29 PM  [facility]   CC: Primary care physician; Darrick Penna, MD   Note: This dictation was prepared with Dragon dictation along with smaller phrase technology. Any transcriptional errors that result from this process are unintentional.

## 2020-11-09 NOTE — Plan of Care (Signed)
  Problem: Clinical Measurements: Goal: Ability to maintain clinical measurements within normal limits will improve Outcome: Progressing   Problem: Clinical Measurements: Goal: Will remain free from infection Outcome: Progressing   Problem: Clinical Measurements: Goal: Diagnostic test results will improve Outcome: Progressing   Problem: Clinical Measurements: Goal: Respiratory complications will improve Outcome: Progressing   

## 2020-11-09 NOTE — Progress Notes (Signed)
Pt d/c home in POV with wife.  All d/c paperwork reviewed with pt and wife and both expressed understanding they are aware of pts follow up appointment.  All personal belongings taken at time of d/c.  IV removed from pts R forearm without issue. NAD noted VSS.

## 2020-11-23 LAB — BLOOD GAS, ARTERIAL
Acid-base deficit: 5.2 mmol/L — ABNORMAL HIGH (ref 0.0–2.0)
Bicarbonate: 20 mmol/L (ref 20.0–28.0)
FIO2: 0.5
O2 Saturation: 99.2 %
Patient temperature: 37
pCO2 arterial: 37 mmHg (ref 32.0–48.0)
pH, Arterial: 7.34 — ABNORMAL LOW (ref 7.350–7.450)
pO2, Arterial: 148 mmHg — ABNORMAL HIGH (ref 83.0–108.0)

## 2022-04-10 ENCOUNTER — Encounter: Payer: Medicare Other | Attending: Pulmonary Disease

## 2022-04-10 ENCOUNTER — Other Ambulatory Visit: Payer: Self-pay

## 2022-04-10 DIAGNOSIS — Z5189 Encounter for other specified aftercare: Secondary | ICD-10-CM | POA: Insufficient documentation

## 2022-04-10 DIAGNOSIS — D869 Sarcoidosis, unspecified: Secondary | ICD-10-CM

## 2022-04-10 NOTE — Progress Notes (Signed)
Virtual Visit completed. Patient informed on EP and RD appointment and 6 Minute walk test. Patient also informed of patient health questionnaires on My Chart. Patient Verbalizes understanding. Visit diagnosis can be found in [facility] 02/01/2022.

## 2022-04-12 ENCOUNTER — Encounter: Payer: Medicare Other | Admitting: *Deleted

## 2022-04-12 VITALS — Ht 70.0 in | Wt 219.7 lb

## 2022-04-12 DIAGNOSIS — D869 Sarcoidosis, unspecified: Secondary | ICD-10-CM

## 2022-04-12 DIAGNOSIS — Z5189 Encounter for other specified aftercare: Secondary | ICD-10-CM | POA: Diagnosis not present

## 2022-04-12 NOTE — Patient Instructions (Signed)
Patient Instructions  Patient Details  Name: Ethan Frye MRN: [id number] Date of Birth: [birth year] Referring Provider:  Eulis Manly, MD  Below are your personal goals for exercise, nutrition, and risk factors. Our goal is to help you stay on track towards obtaining and maintaining these goals. We will be discussing your progress on these goals with you throughout the program.  Initial Exercise Prescription:  Initial Exercise Prescription - 04/12/22 1100       Date of Initial Exercise RX and Referring Provider   Date 04/12/22    Referring Provider Eulis Manly MD      Oxygen   Oxygen --   may need oxygen is desaturates with exercise   Maintain Oxygen Saturation 88% or higher      Recumbant Bike   Level 1    RPM 50    Watts 10    Minutes 15    METs 1.8      T5 Nustep   Level 1    SPM 80    Minutes 15    METs 1.8      Biostep-RELP   Level 1    SPM 50    Minutes 15    METs 2      Track   Laps 21    Minutes 15    METs 2.14      Prescription Details   Frequency (times per week) 2    Duration Progress to 30 minutes of continuous aerobic without signs/symptoms of physical distress      Intensity   THRR 40-80% of Max Heartrate 106-135    Ratings of Perceived Exertion 11-13    Perceived Dyspnea 0-4      Progression   Progression Continue to progress workloads to maintain intensity without signs/symptoms of physical distress.      Resistance Training   Training Prescription Yes    Weight 3 lb    Reps 10-15             Exercise Goals: Frequency: Be able to perform aerobic exercise two to three times per week in program working toward 2-5 days per week of home exercise.  Intensity: Work with a perceived exertion of 11 (fairly light) - 15 (hard) while following your exercise prescription.  We will make changes to your prescription with you as you progress through the program.   Duration: Be able to do 30 to 45 minutes of continuous aerobic  exercise in addition to a 5 minute warm-up and a 5 minute cool-down routine.   Nutrition Goals: Your personal nutrition goals will be established when you do your nutrition analysis with the dietician.  The following are general nutrition guidelines to follow: Cholesterol < '200mg'$ /day Sodium < '1500mg'$ /day Fiber: Men over 50 yrs - 30 grams per day  Personal Goals:  Personal Goals and Risk Factors at Admission - 04/12/22 1257       Core Components/Risk Factors/Patient Goals on Admission    Weight Management Yes;Weight Loss;Obesity    Intervention Weight Management: Develop a combined nutrition and exercise program designed to reach desired caloric intake, while maintaining appropriate intake of nutrient and fiber, sodium and fats, and appropriate energy expenditure required for the weight goal.;Weight Management: Provide education and appropriate resources to help participant work on and attain dietary goals.;Weight Management/Obesity: Establish reasonable short term and long term weight goals.;Obesity: Provide education and appropriate resources to help participant work on and attain dietary goals.    Admit Weight 219 lb 11.2 oz (  99.7 kg)    Goal Weight: Short Term 214 lb (97.1 kg)    Goal Weight: Long Term 200 lb (90.7 kg)    Expected Outcomes Short Term: Continue to assess and modify interventions until short term weight is achieved;Long Term: Adherence to nutrition and physical activity/exercise program aimed toward attainment of established weight goal;Weight Loss: Understanding of general recommendations for a balanced deficit meal plan, which promotes 1-2 lb weight loss per week and includes a negative energy balance of [phone] kcal/d;Understanding recommendations for meals to include 15-35% energy as protein, 25-35% energy from fat, 35-60% energy from carbohydrates, less than '200mg'$  of dietary cholesterol, 20-35 gm of total fiber daily;Understanding of distribution of calorie intake  throughout the day with the consumption of 4-5 meals/snacks    Improve shortness of breath with ADL's Yes    Intervention Provide education, individualized exercise plan and daily activity instruction to help decrease symptoms of SOB with activities of daily living.    Expected Outcomes Short Term: Improve cardiorespiratory fitness to achieve a reduction of symptoms when performing ADLs;Long Term: Be able to perform more ADLs without symptoms or delay the onset of symptoms    Increase knowledge of respiratory medications and ability to use respiratory devices properly  Yes    Intervention Provide education and demonstration as needed of appropriate use of medications, inhalers, and oxygen therapy.    Expected Outcomes Short Term: Achieves understanding of medications use. Understands that oxygen is a medication prescribed by physician. Demonstrates appropriate use of inhaler and oxygen therapy.;Long Term: Maintain appropriate use of medications, inhalers, and oxygen therapy.    Diabetes Yes    Intervention Provide education about signs/symptoms and action to take for hypo/hyperglycemia.;Provide education about proper nutrition, including hydration, and aerobic/resistive exercise prescription along with prescribed medications to achieve blood glucose in normal ranges: Fasting glucose 65-99 mg/dL    Expected Outcomes Short Term: Participant verbalizes understanding of the signs/symptoms and immediate care of hyper/hypoglycemia, proper foot care and importance of medication, aerobic/resistive exercise and nutrition plan for blood glucose control.;Long Term: Attainment of HbA1C < 7%.    Hypertension Yes    Intervention Provide education on lifestyle modifcations including regular physical activity/exercise, weight management, moderate sodium restriction and increased consumption of fresh fruit, vegetables, and low fat dairy, alcohol moderation, and smoking cessation.;Monitor prescription use compliance.     Expected Outcomes Short Term: Continued assessment and intervention until BP is < 140/26m HG in hypertensive participants. < 130/862mHG in hypertensive participants with diabetes, heart failure or chronic kidney disease.;Long Term: Maintenance of blood pressure at goal levels.    Intervention Provide education and support for participant on nutrition & aerobic/resistive exercise along with prescribed medications to achieve LDL '70mg'$ , HDL >'40mg'$ .    Expected Outcomes Short Term: Participant states understanding of desired cholesterol values and is compliant with medications prescribed. Participant is following exercise prescription and nutrition guidelines.;Long Term: Cholesterol controlled with medications as prescribed, with individualized exercise RX and with personalized nutrition plan. Value goals: LDL < '70mg'$ , HDL > 40 mg.             Tobacco Use Initial Evaluation: Social History   Tobacco Use  Smoking Status Former   Packs/day: 0.25   Years: 8.00   Total pack years: 2.00   Types: Cigarettes   Quit date: 03/16/1977   Years since quitting: 45.1   Passive exposure: Past  Smokeless Tobacco Former    Exercise Goals and Review:  Exercise Goals     Row Name  04/12/22 1255             Exercise Goals   Increase Physical Activity Yes       Intervention Provide advice, education, support and counseling about physical activity/exercise needs.;Develop an individualized exercise prescription for aerobic and resistive training based on initial evaluation findings, risk stratification, comorbidities and participant's personal goals.       Expected Outcomes Short Term: Attend rehab on a regular basis to increase amount of physical activity.;Long Term: Add in home exercise to make exercise part of routine and to increase amount of physical activity.;Long Term: Exercising regularly at least 3-5 days a week.       Increase Strength and Stamina Yes       Intervention Provide advice, education,  support and counseling about physical activity/exercise needs.;Develop an individualized exercise prescription for aerobic and resistive training based on initial evaluation findings, risk stratification, comorbidities and participant's personal goals.       Expected Outcomes Short Term: Increase workloads from initial exercise prescription for resistance, speed, and METs.;Short Term: Perform resistance training exercises routinely during rehab and add in resistance training at home;Long Term: Improve cardiorespiratory fitness, muscular endurance and strength as measured by increased METs and functional capacity (6MWT)       Able to understand and use rate of perceived exertion (RPE) scale Yes       Intervention Provide education and explanation on how to use RPE scale       Expected Outcomes Short Term: Able to use RPE daily in rehab to express subjective intensity level;Long Term:  Able to use RPE to guide intensity level when exercising independently       Able to understand and use Dyspnea scale Yes       Intervention Provide education and explanation on how to use Dyspnea scale       Expected Outcomes Short Term: Able to use Dyspnea scale daily in rehab to express subjective sense of shortness of breath during exertion;Long Term: Able to use Dyspnea scale to guide intensity level when exercising independently       Knowledge and understanding of Target Heart Rate Range (THRR) Yes       Intervention Provide education and explanation of THRR including how the numbers were predicted and where they are located for reference       Expected Outcomes Short Term: Able to state/look up THRR;Short Term: Able to use daily as guideline for intensity in rehab;Long Term: Able to use THRR to govern intensity when exercising independently       Able to check pulse independently Yes       Intervention Provide education and demonstration on how to check pulse in carotid and radial arteries.;Review the importance of  being able to check your own pulse for safety during independent exercise       Expected Outcomes Short Term: Able to explain why pulse checking is important during independent exercise;Long Term: Able to check pulse independently and accurately       Understanding of Exercise Prescription Yes       Intervention Provide education, explanation, and written materials on patient's individual exercise prescription       Expected Outcomes Short Term: Able to explain program exercise prescription;Long Term: Able to explain home exercise prescription to exercise independently              Copy of goals given to participant.

## 2022-04-12 NOTE — Progress Notes (Signed)
Patient and his wife would like to get a portable concentrator for him to wear to exercise at home and for going out and about.  His saturations were able to stay at and above 89%.  We will continue to monitor how he does as we increase his exercise duration.   04/12/22 1141  6 Minute Walk  Phase Initial  Distance 840 feet  Walk Time 6 minutes  # of Rest Breaks 0  MPH 1.59  METS 1.86  RPE 17  Perceived Dyspnea  3  VO2 Peak 6.51  Symptoms Yes (comment)  Comments SOB, chronic hip pain  Resting HR 78 bpm  Resting BP 118/68  Resting Oxygen Saturation  94 %  Exercise Oxygen Saturation  during 6 min walk 89 %  Max Ex. HR 92 bpm  Max Ex. BP 146/74  2 Minute Post BP 136/72  Interval HR  Interval Heart Rate? Yes  1 Minute HR 86  2 Minute HR 91  3 Minute HR 90  4 Minute HR 91  5 Minute HR 92  6 Minute HR 92  2 Minute Post HR 78  Interval Oxygen  Interval Oxygen? Yes  Baseline Oxygen Saturation % 94 %  1 Minute Oxygen Saturation % 90 %  1 Minute Liters of Oxygen 0 L (Room Air)  2 Minute Oxygen Saturation % 90 %  2 Minute Liters of Oxygen 0 L  3 Minute Oxygen Saturation % 89 %  3 Minute Liters of Oxygen 0 L  4 Minute Oxygen Saturation % 89 %  4 Minute Liters of Oxygen 0 L  5 Minute Oxygen Saturation % 90 %  5 Minute Liters of Oxygen 0 L  6 Minute Oxygen Saturation % 90 %  6 Minute Liters of Oxygen 0 L  2 Minute Post Oxygen Saturation % 94 %  2 Minute Post Liters of Oxygen 0 L   Alberteen Sam, MA, [place], CCRP 04/12/2022 11:50 AM

## 2022-04-12 NOTE — Progress Notes (Signed)
Pulmonary Individual Treatment Plan  Patient Details  Name: Ethan Frye MRN: [id number] Date of Birth: February 16, 1950 Referring Provider:   Flowsheet Row Pulmonary Rehab from 04/12/2022 in [facility] Cardiac and Pulmonary Rehab  Referring Provider Eulis Manly MD       Initial Encounter Date:  Flowsheet Row Pulmonary Rehab from 04/12/2022 in [facility] Cardiac and Pulmonary Rehab  Date 04/12/22       Visit Diagnosis: Sarcoidosis  Patient's Home Medications on Admission:  Current Outpatient Medications:    acidophilus (RISAQUAD) CAPS capsule, Take 1 capsule by mouth daily., Disp: , Rfl:    albuterol (PROVENTIL) (2.5 MG/3ML) 0.083% nebulizer solution, Take 2.5 mg by nebulization every 6 (six) hours as needed for wheezing or shortness of breath. (Patient not taking: Reported on 04/10/2022), Disp: , Rfl:    albuterol (VENTOLIN HFA) 108 (90 Base) MCG/ACT inhaler, Inhale 2 puffs into the lungs every 6 (six) hours as needed for wheezing or shortness of breath., Disp: , Rfl:    aspirin EC 81 MG tablet, Take 81 mg by mouth daily. Swallow whole., Disp: , Rfl:    aspirin EC 81 MG tablet, Take by mouth. (Patient not taking: Reported on 04/10/2022), Disp: , Rfl:    atorvastatin (LIPITOR) 20 MG tablet, Take 20 mg by mouth daily. (Patient not taking: Reported on 04/10/2022), Disp: , Rfl:    atorvastatin (LIPITOR) 40 MG tablet, Take by mouth., Disp: , Rfl:    azithromycin (ZITHROMAX) 250 MG tablet, Take 1 tablet by mouth every Monday, Wednesday and Friday, Disp: , Rfl:    azithromycin (ZITHROMAX) 500 MG tablet, Take 500 mg by mouth every Monday, Wednesday, and Friday. (Patient not taking: Reported on 04/10/2022), Disp: , Rfl:    Blood Glucose Monitoring Suppl (FIFTY50 GLUCOSE METER 2.0) w/Device KIT, See admin instructions., Disp: , Rfl:    budesonide (PULMICORT) 0.5 MG/2ML nebulizer solution, Inhale into the lungs., Disp: , Rfl:    carvedilol (COREG) 25 MG tablet, Take 25 mg by mouth 2 (two) times daily.  (Patient not taking: Reported on 04/10/2022), Disp: , Rfl:    carvedilol (COREG) 25 MG tablet, Take 1 tablet by mouth 2 (two) times daily with a meal., Disp: , Rfl:    celecoxib (CELEBREX) 200 MG capsule, Take 200 mg by mouth daily as needed for pain. (Patient not taking: Reported on 04/10/2022), Disp: , Rfl:    celecoxib (CELEBREX) 200 MG capsule, Take by mouth., Disp: , Rfl:    cetirizine (ZYRTEC) 10 MG tablet, Take 10 mg by mouth daily., Disp: , Rfl:    cholecalciferol (VITAMIN D) 25 MCG (1000 UNIT) tablet, Take 1,000 Units by mouth daily., Disp: , Rfl:    clonazePAM (KLONOPIN) 1 MG tablet, Take 1 mg by mouth 2 (two) times daily. (Patient not taking: Reported on 04/10/2022), Disp: , Rfl:    clopidogrel (PLAVIX) 75 MG tablet, clopidogrel 75 mg tablet  TAKE 1 TABLET (75 MG TOTAL) BY MOUTH ONCE DAILY., Disp: , Rfl:    Cysteamine Bitartrate (PROCYSBI) 300 MG PACK, Use 1 each 3 (three) times daily Use as instructed., Disp: , Rfl:    diclofenac Sodium (VOLTAREN) 1 % GEL, Apply 2 g topically 4 (four) times daily. (Patient not taking: Reported on 04/10/2022), Disp: , Rfl:    diclofenac Sodium (VOLTAREN) 1 % GEL, Apply 2 g topically 4 (four) times daily., Disp: , Rfl:    doxepin (SINEQUAN) 50 MG capsule, Take 150 mg by mouth at bedtime. (Patient not taking: Reported on 04/10/2022), Disp: , Rfl:  doxepin (SINEQUAN) 50 MG capsule, Take by mouth., Disp: , Rfl:    DULoxetine (CYMBALTA) 60 MG capsule, Take 60 mg by mouth daily. (Patient not taking: Reported on 04/10/2022), Disp: , Rfl:    DULoxetine (CYMBALTA) 60 MG capsule, Take 1 tablet by mouth daily., Disp: , Rfl:    ferrous sulfate 325 (65 FE) MG tablet, Take 325 mg by mouth daily with breakfast., Disp: , Rfl:    fluticasone-salmeterol (ADVAIR) 500-50 MCG/ACT AEPB, Inhale 1 puff into the lungs every 12 (twelve) hours., Disp: , Rfl:    glucose blood (PRECISION QID TEST) test strip, Use 1 each (1 strip total) once daily Use as instructed., Disp: , Rfl:     HYDROcodone-acetaminophen (NORCO/VICODIN) 5-325 MG tablet, Take 1 tablet by mouth every 4 (four) hours as needed for pain., Disp: , Rfl:    ketoconazole (NIZORAL) 2 % cream, ketoconazole 2 % topical cream  APPLY TWICE DAILY TO RASH ON FACE AND GROIN, Disp: , Rfl:    lisinopril (ZESTRIL) 40 MG tablet, Take 40 mg by mouth daily., Disp: , Rfl:    losartan-hydrochlorothiazide (HYZAAR) 100-25 MG tablet, Take 1 tablet by mouth daily., Disp: , Rfl:    metFORMIN (GLUCOPHAGE) 500 MG tablet, Take 500-1,000 mg by mouth 2 (two) times daily with a meal. (Patient not taking: Reported on 04/10/2022), Disp: , Rfl:    metFORMIN (GLUCOPHAGE) 500 MG tablet, Take by mouth., Disp: , Rfl:    methadone (DOLOPHINE) 5 MG tablet, Take 2.5-5 mg by mouth 2 (two) times daily. Take 2.5 mg (one-half tablet) twice daily for one week then increase to 5 mg (one tablet) twice daily, thereafter, Disp: , Rfl:    montelukast (SINGULAIR) 10 MG tablet, Take 10 mg by mouth daily. (Patient not taking: Reported on 04/10/2022), Disp: , Rfl:    montelukast (SINGULAIR) 10 MG tablet, Take 1 tablet by mouth daily., Disp: , Rfl:    moxifloxacin (VIGAMOX) 0.5 % ophthalmic solution, moxifloxacin 0.5 % eye drops  PUT 1 DROP IN RIGHT EYE 4 TIMES A DAY FOR 7 DAYS OR AS DIRECTED, Disp: , Rfl:    Multiple Vitamins-Minerals (MULTIVITAMIN WITH MINERALS) tablet, Take 1 tablet by mouth daily., Disp: , Rfl:    naloxone (NARCAN) nasal spray 4 mg/0.1 mL, Place into the nose., Disp: , Rfl:    nitroGLYCERIN (NITROSTAT) 0.4 MG SL tablet, nitroglycerin 0.4 mg sublingual tablet  PLACE 1 TABLET UNDER THE TONGUE EVERY 5MIS FOR CHEST PAIN, MAY TAKE UP TO 3 DOSES, Disp: , Rfl:    nystatin (MYCOSTATIN) 100000 UNIT/ML suspension, Take 2 mLs by mouth in the morning, at noon, in the evening, and at bedtime. (Patient not taking: Reported on 04/10/2022), Disp: , Rfl:    nystatin (MYCOSTATIN) 100000 UNIT/ML suspension, nystatin 100,000 unit/mL oral suspension  SWISH AND SWALLOW 5  MLS 4 (FOUR) TIMES DAILY., Disp: , Rfl:    omeprazole (PRILOSEC) 40 MG capsule, Take 40 mg by mouth daily. (Patient not taking: Reported on 04/10/2022), Disp: , Rfl:    omeprazole (PRILOSEC) 40 MG capsule, Take 1 tablet by mouth daily., Disp: , Rfl:    oxyCODONE (OXY IR/ROXICODONE) 5 MG immediate release tablet, Take by mouth., Disp: , Rfl:    pimecrolimus (ELIDEL) 1 % cream, Apply 1 application topically 2 (two) times daily., Disp: , Rfl:    pimecrolimus (ELIDEL) 1 % cream, Elidel 1 % topical cream  APPLY TWICE DAILY TO AFFECTED AREA(S) FOR 1-2 WEEKS (Patient not taking: Reported on 04/10/2022), Disp: , Rfl:  polyethylene glycol (MIRALAX / GLYCOLAX) 17 g packet, Take 17 g by mouth 2 (two) times daily. (Patient not taking: Reported on 04/10/2022), Disp: , Rfl:    polyethylene glycol powder (GLYCOLAX/MIRALAX) 17 GM/SCOOP powder, Take by mouth., Disp: , Rfl:    pregabalin (LYRICA) 75 MG capsule, Take 75 mg by mouth 3 (three) times daily., Disp: , Rfl:    Semaglutide,0.25 or 0.'5MG'$ /DOS, (OZEMPIC, 0.25 OR 0.5 MG/DOSE,) 2 MG/3ML SOPN, Inject into the skin., Disp: , Rfl:    senna-docusate (SENOKOT-S) 8.6-50 MG tablet, Take 1 tablet by mouth daily. (Patient not taking: Reported on 04/10/2022), Disp: , Rfl:    senna-docusate (SENOKOT-S) 8.6-50 MG tablet, Take 2 tablets by mouth daily., Disp: , Rfl:    spironolactone (ALDACTONE) 25 MG tablet, Take 12.5 mg by mouth daily., Disp: , Rfl:    SUMAtriptan (IMITREX) 50 MG tablet, Take 50 mg by mouth as directed., Disp: , Rfl:    tiZANidine (ZANAFLEX) 4 MG tablet, Take 4 mg by mouth 3 (three) times daily., Disp: , Rfl:    traMADol (ULTRAM) 50 MG tablet, Take 50 mg by mouth every 6 (six) hours as needed for pain., Disp: , Rfl:    vitamin B-12 (CYANOCOBALAMIN) 1000 MCG tablet, Take 1,000 mcg by mouth daily., Disp: , Rfl:   Past Medical History: No past medical history on file.  Tobacco Use: Social History   Tobacco Use  Smoking Status Former   Packs/day: 0.25    Years: 8.00   Total pack years: 2.00   Types: Cigarettes   Quit date: 03/16/1977   Years since quitting: 45.1   Passive exposure: Past  Smokeless Tobacco Former    Labs: Insurance account manager       Latest Ref Rng & Units 11/04/2020 11/05/2020  Labs for ITP Cardiac and Pulmonary Rehab  Hemoglobin A1c 4.8 - 5.6 % - 6.2   PH, Arterial 7.350 - 7.450 7.24  7.34   PCO2 arterial 32.0 - 48.0 mmHg 44  37   Bicarbonate 20.0 - 28.0 mmol/L 20.2  18.9  20.0   Acid-base deficit 0.0 - 2.0 mmol/L 6.9  8.2  5.2   O2 Saturation % 75.8  89.5  99.2      Pulmonary Assessment Scores:  Pulmonary Assessment Scores     Row Name 04/12/22 1259         ADL UCSD   ADL Phase Entry     SOB Score total 91     Rest 3     Walk 4     Stairs 5     Bath 2     Dress 3     Shop 4       CAT Score   CAT Score 32       mMRC Score   mMRC Score 4              UCSD: Self-administered rating of dyspnea associated with activities of daily living (ADLs) 6-point scale (0 = "not at all" to 5 = "maximal or unable to do because of breathlessness")  Scoring Scores range from 0 to 120.  Minimally important difference is 5 units  CAT: CAT can identify the health impairment of COPD patients and is better correlated with disease progression.  CAT has a scoring range of zero to 40. The CAT score is classified into four groups of low (less than 10), medium (10 - 20), high (21-30) and very high (31-40) based on the impact level of disease on health status. A  CAT score over 10 suggests significant symptoms.  A worsening CAT score could be explained by an exacerbation, poor medication adherence, poor inhaler technique, or progression of COPD or comorbid conditions.  CAT MCID is 2 points  mMRC: mMRC (Modified Medical Research Council) Dyspnea Scale is used to assess the degree of baseline functional disability in patients of respiratory disease due to dyspnea. No minimal important difference is established. A decrease in  score of 1 point or greater is considered a positive change.   Pulmonary Function Assessment:  Pulmonary Function Assessment - 04/12/22 1259       Breath   Shortness of Breath Yes;Limiting activity;Panic with Shortness of Breath             Exercise Target Goals: Exercise Program Goal: Individual exercise prescription set using results from initial 6 min walk test and THRR while considering  patient's activity barriers and safety.   Exercise Prescription Goal: Initial exercise prescription builds to 30-45 minutes a day of aerobic activity, 2-3 days per week.  Home exercise guidelines will be given to patient during program as part of exercise prescription that the participant will acknowledge.  Education: Aerobic Exercise: - Group verbal and visual presentation on the components of exercise prescription. Introduces F.I.T.T principle from ACSM for exercise prescriptions.  Reviews F.I.T.T. principles of aerobic exercise including progression. Written material given at graduation. Flowsheet Row Pulmonary Rehab from 04/12/2022 in [facility] Cardiac and Pulmonary Rehab  Education need identified 04/12/22       Education: Resistance Exercise: - Group verbal and visual presentation on the components of exercise prescription. Introduces F.I.T.T principle from ACSM for exercise prescriptions  Reviews F.I.T.T. principles of resistance exercise including progression. Written material given at graduation.    Education: Exercise & Equipment Safety: - Individual verbal instruction and demonstration of equipment use and safety with use of the equipment. Flowsheet Row Pulmonary Rehab from 04/12/2022 in [facility] Cardiac and Pulmonary Rehab  Date 04/10/22  Educator [facility]  Instruction Review Code 1- Verbalizes Understanding       Education: Exercise Physiology & General Exercise Guidelines: - Group verbal and written instruction with models to review the exercise physiology of the cardiovascular system  and associated critical values. Provides general exercise guidelines with specific guidelines to those with heart or lung disease.    Education: Flexibility, Balance, Mind/Body Relaxation: - Group verbal and visual presentation with interactive activity on the components of exercise prescription. Introduces F.I.T.T principle from ACSM for exercise prescriptions. Reviews F.I.T.T. principles of flexibility and balance exercise training including progression. Also discusses the mind body connection.  Reviews various relaxation techniques to help reduce and manage stress (i.e. Deep breathing, progressive muscle relaxation, and visualization). Balance handout provided to take home. Written material given at graduation.   Activity Barriers & Risk Stratification:  Activity Barriers & Cardiac Risk Stratification - 04/12/22 1154       Activity Barriers & Cardiac Risk Stratification   Activity Barriers Arthritis;Fibromyalgia;Joint Problems;Shortness of Breath;Muscular Weakness;Deconditioning;Balance Concerns;History of Falls;Assistive Device   chronic pain from fibromyalgia and RA in hands/shoulders/knees, fell at home on Sunday bruised left side            6 Minute Walk:  6 Minute Walk     Row Name 04/12/22 1141         6 Minute Walk   Phase Initial     Distance 840 feet     Walk Time 6 minutes     # of Rest Breaks 0  MPH 1.59     METS 1.86     RPE 17     Perceived Dyspnea  3     VO2 Peak 6.51     Symptoms Yes (comment)     Comments SOB, chronic hip pain     Resting HR 78 bpm     Resting BP 118/68     Resting Oxygen Saturation  94 %     Exercise Oxygen Saturation  during 6 min walk 89 %     Max Ex. HR 92 bpm     Max Ex. BP 146/74     2 Minute Post BP 136/72       Interval HR   1 Minute HR 86     2 Minute HR 91     3 Minute HR 90     4 Minute HR 91     5 Minute HR 92     6 Minute HR 92     2 Minute Post HR 78     Interval Heart Rate? Yes       Interval Oxygen    Interval Oxygen? Yes     Baseline Oxygen Saturation % 94 %     1 Minute Oxygen Saturation % 90 %     1 Minute Liters of Oxygen 0 L  Room Air     2 Minute Oxygen Saturation % 90 %     2 Minute Liters of Oxygen 0 L     3 Minute Oxygen Saturation % 89 %     3 Minute Liters of Oxygen 0 L     4 Minute Oxygen Saturation % 89 %     4 Minute Liters of Oxygen 0 L     5 Minute Oxygen Saturation % 90 %     5 Minute Liters of Oxygen 0 L     6 Minute Oxygen Saturation % 90 %     6 Minute Liters of Oxygen 0 L     2 Minute Post Oxygen Saturation % 94 %     2 Minute Post Liters of Oxygen 0 L             Oxygen Initial Assessment:  Oxygen Initial Assessment - 04/10/22 0952       Home Oxygen   Home Oxygen Device Home Concentrator    Sleep Oxygen Prescription Continuous;CPAP   does not wear   Liters per minute 0    Home Exercise Oxygen Prescription Continuous    Liters per minute 2   prn   Home Resting Oxygen Prescription Continuous    Liters per minute 2   prn   Compliance with Home Oxygen Use No      Initial 6 min Walk   Oxygen Used None      Program Oxygen Prescription   Program Oxygen Prescription None      Intervention   Short Term Goals To learn and exhibit compliance with exercise, home and travel O2 prescription;To learn and understand importance of maintaining oxygen saturations>88%;To learn and demonstrate proper use of respiratory medications;To learn and demonstrate proper pursed lip breathing techniques or other breathing techniques. ;To learn and understand importance of monitoring SPO2 with pulse oximeter and demonstrate accurate use of the pulse oximeter.    Long  Term Goals Verbalizes importance of monitoring SPO2 with pulse oximeter and return demonstration;Exhibits proper breathing techniques, such as pursed lip breathing or other method taught during program session;Demonstrates proper use of MDI's;Compliance with respiratory medication;Maintenance  of O2  saturations>88%;Exhibits compliance with exercise, home  and travel O2 prescription             Oxygen Re-Evaluation:   Oxygen Discharge (Final Oxygen Re-Evaluation):   Initial Exercise Prescription:  Initial Exercise Prescription - 04/12/22 1100       Date of Initial Exercise RX and Referring Provider   Date 04/12/22    Referring Provider Eulis Manly MD      Oxygen   Oxygen --   may need oxygen is desaturates with exercise   Maintain Oxygen Saturation 88% or higher      Recumbant Bike   Level 1    RPM 50    Watts 10    Minutes 15    METs 1.8      T5 Nustep   Level 1    SPM 80    Minutes 15    METs 1.8      Biostep-RELP   Level 1    SPM 50    Minutes 15    METs 2      Track   Laps 21    Minutes 15    METs 2.14      Prescription Details   Frequency (times per week) 2    Duration Progress to 30 minutes of continuous aerobic without signs/symptoms of physical distress      Intensity   THRR 40-80% of Max Heartrate 106-135    Ratings of Perceived Exertion 11-13    Perceived Dyspnea 0-4      Progression   Progression Continue to progress workloads to maintain intensity without signs/symptoms of physical distress.      Resistance Training   Training Prescription Yes    Weight 3 lb    Reps 10-15             Perform Capillary Blood Glucose checks as needed.  Exercise Prescription Changes:   Exercise Prescription Changes     Row Name 04/12/22 1100             Response to Exercise   Blood Pressure (Admit) 118/68       Blood Pressure (Exercise) 146/74       Blood Pressure (Exit) 142/60       Heart Rate (Admit) 78 bpm       Heart Rate (Exercise) 92 bpm       Heart Rate (Exit) 74 bpm       Oxygen Saturation (Admit) 94 %       Oxygen Saturation (Exercise) 89 %       Oxygen Saturation (Exit) 94 %       Rating of Perceived Exertion (Exercise) 17       Perceived Dyspnea (Exercise) 3       Symptoms SOB, hip pain       Comments walk  test results                Exercise Comments:   Exercise Goals and Review:   Exercise Goals     Row Name 04/12/22 1255             Exercise Goals   Increase Physical Activity Yes       Intervention Provide advice, education, support and counseling about physical activity/exercise needs.;Develop an individualized exercise prescription for aerobic and resistive training based on initial evaluation findings, risk stratification, comorbidities and participant's personal goals.       Expected Outcomes Short Term: Attend rehab on a regular basis to  increase amount of physical activity.;Long Term: Add in home exercise to make exercise part of routine and to increase amount of physical activity.;Long Term: Exercising regularly at least 3-5 days a week.       Increase Strength and Stamina Yes       Intervention Provide advice, education, support and counseling about physical activity/exercise needs.;Develop an individualized exercise prescription for aerobic and resistive training based on initial evaluation findings, risk stratification, comorbidities and participant's personal goals.       Expected Outcomes Short Term: Increase workloads from initial exercise prescription for resistance, speed, and METs.;Short Term: Perform resistance training exercises routinely during rehab and add in resistance training at home;Long Term: Improve cardiorespiratory fitness, muscular endurance and strength as measured by increased METs and functional capacity (6MWT)       Able to understand and use rate of perceived exertion (RPE) scale Yes       Intervention Provide education and explanation on how to use RPE scale       Expected Outcomes Short Term: Able to use RPE daily in rehab to express subjective intensity level;Long Term:  Able to use RPE to guide intensity level when exercising independently       Able to understand and use Dyspnea scale Yes       Intervention Provide education and explanation on  how to use Dyspnea scale       Expected Outcomes Short Term: Able to use Dyspnea scale daily in rehab to express subjective sense of shortness of breath during exertion;Long Term: Able to use Dyspnea scale to guide intensity level when exercising independently       Knowledge and understanding of Target Heart Rate Range (THRR) Yes       Intervention Provide education and explanation of THRR including how the numbers were predicted and where they are located for reference       Expected Outcomes Short Term: Able to state/look up THRR;Short Term: Able to use daily as guideline for intensity in rehab;Long Term: Able to use THRR to govern intensity when exercising independently       Able to check pulse independently Yes       Intervention Provide education and demonstration on how to check pulse in carotid and radial arteries.;Review the importance of being able to check your own pulse for safety during independent exercise       Expected Outcomes Short Term: Able to explain why pulse checking is important during independent exercise;Long Term: Able to check pulse independently and accurately       Understanding of Exercise Prescription Yes       Intervention Provide education, explanation, and written materials on patient's individual exercise prescription       Expected Outcomes Short Term: Able to explain program exercise prescription;Long Term: Able to explain home exercise prescription to exercise independently                Exercise Goals Re-Evaluation :   Discharge Exercise Prescription (Final Exercise Prescription Changes):  Exercise Prescription Changes - 04/12/22 1100       Response to Exercise   Blood Pressure (Admit) 118/68    Blood Pressure (Exercise) 146/74    Blood Pressure (Exit) 142/60    Heart Rate (Admit) 78 bpm    Heart Rate (Exercise) 92 bpm    Heart Rate (Exit) 74 bpm    Oxygen Saturation (Admit) 94 %    Oxygen Saturation (Exercise) 89 %    Oxygen Saturation  (Exit) 94 %  Rating of Perceived Exertion (Exercise) 17    Perceived Dyspnea (Exercise) 3    Symptoms SOB, hip pain    Comments walk test results             Nutrition:  Target Goals: Understanding of nutrition guidelines, daily intake of sodium '1500mg'$ , cholesterol '200mg'$ , calories 30% from fat and 7% or less from saturated fats, daily to have 5 or more servings of fruits and vegetables.  Education: All About Nutrition: -Group instruction provided by verbal, written material, interactive activities, discussions, models, and posters to present general guidelines for heart healthy nutrition including fat, fiber, MyPlate, the role of sodium in heart healthy nutrition, utilization of the nutrition label, and utilization of this knowledge for meal planning. Follow up email sent as well. Written material given at graduation. Flowsheet Row Pulmonary Rehab from 04/12/2022 in [facility] Cardiac and Pulmonary Rehab  Education need identified 04/12/22       Biometrics:  Pre Biometrics - 04/12/22 1256       Pre Biometrics   Height '5\' 10"'$  (1.778 m)    Weight 219 lb 11.2 oz (99.7 kg)    Waist Circumference 41 inches    Hip Circumference 40 inches    Waist to Hip Ratio 1.03 %    BMI (Calculated) 31.52    Single Leg Stand 1.4 seconds              Nutrition Therapy Plan and Nutrition Goals:  Nutrition Therapy & Goals - 04/12/22 1256       Intervention Plan   Intervention Prescribe, educate and counsel regarding individualized specific dietary modifications aiming towards targeted core components such as weight, hypertension, lipid management, diabetes, heart failure and other comorbidities.    Expected Outcomes Short Term Goal: Understand basic principles of dietary content, such as calories, fat, sodium, cholesterol and nutrients.;Short Term Goal: A plan has been developed with personal nutrition goals set during dietitian appointment.;Long Term Goal: Adherence to prescribed nutrition  plan.             Nutrition Assessments:  MEDIFICTS Score Key: ?70 Need to make dietary changes  40-70 Heart Healthy Diet ? 40 Therapeutic Level Cholesterol Diet  Flowsheet Row Pulmonary Rehab from 04/12/2022 in [facility] Cardiac and Pulmonary Rehab  Picture Your Plate Total Score on Admission 44      Picture Your Plate Scores: [id number] Unhealthy dietary pattern with much room for improvement. 41-50 Dietary pattern unlikely to meet recommendations for good health and room for improvement. 51-60 More healthful dietary pattern, with some room for improvement.  >60 Healthy dietary pattern, although there may be some specific behaviors that could be improved.   Nutrition Goals Re-Evaluation:   Nutrition Goals Discharge (Final Nutrition Goals Re-Evaluation):   Psychosocial: Target Goals: Acknowledge presence or absence of significant depression and/or stress, maximize coping skills, provide positive support system. Participant is able to verbalize types and ability to use techniques and skills needed for reducing stress and depression.   Education: Stress, Anxiety, and Depression - Group verbal and visual presentation to define topics covered.  Reviews how body is impacted by stress, anxiety, and depression.  Also discusses healthy ways to reduce stress and to treat/manage anxiety and depression.  Written material given at graduation.   Education: Sleep Hygiene -Provides group verbal and written instruction about how sleep can affect your health.  Define sleep hygiene, discuss sleep cycles and impact of sleep habits. Review good sleep hygiene tips.    Initial Review & Psychosocial Screening:  Initial Psych Review & Screening - 04/10/22 0956       Initial Review   Current issues with Current Anxiety/Panic;History of Depression;Current Psychotropic Meds;Current Sleep Concerns      Family Dynamics   Good Support System? Yes    Comments Tawan states that his health and pain levels  are the source of his depression and anxiety. He states he feels bad all the time. Al can look to his wife, son and other family for support.      Barriers   Psychosocial barriers to participate in program The patient should benefit from training in stress management and relaxation.      Screening Interventions   Interventions Encouraged to exercise;To provide support and resources with identified psychosocial needs;Provide feedback about the scores to participant    Expected Outcomes Short Term goal: Utilizing psychosocial counselor, staff and physician to assist with identification of specific Stressors or current issues interfering with healing process. Setting desired goal for each stressor or current issue identified.;Long Term Goal: Stressors or current issues are controlled or eliminated.;Short Term goal: Identification and review with participant of any Quality of Life or Depression concerns found by scoring the questionnaire.;Long Term goal: The participant improves quality of Life and PHQ9 Scores as seen by post scores and/or verbalization of changes             Quality of Life Scores:  Scores of 19 and below usually indicate a poorer quality of life in these areas.  A difference of  2-3 points is a clinically meaningful difference.  A difference of 2-3 points in the total score of the Quality of Life Index has been associated with significant improvement in overall quality of life, self-image, physical symptoms, and general health in studies assessing change in quality of life.  PHQ-9: Review Flowsheet       04/12/2022  Depression screen PHQ 2/9  Decreased Interest 2  Down, Depressed, Hopeless 1  PHQ - 2 Score 3  Altered sleeping 2  Tired, decreased energy 2  Change in appetite 2  Feeling bad or failure about yourself  0  Trouble concentrating 0  Moving slowly or fidgety/restless 1  Suicidal thoughts 0  PHQ-9 Score 10  Difficult doing work/chores Somewhat difficult    Interpretation of Total Score  Total Score Depression Severity:  1-4 = Minimal depression, 5-9 = Mild depression, 10-14 = Moderate depression, 15-19 = Moderately severe depression, 20-27 = Severe depression   Psychosocial Evaluation and Intervention:  Psychosocial Evaluation - 04/10/22 0958       Psychosocial Evaluation & Interventions   Interventions Encouraged to exercise with the program and follow exercise prescription;Relaxation education;Stress management education    Comments Jelon states that his health and pain levels are the source of his depression and anxiety. He states he feels bad all the time. Al can look to his wife, son and other family for support.    Expected Outcomes Short: Start [facility] to help with mood. Long: Maintain a healthy mental state.    Continue Psychosocial Services  Follow up required by staff             Psychosocial Re-Evaluation:   Psychosocial Discharge (Final Psychosocial Re-Evaluation):   Education: Education Goals: Education classes will be provided on a weekly basis, covering required topics. Participant will state understanding/return demonstration of topics presented.  Learning Barriers/Preferences:  Learning Barriers/Preferences - 04/12/22 1257       Learning Barriers/Preferences   Learning Barriers Hearing    Learning  Preferences None             General Pulmonary Education Topics:  Infection Prevention: - Provides verbal and written material to individual with discussion of infection control including proper hand washing and proper equipment cleaning during exercise session. Flowsheet Row Pulmonary Rehab from 04/12/2022 in [facility] Cardiac and Pulmonary Rehab  Date 04/10/22  Educator [facility]  Instruction Review Code 1- Verbalizes Understanding       Falls Prevention: - Provides verbal and written material to individual with discussion of falls prevention and safety. Flowsheet Row Pulmonary Rehab from 04/12/2022 in  [facility] Cardiac and Pulmonary Rehab  Date 04/10/22  Educator [facility]  Instruction Review Code 1- Verbalizes Understanding       Chronic Lung Disease Review: - Group verbal instruction with posters, models, PowerPoint presentations and videos,  to review new updates, new respiratory medications, new advancements in procedures and treatments. Providing information on websites and "800" numbers for continued self-education. Includes information about supplement oxygen, available portable oxygen systems, continuous and intermittent flow rates, oxygen safety, concentrators, and Medicare reimbursement for oxygen. Explanation of Pulmonary Drugs, including class, frequency, complications, importance of spacers, rinsing mouth after steroid MDI's, and proper cleaning methods for nebulizers. Review of basic lung anatomy and physiology related to function, structure, and complications of lung disease. Review of risk factors. Discussion about methods for diagnosing sleep apnea and types of masks and machines for OSA. Includes a review of the use of types of environmental controls: home humidity, furnaces, filters, dust mite/pet prevention, HEPA vacuums. Discussion about weather changes, air quality and the benefits of nasal washing. Instruction on Warning signs, infection symptoms, calling MD promptly, preventive modes, and value of vaccinations. Review of effective airway clearance, coughing and/or vibration techniques. Emphasizing that all should Create an Action Plan. Written material given at graduation. Flowsheet Row Pulmonary Rehab from 04/12/2022 in [facility] Cardiac and Pulmonary Rehab  Education need identified 04/12/22       AED/CPR: - Group verbal and written instruction with the use of models to demonstrate the basic use of the AED with the basic ABC's of resuscitation.    Anatomy and Cardiac Procedures: - Group verbal and visual presentation and models provide information about basic cardiac anatomy and  function. Reviews the testing methods done to diagnose heart disease and the outcomes of the test results. Describes the treatment choices: Medical Management, Angioplasty, or Coronary Bypass Surgery for treating various heart conditions including Myocardial Infarction, Angina, Valve Disease, and Cardiac Arrhythmias.  Written material given at graduation.   Medication Safety: - Group verbal and visual instruction to review commonly prescribed medications for heart and lung disease. Reviews the medication, class of the drug, and side effects. Includes the steps to properly store meds and maintain the prescription regimen.  Written material given at graduation.   Other: -Provides group and verbal instruction on various topics (see comments)   Knowledge Questionnaire Score:  Knowledge Questionnaire Score - 04/12/22 1257       Knowledge Questionnaire Score   Pre Score 11/18              Core Components/Risk Factors/Patient Goals at Admission:  Personal Goals and Risk Factors at Admission - 04/12/22 1257       Core Components/Risk Factors/Patient Goals on Admission    Weight Management Yes;Weight Loss;Obesity    Intervention Weight Management: Develop a combined nutrition and exercise program designed to reach desired caloric intake, while maintaining appropriate intake of nutrient and fiber, sodium and fats, and appropriate  energy expenditure required for the weight goal.;Weight Management: Provide education and appropriate resources to help participant work on and attain dietary goals.;Weight Management/Obesity: Establish reasonable short term and long term weight goals.;Obesity: Provide education and appropriate resources to help participant work on and attain dietary goals.    Admit Weight 219 lb 11.2 oz (99.7 kg)    Goal Weight: Short Term 214 lb (97.1 kg)    Goal Weight: Long Term 200 lb (90.7 kg)    Expected Outcomes Short Term: Continue to assess and modify interventions until  short term weight is achieved;Long Term: Adherence to nutrition and physical activity/exercise program aimed toward attainment of established weight goal;Weight Loss: Understanding of general recommendations for a balanced deficit meal plan, which promotes 1-2 lb weight loss per week and includes a negative energy balance of [phone] kcal/d;Understanding recommendations for meals to include 15-35% energy as protein, 25-35% energy from fat, 35-60% energy from carbohydrates, less than '200mg'$  of dietary cholesterol, 20-35 gm of total fiber daily;Understanding of distribution of calorie intake throughout the day with the consumption of 4-5 meals/snacks    Improve shortness of breath with ADL's Yes    Intervention Provide education, individualized exercise plan and daily activity instruction to help decrease symptoms of SOB with activities of daily living.    Expected Outcomes Short Term: Improve cardiorespiratory fitness to achieve a reduction of symptoms when performing ADLs;Long Term: Be able to perform more ADLs without symptoms or delay the onset of symptoms    Increase knowledge of respiratory medications and ability to use respiratory devices properly  Yes    Intervention Provide education and demonstration as needed of appropriate use of medications, inhalers, and oxygen therapy.    Expected Outcomes Short Term: Achieves understanding of medications use. Understands that oxygen is a medication prescribed by physician. Demonstrates appropriate use of inhaler and oxygen therapy.;Long Term: Maintain appropriate use of medications, inhalers, and oxygen therapy.    Diabetes Yes    Intervention Provide education about signs/symptoms and action to take for hypo/hyperglycemia.;Provide education about proper nutrition, including hydration, and aerobic/resistive exercise prescription along with prescribed medications to achieve blood glucose in normal ranges: Fasting glucose 65-99 mg/dL    Expected Outcomes Short  Term: Participant verbalizes understanding of the signs/symptoms and immediate care of hyper/hypoglycemia, proper foot care and importance of medication, aerobic/resistive exercise and nutrition plan for blood glucose control.;Long Term: Attainment of HbA1C < 7%.    Hypertension Yes    Intervention Provide education on lifestyle modifcations including regular physical activity/exercise, weight management, moderate sodium restriction and increased consumption of fresh fruit, vegetables, and low fat dairy, alcohol moderation, and smoking cessation.;Monitor prescription use compliance.    Expected Outcomes Short Term: Continued assessment and intervention until BP is < 140/74m HG in hypertensive participants. < 130/898mHG in hypertensive participants with diabetes, heart failure or chronic kidney disease.;Long Term: Maintenance of blood pressure at goal levels.    Intervention Provide education and support for participant on nutrition & aerobic/resistive exercise along with prescribed medications to achieve LDL '70mg'$ , HDL >'40mg'$ .    Expected Outcomes Short Term: Participant states understanding of desired cholesterol values and is compliant with medications prescribed. Participant is following exercise prescription and nutrition guidelines.;Long Term: Cholesterol controlled with medications as prescribed, with individualized exercise RX and with personalized nutrition plan. Value goals: LDL < '70mg'$ , HDL > 40 mg.             Education:Diabetes - Individual verbal and written instruction to review signs/symptoms of diabetes, desired  ranges of glucose level fasting, after meals and with exercise. Acknowledge that pre and post exercise glucose checks will be done for 3 sessions at entry of program. Flowsheet Row Pulmonary Rehab from 04/12/2022 in [facility] Cardiac and Pulmonary Rehab  Date 04/10/22  Educator [facility]  Instruction Review Code 1- Verbalizes Understanding       Know Your Numbers and Heart  Failure: - Group verbal and visual instruction to discuss disease risk factors for cardiac and pulmonary disease and treatment options.  Reviews associated critical values for Overweight/Obesity, Hypertension, Cholesterol, and Diabetes.  Discusses basics of heart failure: signs/symptoms and treatments.  Introduces Heart Failure Zone chart for action plan for heart failure.  Written material given at graduation.   Core Components/Risk Factors/Patient Goals Review:    Core Components/Risk Factors/Patient Goals at Discharge (Final Review):    ITP Comments:  ITP Comments     Row Name 04/10/22 0937 04/12/22 1141         ITP Comments Virtual Visit completed. Patient informed on EP and RD appointment and 6 Minute walk test. Patient also informed of patient health questionnaires on My Chart. Patient Verbalizes understanding. Visit diagnosis can be found in [facility] 02/01/2022. Completed 6MWT and gym orientation. Initial ITP created and sent for review to Dr. Zetta Bills, Medical Director.               Comments: Initial ITP

## 2022-04-25 ENCOUNTER — Encounter: Payer: Medicare Other | Attending: Pulmonary Disease | Admitting: *Deleted

## 2022-04-25 DIAGNOSIS — D869 Sarcoidosis, unspecified: Secondary | ICD-10-CM | POA: Insufficient documentation

## 2022-04-25 DIAGNOSIS — J449 Chronic obstructive pulmonary disease, unspecified: Secondary | ICD-10-CM | POA: Insufficient documentation

## 2022-04-25 DIAGNOSIS — R0602 Shortness of breath: Secondary | ICD-10-CM | POA: Diagnosis present

## 2022-04-25 LAB — GLUCOSE, CAPILLARY
Glucose-Capillary: 163 mg/dL — ABNORMAL HIGH (ref 70–99)
Glucose-Capillary: 162 mg/dL — ABNORMAL HIGH (ref 70–99)

## 2022-04-25 NOTE — Progress Notes (Signed)
Daily Session Note  Patient Details  Name: Ethan Frye MRN: [id number] Date of Birth: Mar 29, 1950 Referring Provider:   Flowsheet Row Pulmonary Rehab from 04/12/2022 in [facility] Cardiac and Pulmonary Rehab  Referring Provider Eulis Manly MD       Encounter Date: 04/25/2022  Check In:  Session Check In - 04/25/22 0839       Check-In   Supervising physician immediately available to respond to emergencies See telemetry face sheet for immediately available ER MD    Location [facility]-Cardiac & Pulmonary Rehab    Staff Present Heath Lark, RN, BSN, CCRP;Jessica [place]o, MA, RCEP, CCRP, Bertram Gala, MS, ACSM CEP, Exercise Physiologist    Virtual Visit No    Medication changes reported     No    Fall or balance concerns reported    No    Warm-up and Cool-down Performed on first and last piece of equipment    Resistance Training Performed Yes    VAD Patient? No    PAD/SET Patient? No      Pain Assessment   Currently in Pain? No/denies                Social History   Tobacco Use  Smoking Status Former   Packs/day: 0.25   Years: 8.00   Total pack years: 2.00   Types: Cigarettes   Quit date: 03/16/1977   Years since quitting: 45.1   Passive exposure: Past  Smokeless Tobacco Former    Goals Met:  Proper associated with RPD/PD & O2 Sat Exercise tolerated well Personal goals reviewed No report of concerns or symptoms today  Goals Unmet:  Not Applicable  Comments: First full day of exercise!  Patient was oriented to gym and equipment including functions, settings, policies, and procedures.  Patient's individual exercise prescription and treatment plan were reviewed.  All starting workloads were established based on the results of the 6 minute walk test done at initial orientation visit.  The plan for exercise progression was also introduced and progression will be customized based on patient's performance and goals.    Dr. Emily Filbert is Medical Director  for [place].  Dr. Ottie Glazier is Medical Director for [facility] Pulmonary Rehabilitation.

## 2022-04-27 ENCOUNTER — Encounter: Payer: Medicare Other | Admitting: *Deleted

## 2022-04-27 DIAGNOSIS — D869 Sarcoidosis, unspecified: Secondary | ICD-10-CM | POA: Diagnosis not present

## 2022-04-27 LAB — GLUCOSE, CAPILLARY
Glucose-Capillary: 189 mg/dL — ABNORMAL HIGH (ref 70–99)
Glucose-Capillary: 152 mg/dL — ABNORMAL HIGH (ref 70–99)

## 2022-04-27 NOTE — Progress Notes (Signed)
Daily Session Note  Patient Details  Name: Ethan Frye MRN: [id number] Date of Birth: 01/01/1951 Referring Provider:   Flowsheet Row Pulmonary Rehab from 04/12/2022 in [facility] Cardiac and Pulmonary Rehab  Referring Provider Eulis Manly MD       Encounter Date: 04/27/2022  Check In:  Session Check In - 04/27/22 0748       Check-In   Supervising physician immediately available to respond to emergencies See telemetry face sheet for immediately available ER MD    Location [facility]-Cardiac & Pulmonary Rehab    Staff Present Darlyne Russian, RN, ADN;Jessica Luan [place], MA, RCEP, CCRP, Bertram Gala, MS, ACSM CEP, Exercise Physiologist;Joseph Tessie Fass, [place]    Virtual Visit No    Medication changes reported     No    Fall or balance concerns reported    No    Warm-up and Cool-down Performed on first and last piece of equipment    Resistance Training Performed Yes    VAD Patient? No    PAD/SET Patient? No      Pain Assessment   Currently in Pain? No/denies                Social History   Tobacco Use  Smoking Status Former   Packs/day: 0.25   Years: 8.00   Additional pack years: 0.00   Total pack years: 2.00   Types: Cigarettes   Quit date: 03/16/1977   Years since quitting: 45.1   Passive exposure: Past  Smokeless Tobacco Former    Goals Met:  Independence with exercise equipment Exercise tolerated well No report of concerns or symptoms today Strength training completed today  Goals Unmet:  Not Applicable  Comments: Pt able to follow exercise prescription today without complaint.  Will continue to monitor for progression.    Dr. Emily Filbert is Medical Director for [place].  Dr. Ottie Glazier is Medical Director for [facility] Pulmonary Rehabilitation.

## 2022-05-02 ENCOUNTER — Encounter: Payer: Medicare Other | Admitting: *Deleted

## 2022-05-02 DIAGNOSIS — D869 Sarcoidosis, unspecified: Secondary | ICD-10-CM

## 2022-05-02 LAB — GLUCOSE, CAPILLARY
Glucose-Capillary: 171 mg/dL — ABNORMAL HIGH (ref 70–99)
Glucose-Capillary: 155 mg/dL — ABNORMAL HIGH (ref 70–99)

## 2022-05-02 NOTE — Progress Notes (Signed)
Daily Session Note  Patient Details  Name: DAMIANO RINEER MRN: [id number] Date of Birth: 06-May-1950 Referring Provider:   Flowsheet Row Pulmonary Rehab from 04/12/2022 in [facility] Cardiac and Pulmonary Rehab  Referring Provider Eulis Manly MD       Encounter Date: 05/02/2022  Check In:  Session Check In - 05/02/22 1033       Check-In   Supervising physician immediately available to respond to emergencies See telemetry face sheet for immediately available ER MD    Location [facility]-Cardiac & Pulmonary Rehab    Staff Present Heath Lark, RN, BSN, CCRP;Jessica [place], MA, RCEP, CCRP, Bertram Gala, MS, ACSM CEP, Exercise Physiologist    Virtual Visit No    Medication changes reported     No    Fall or balance concerns reported    No    Warm-up and Cool-down Performed on first and last piece of equipment    Resistance Training Performed Yes    VAD Patient? No    PAD/SET Patient? No      Pain Assessment   Currently in Pain? No/denies                Social History   Tobacco Use  Smoking Status Former   Packs/day: 0.25   Years: 8.00   Additional pack years: 0.00   Total pack years: 2.00   Types: Cigarettes   Quit date: 03/16/1977   Years since quitting: 45.1   Passive exposure: Past  Smokeless Tobacco Former    Goals Met:  Proper associated with RPD/PD & O2 Sat Independence with exercise equipment Exercise tolerated well No report of concerns or symptoms today  Goals Unmet:  Not Applicable  Comments: Pt able to follow exercise prescription today without complaint.  Will continue to monitor for progression.    Dr. Emily Filbert is Medical Director for [place].  Dr. Ottie Glazier is Medical Director for [facility] Pulmonary Rehabilitation.

## 2022-05-03 ENCOUNTER — Encounter: Payer: Self-pay | Admitting: *Deleted

## 2022-05-03 DIAGNOSIS — D869 Sarcoidosis, unspecified: Secondary | ICD-10-CM

## 2022-05-03 NOTE — Progress Notes (Signed)
Pulmonary Individual Treatment Plan  Patient Details  Name: Ethan Frye MRN: [id number] Date of Birth: February 16, 1950 Referring Provider:   Flowsheet Row Pulmonary Rehab from 04/12/2022 in [facility] Cardiac and Pulmonary Rehab  Referring Provider Eulis Manly MD       Initial Encounter Date:  Flowsheet Row Pulmonary Rehab from 04/12/2022 in [facility] Cardiac and Pulmonary Rehab  Date 04/12/22       Visit Diagnosis: Sarcoidosis  Patient's Home Medications on Admission:  Current Outpatient Medications:    acidophilus (RISAQUAD) CAPS capsule, Take 1 capsule by mouth daily., Disp: , Rfl:    albuterol (PROVENTIL) (2.5 MG/3ML) 0.083% nebulizer solution, Take 2.5 mg by nebulization every 6 (six) hours as needed for wheezing or shortness of breath. (Patient not taking: Reported on 04/10/2022), Disp: , Rfl:    albuterol (VENTOLIN HFA) 108 (90 Base) MCG/ACT inhaler, Inhale 2 puffs into the lungs every 6 (six) hours as needed for wheezing or shortness of breath., Disp: , Rfl:    aspirin EC 81 MG tablet, Take 81 mg by mouth daily. Swallow whole., Disp: , Rfl:    aspirin EC 81 MG tablet, Take by mouth. (Patient not taking: Reported on 04/10/2022), Disp: , Rfl:    atorvastatin (LIPITOR) 20 MG tablet, Take 20 mg by mouth daily. (Patient not taking: Reported on 04/10/2022), Disp: , Rfl:    atorvastatin (LIPITOR) 40 MG tablet, Take by mouth., Disp: , Rfl:    azithromycin (ZITHROMAX) 250 MG tablet, Take 1 tablet by mouth every Monday, Wednesday and Friday, Disp: , Rfl:    azithromycin (ZITHROMAX) 500 MG tablet, Take 500 mg by mouth every Monday, Wednesday, and Friday. (Patient not taking: Reported on 04/10/2022), Disp: , Rfl:    Blood Glucose Monitoring Suppl (FIFTY50 GLUCOSE METER 2.0) w/Device KIT, See admin instructions., Disp: , Rfl:    budesonide (PULMICORT) 0.5 MG/2ML nebulizer solution, Inhale into the lungs., Disp: , Rfl:    carvedilol (COREG) 25 MG tablet, Take 25 mg by mouth 2 (two) times daily.  (Patient not taking: Reported on 04/10/2022), Disp: , Rfl:    carvedilol (COREG) 25 MG tablet, Take 1 tablet by mouth 2 (two) times daily with a meal., Disp: , Rfl:    celecoxib (CELEBREX) 200 MG capsule, Take 200 mg by mouth daily as needed for pain. (Patient not taking: Reported on 04/10/2022), Disp: , Rfl:    celecoxib (CELEBREX) 200 MG capsule, Take by mouth., Disp: , Rfl:    cetirizine (ZYRTEC) 10 MG tablet, Take 10 mg by mouth daily., Disp: , Rfl:    cholecalciferol (VITAMIN D) 25 MCG (1000 UNIT) tablet, Take 1,000 Units by mouth daily., Disp: , Rfl:    clonazePAM (KLONOPIN) 1 MG tablet, Take 1 mg by mouth 2 (two) times daily. (Patient not taking: Reported on 04/10/2022), Disp: , Rfl:    clopidogrel (PLAVIX) 75 MG tablet, clopidogrel 75 mg tablet  TAKE 1 TABLET (75 MG TOTAL) BY MOUTH ONCE DAILY., Disp: , Rfl:    Cysteamine Bitartrate (PROCYSBI) 300 MG PACK, Use 1 each 3 (three) times daily Use as instructed., Disp: , Rfl:    diclofenac Sodium (VOLTAREN) 1 % GEL, Apply 2 g topically 4 (four) times daily. (Patient not taking: Reported on 04/10/2022), Disp: , Rfl:    diclofenac Sodium (VOLTAREN) 1 % GEL, Apply 2 g topically 4 (four) times daily., Disp: , Rfl:    doxepin (SINEQUAN) 50 MG capsule, Take 150 mg by mouth at bedtime. (Patient not taking: Reported on 04/10/2022), Disp: , Rfl:  doxepin (SINEQUAN) 50 MG capsule, Take by mouth., Disp: , Rfl:    DULoxetine (CYMBALTA) 60 MG capsule, Take 60 mg by mouth daily. (Patient not taking: Reported on 04/10/2022), Disp: , Rfl:    DULoxetine (CYMBALTA) 60 MG capsule, Take 1 tablet by mouth daily., Disp: , Rfl:    ferrous sulfate 325 (65 FE) MG tablet, Take 325 mg by mouth daily with breakfast., Disp: , Rfl:    fluticasone-salmeterol (ADVAIR) 500-50 MCG/ACT AEPB, Inhale 1 puff into the lungs every 12 (twelve) hours., Disp: , Rfl:    glucose blood (PRECISION QID TEST) test strip, Use 1 each (1 strip total) once daily Use as instructed., Disp: , Rfl:     HYDROcodone-acetaminophen (NORCO/VICODIN) 5-325 MG tablet, Take 1 tablet by mouth every 4 (four) hours as needed for pain., Disp: , Rfl:    ketoconazole (NIZORAL) 2 % cream, ketoconazole 2 % topical cream  APPLY TWICE DAILY TO RASH ON FACE AND GROIN, Disp: , Rfl:    lisinopril (ZESTRIL) 40 MG tablet, Take 40 mg by mouth daily., Disp: , Rfl:    losartan-hydrochlorothiazide (HYZAAR) 100-25 MG tablet, Take 1 tablet by mouth daily., Disp: , Rfl:    metFORMIN (GLUCOPHAGE) 500 MG tablet, Take 500-1,000 mg by mouth 2 (two) times daily with a meal. (Patient not taking: Reported on 04/10/2022), Disp: , Rfl:    metFORMIN (GLUCOPHAGE) 500 MG tablet, Take by mouth., Disp: , Rfl:    methadone (DOLOPHINE) 5 MG tablet, Take 2.5-5 mg by mouth 2 (two) times daily. Take 2.5 mg (one-half tablet) twice daily for one week then increase to 5 mg (one tablet) twice daily, thereafter, Disp: , Rfl:    montelukast (SINGULAIR) 10 MG tablet, Take 10 mg by mouth daily. (Patient not taking: Reported on 04/10/2022), Disp: , Rfl:    montelukast (SINGULAIR) 10 MG tablet, Take 1 tablet by mouth daily., Disp: , Rfl:    moxifloxacin (VIGAMOX) 0.5 % ophthalmic solution, moxifloxacin 0.5 % eye drops  PUT 1 DROP IN RIGHT EYE 4 TIMES A DAY FOR 7 DAYS OR AS DIRECTED, Disp: , Rfl:    Multiple Vitamins-Minerals (MULTIVITAMIN WITH MINERALS) tablet, Take 1 tablet by mouth daily., Disp: , Rfl:    naloxone (NARCAN) nasal spray 4 mg/0.1 mL, Place into the nose., Disp: , Rfl:    nitroGLYCERIN (NITROSTAT) 0.4 MG SL tablet, nitroglycerin 0.4 mg sublingual tablet  PLACE 1 TABLET UNDER THE TONGUE EVERY 5MIS FOR CHEST PAIN, MAY TAKE UP TO 3 DOSES, Disp: , Rfl:    nystatin (MYCOSTATIN) 100000 UNIT/ML suspension, Take 2 mLs by mouth in the morning, at noon, in the evening, and at bedtime. (Patient not taking: Reported on 04/10/2022), Disp: , Rfl:    nystatin (MYCOSTATIN) 100000 UNIT/ML suspension, nystatin 100,000 unit/mL oral suspension  SWISH AND SWALLOW 5  MLS 4 (FOUR) TIMES DAILY., Disp: , Rfl:    omeprazole (PRILOSEC) 40 MG capsule, Take 40 mg by mouth daily. (Patient not taking: Reported on 04/10/2022), Disp: , Rfl:    omeprazole (PRILOSEC) 40 MG capsule, Take 1 tablet by mouth daily., Disp: , Rfl:    oxyCODONE (OXY IR/ROXICODONE) 5 MG immediate release tablet, Take by mouth., Disp: , Rfl:    pimecrolimus (ELIDEL) 1 % cream, Apply 1 application topically 2 (two) times daily., Disp: , Rfl:    pimecrolimus (ELIDEL) 1 % cream, Elidel 1 % topical cream  APPLY TWICE DAILY TO AFFECTED AREA(S) FOR 1-2 WEEKS (Patient not taking: Reported on 04/10/2022), Disp: , Rfl:  polyethylene glycol (MIRALAX / GLYCOLAX) 17 g packet, Take 17 g by mouth 2 (two) times daily. (Patient not taking: Reported on 04/10/2022), Disp: , Rfl:    polyethylene glycol powder (GLYCOLAX/MIRALAX) 17 GM/SCOOP powder, Take by mouth., Disp: , Rfl:    pregabalin (LYRICA) 75 MG capsule, Take 75 mg by mouth 3 (three) times daily., Disp: , Rfl:    Semaglutide,0.25 or 0.5MG /DOS, (OZEMPIC, 0.25 OR 0.5 MG/DOSE,) 2 MG/3ML SOPN, Inject into the skin., Disp: , Rfl:    senna-docusate (SENOKOT-S) 8.6-50 MG tablet, Take 1 tablet by mouth daily. (Patient not taking: Reported on 04/10/2022), Disp: , Rfl:    senna-docusate (SENOKOT-S) 8.6-50 MG tablet, Take 2 tablets by mouth daily., Disp: , Rfl:    spironolactone (ALDACTONE) 25 MG tablet, Take 12.5 mg by mouth daily., Disp: , Rfl:    SUMAtriptan (IMITREX) 50 MG tablet, Take 50 mg by mouth as directed., Disp: , Rfl:    tiZANidine (ZANAFLEX) 4 MG tablet, Take 4 mg by mouth 3 (three) times daily., Disp: , Rfl:    traMADol (ULTRAM) 50 MG tablet, Take 50 mg by mouth every 6 (six) hours as needed for pain., Disp: , Rfl:    vitamin B-12 (CYANOCOBALAMIN) 1000 MCG tablet, Take 1,000 mcg by mouth daily., Disp: , Rfl:   Past Medical History: No past medical history on file.  Tobacco Use: Social History   Tobacco Use  Smoking Status Former   Packs/day: 0.25    Years: 8.00   Additional pack years: 0.00   Total pack years: 2.00   Types: Cigarettes   Quit date: 03/16/1977   Years since quitting: 45.1   Passive exposure: Past  Smokeless Tobacco Former    Labs: Insurance account manager       Latest Ref Rng & Units 11/04/2020 11/05/2020  Labs for ITP Cardiac and Pulmonary Rehab  Hemoglobin A1c 4.8 - 5.6 % - 6.2   PH, Arterial 7.350 - 7.450 7.24  7.34   PCO2 arterial 32.0 - 48.0 mmHg 44  37   Bicarbonate 20.0 - 28.0 mmol/L 20.2  18.9  20.0   Acid-base deficit 0.0 - 2.0 mmol/L 6.9  8.2  5.2   O2 Saturation % 75.8  89.5  99.2      Pulmonary Assessment Scores:  Pulmonary Assessment Scores     Row Name 04/12/22 1259         ADL UCSD   ADL Phase Entry     SOB Score total 91     Rest 3     Walk 4     Stairs 5     Bath 2     Dress 3     Shop 4       CAT Score   CAT Score 32       mMRC Score   mMRC Score 4              UCSD: Self-administered rating of dyspnea associated with activities of daily living (ADLs) 6-point scale (0 = "not at all" to 5 = "maximal or unable to do because of breathlessness")  Scoring Scores range from 0 to 120.  Minimally important difference is 5 units  CAT: CAT can identify the health impairment of COPD patients and is better correlated with disease progression.  CAT has a scoring range of zero to 40. The CAT score is classified into four groups of low (less than 10), medium (10 - 20), high (21-30) and very high (31-40) based on the impact level  of disease on health status. A CAT score over 10 suggests significant symptoms.  A worsening CAT score could be explained by an exacerbation, poor medication adherence, poor inhaler technique, or progression of COPD or comorbid conditions.  CAT MCID is 2 points  mMRC: mMRC (Modified Medical Research Council) Dyspnea Scale is used to assess the degree of baseline functional disability in patients of respiratory disease due to dyspnea. No minimal important difference  is established. A decrease in score of 1 point or greater is considered a positive change.   Pulmonary Function Assessment:  Pulmonary Function Assessment - 04/12/22 1259       Breath   Shortness of Breath Yes;Limiting activity;Panic with Shortness of Breath             Exercise Target Goals: Exercise Program Goal: Individual exercise prescription set using results from initial 6 min walk test and THRR while considering  patient's activity barriers and safety.   Exercise Prescription Goal: Initial exercise prescription builds to 30-45 minutes a day of aerobic activity, 2-3 days per week.  Home exercise guidelines will be given to patient during program as part of exercise prescription that the participant will acknowledge.  Education: Aerobic Exercise: - Group verbal and visual presentation on the components of exercise prescription. Introduces F.I.T.T principle from ACSM for exercise prescriptions.  Reviews F.I.T.T. principles of aerobic exercise including progression. Written material given at graduation. Flowsheet Row Pulmonary Rehab from 04/27/2022 in [facility] Cardiac and Pulmonary Rehab  Education need identified 04/12/22       Education: Resistance Exercise: - Group verbal and visual presentation on the components of exercise prescription. Introduces F.I.T.T principle from ACSM for exercise prescriptions  Reviews F.I.T.T. principles of resistance exercise including progression. Written material given at graduation.    Education: Exercise & Equipment Safety: - Individual verbal instruction and demonstration of equipment use and safety with use of the equipment. Flowsheet Row Pulmonary Rehab from 04/27/2022 in [facility] Cardiac and Pulmonary Rehab  Date 04/10/22  Educator [facility]  Instruction Review Code 1- Verbalizes Understanding       Education: Exercise Physiology & General Exercise Guidelines: - Group verbal and written instruction with models to review the exercise physiology  of the cardiovascular system and associated critical values. Provides general exercise guidelines with specific guidelines to those with heart or lung disease.    Education: Flexibility, Balance, Mind/Body Relaxation: - Group verbal and visual presentation with interactive activity on the components of exercise prescription. Introduces F.I.T.T principle from ACSM for exercise prescriptions. Reviews F.I.T.T. principles of flexibility and balance exercise training including progression. Also discusses the mind body connection.  Reviews various relaxation techniques to help reduce and manage stress (i.e. Deep breathing, progressive muscle relaxation, and visualization). Balance handout provided to take home. Written material given at graduation.   Activity Barriers & Risk Stratification:  Activity Barriers & Cardiac Risk Stratification - 04/12/22 1154       Activity Barriers & Cardiac Risk Stratification   Activity Barriers Arthritis;Fibromyalgia;Joint Problems;Shortness of Breath;Muscular Weakness;Deconditioning;Balance Concerns;History of Falls;Assistive Device   chronic pain from fibromyalgia and RA in hands/shoulders/knees, fell at home on Sunday bruised left side            6 Minute Walk:  6 Minute Walk     Row Name 04/12/22 1141         6 Minute Walk   Phase Initial     Distance 840 feet     Walk Time 6 minutes     # of Rest  Breaks 0     MPH 1.59     METS 1.86     RPE 17     Perceived Dyspnea  3     VO2 Peak 6.51     Symptoms Yes (comment)     Comments SOB, chronic hip pain     Resting HR 78 bpm     Resting BP 118/68     Resting Oxygen Saturation  94 %     Exercise Oxygen Saturation  during 6 min walk 89 %     Max Ex. HR 92 bpm     Max Ex. BP 146/74     2 Minute Post BP 136/72       Interval HR   1 Minute HR 86     2 Minute HR 91     3 Minute HR 90     4 Minute HR 91     5 Minute HR 92     6 Minute HR 92     2 Minute Post HR 78     Interval Heart Rate? Yes        Interval Oxygen   Interval Oxygen? Yes     Baseline Oxygen Saturation % 94 %     1 Minute Oxygen Saturation % 90 %     1 Minute Liters of Oxygen 0 L  Room Air     2 Minute Oxygen Saturation % 90 %     2 Minute Liters of Oxygen 0 L     3 Minute Oxygen Saturation % 89 %     3 Minute Liters of Oxygen 0 L     4 Minute Oxygen Saturation % 89 %     4 Minute Liters of Oxygen 0 L     5 Minute Oxygen Saturation % 90 %     5 Minute Liters of Oxygen 0 L     6 Minute Oxygen Saturation % 90 %     6 Minute Liters of Oxygen 0 L     2 Minute Post Oxygen Saturation % 94 %     2 Minute Post Liters of Oxygen 0 L             Oxygen Initial Assessment:  Oxygen Initial Assessment - 04/10/22 0952       Home Oxygen   Home Oxygen Device Home Concentrator    Sleep Oxygen Prescription Continuous;CPAP   does not wear   Liters per minute 0    Home Exercise Oxygen Prescription Continuous    Liters per minute 2   prn   Home Resting Oxygen Prescription Continuous    Liters per minute 2   prn   Compliance with Home Oxygen Use No      Initial 6 min Walk   Oxygen Used None      Program Oxygen Prescription   Program Oxygen Prescription None      Intervention   Short Term Goals To learn and exhibit compliance with exercise, home and travel O2 prescription;To learn and understand importance of maintaining oxygen saturations>88%;To learn and demonstrate proper use of respiratory medications;To learn and demonstrate proper pursed lip breathing techniques or other breathing techniques. ;To learn and understand importance of monitoring SPO2 with pulse oximeter and demonstrate accurate use of the pulse oximeter.    Long  Term Goals Verbalizes importance of monitoring SPO2 with pulse oximeter and return demonstration;Exhibits proper breathing techniques, such as pursed lip breathing or other method taught during program session;Demonstrates proper  use of MDI's;Compliance with respiratory  medication;Maintenance of O2 saturations>88%;Exhibits compliance with exercise, home  and travel O2 prescription             Oxygen Re-Evaluation:  Oxygen Re-Evaluation     Row Name 04/25/22 0840             Program Oxygen Prescription   Program Oxygen Prescription None         Home Oxygen   Home Oxygen Device Home Concentrator       Sleep Oxygen Prescription Continuous;CPAP       Liters per minute 0       Liters per minute 2       Home Resting Oxygen Prescription Continuous       Liters per minute 2         Goals/Expected Outcomes   Short Term Goals To learn and demonstrate proper pursed lip breathing techniques or other breathing techniques.        Comments Reviewed PLB technique with pt.  Talked about how it works and it's importance in maintaining their exercise saturations.       Goals/Expected Outcomes Short: Become more profiecient at using PLB. Long: Become independent at using PLB.                Oxygen Discharge (Final Oxygen Re-Evaluation):  Oxygen Re-Evaluation - 04/25/22 0840       Program Oxygen Prescription   Program Oxygen Prescription None      Home Oxygen   Home Oxygen Device Home Concentrator    Sleep Oxygen Prescription Continuous;CPAP    Liters per minute 0    Liters per minute 2    Home Resting Oxygen Prescription Continuous    Liters per minute 2      Goals/Expected Outcomes   Short Term Goals To learn and demonstrate proper pursed lip breathing techniques or other breathing techniques.     Comments Reviewed PLB technique with pt.  Talked about how it works and it's importance in maintaining their exercise saturations.    Goals/Expected Outcomes Short: Become more profiecient at using PLB. Long: Become independent at using PLB.             Initial Exercise Prescription:  Initial Exercise Prescription - 04/12/22 1100       Date of Initial Exercise RX and Referring Provider   Date 04/12/22    Referring Provider Eulis Manly MD      Oxygen   Oxygen --   may need oxygen is desaturates with exercise   Maintain Oxygen Saturation 88% or higher      Recumbant Bike   Level 1    RPM 50    Watts 10    Minutes 15    METs 1.8      T5 Nustep   Level 1    SPM 80    Minutes 15    METs 1.8      Biostep-RELP   Level 1    SPM 50    Minutes 15    METs 2      Track   Laps 21    Minutes 15    METs 2.14      Prescription Details   Frequency (times per week) 2    Duration Progress to 30 minutes of continuous aerobic without signs/symptoms of physical distress      Intensity   THRR 40-80% of Max Heartrate 106-135    Ratings of Perceived Exertion 11-13  Perceived Dyspnea 0-4      Progression   Progression Continue to progress workloads to maintain intensity without signs/symptoms of physical distress.      Resistance Training   Training Prescription Yes    Weight 3 lb    Reps 10-15             Perform Capillary Blood Glucose checks as needed.  Exercise Prescription Changes:   Exercise Prescription Changes     Row Name 04/12/22 1100             Response to Exercise   Blood Pressure (Admit) 118/68       Blood Pressure (Exercise) 146/74       Blood Pressure (Exit) 142/60       Heart Rate (Admit) 78 bpm       Heart Rate (Exercise) 92 bpm       Heart Rate (Exit) 74 bpm       Oxygen Saturation (Admit) 94 %       Oxygen Saturation (Exercise) 89 %       Oxygen Saturation (Exit) 94 %       Rating of Perceived Exertion (Exercise) 17       Perceived Dyspnea (Exercise) 3       Symptoms SOB, hip pain       Comments walk test results                Exercise Comments:   Exercise Goals and Review:   Exercise Goals     Row Name 04/12/22 1255             Exercise Goals   Increase Physical Activity Yes       Intervention Provide advice, education, support and counseling about physical activity/exercise needs.;Develop an individualized exercise prescription for aerobic  and resistive training based on initial evaluation findings, risk stratification, comorbidities and participant's personal goals.       Expected Outcomes Short Term: Attend rehab on a regular basis to increase amount of physical activity.;Long Term: Add in home exercise to make exercise part of routine and to increase amount of physical activity.;Long Term: Exercising regularly at least 3-5 days a week.       Increase Strength and Stamina Yes       Intervention Provide advice, education, support and counseling about physical activity/exercise needs.;Develop an individualized exercise prescription for aerobic and resistive training based on initial evaluation findings, risk stratification, comorbidities and participant's personal goals.       Expected Outcomes Short Term: Increase workloads from initial exercise prescription for resistance, speed, and METs.;Short Term: Perform resistance training exercises routinely during rehab and add in resistance training at home;Long Term: Improve cardiorespiratory fitness, muscular endurance and strength as measured by increased METs and functional capacity (6MWT)       Able to understand and use rate of perceived exertion (RPE) scale Yes       Intervention Provide education and explanation on how to use RPE scale       Expected Outcomes Short Term: Able to use RPE daily in rehab to express subjective intensity level;Long Term:  Able to use RPE to guide intensity level when exercising independently       Able to understand and use Dyspnea scale Yes       Intervention Provide education and explanation on how to use Dyspnea scale       Expected Outcomes Short Term: Able to use Dyspnea scale daily in rehab to express subjective sense  of shortness of breath during exertion;Long Term: Able to use Dyspnea scale to guide intensity level when exercising independently       Knowledge and understanding of Target Heart Rate Range (THRR) Yes       Intervention Provide education  and explanation of THRR including how the numbers were predicted and where they are located for reference       Expected Outcomes Short Term: Able to state/look up THRR;Short Term: Able to use daily as guideline for intensity in rehab;Long Term: Able to use THRR to govern intensity when exercising independently       Able to check pulse independently Yes       Intervention Provide education and demonstration on how to check pulse in carotid and radial arteries.;Review the importance of being able to check your own pulse for safety during independent exercise       Expected Outcomes Short Term: Able to explain why pulse checking is important during independent exercise;Long Term: Able to check pulse independently and accurately       Understanding of Exercise Prescription Yes       Intervention Provide education, explanation, and written materials on patient's individual exercise prescription       Expected Outcomes Short Term: Able to explain program exercise prescription;Long Term: Able to explain home exercise prescription to exercise independently                Exercise Goals Re-Evaluation :  Exercise Goals Re-Evaluation     Row Name 04/25/22 [phone]             Exercise Goal Re-Evaluation   Exercise Goals Review Understanding of Exercise Prescription;Knowledge and understanding of Target Heart Rate Range (THRR);Able to understand and use Dyspnea scale;Able to understand and use rate of perceived exertion (RPE) scale       Comments Reviewed RPE scale, THR and program prescription with pt today.  Pt voiced understanding and was given a copy of goals to take home.       Expected Outcomes Short: Use RPE daily to regulate intensity. Long: Follow program prescription in THR.                Discharge Exercise Prescription (Final Exercise Prescription Changes):  Exercise Prescription Changes - 04/12/22 1100       Response to Exercise   Blood Pressure (Admit) 118/68    Blood  Pressure (Exercise) 146/74    Blood Pressure (Exit) 142/60    Heart Rate (Admit) 78 bpm    Heart Rate (Exercise) 92 bpm    Heart Rate (Exit) 74 bpm    Oxygen Saturation (Admit) 94 %    Oxygen Saturation (Exercise) 89 %    Oxygen Saturation (Exit) 94 %    Rating of Perceived Exertion (Exercise) 17    Perceived Dyspnea (Exercise) 3    Symptoms SOB, hip pain    Comments walk test results             Nutrition:  Target Goals: Understanding of nutrition guidelines, daily intake of sodium 1500mg , cholesterol 200mg , calories 30% from fat and 7% or less from saturated fats, daily to have 5 or more servings of fruits and vegetables.  Education: All About Nutrition: -Group instruction provided by verbal, written material, interactive activities, discussions, models, and posters to present general guidelines for heart healthy nutrition including fat, fiber, MyPlate, the role of sodium in heart healthy nutrition, utilization of the nutrition label, and utilization of this knowledge for meal planning. Follow  up email sent as well. Written material given at graduation. Flowsheet Row Pulmonary Rehab from 04/27/2022 in [facility] Cardiac and Pulmonary Rehab  Education need identified 04/12/22       Biometrics:  Pre Biometrics - 04/12/22 1256       Pre Biometrics   Height 5\' 10"  (1.778 m)    Weight 219 lb 11.2 oz (99.7 kg)    Waist Circumference 41 inches    Hip Circumference 40 inches    Waist to Hip Ratio 1.03 %    BMI (Calculated) 31.52    Single Leg Stand 1.4 seconds              Nutrition Therapy Plan and Nutrition Goals:  Nutrition Therapy & Goals - 04/12/22 1256       Intervention Plan   Intervention Prescribe, educate and counsel regarding individualized specific dietary modifications aiming towards targeted core components such as weight, hypertension, lipid management, diabetes, heart failure and other comorbidities.    Expected Outcomes Short Term Goal: Understand basic  principles of dietary content, such as calories, fat, sodium, cholesterol and nutrients.;Short Term Goal: A plan has been developed with personal nutrition goals set during dietitian appointment.;Long Term Goal: Adherence to prescribed nutrition plan.             Nutrition Assessments:  MEDIFICTS Score Key: ?70 Need to make dietary changes  40-70 Heart Healthy Diet ? 40 Therapeutic Level Cholesterol Diet  Flowsheet Row Pulmonary Rehab from 04/12/2022 in [facility] Cardiac and Pulmonary Rehab  Picture Your Plate Total Score on Admission 44      Picture Your Plate Scores: [id number] Unhealthy dietary pattern with much room for improvement. 41-50 Dietary pattern unlikely to meet recommendations for good health and room for improvement. 51-60 More healthful dietary pattern, with some room for improvement.  >60 Healthy dietary pattern, although there may be some specific behaviors that could be improved.   Nutrition Goals Re-Evaluation:   Nutrition Goals Discharge (Final Nutrition Goals Re-Evaluation):   Psychosocial: Target Goals: Acknowledge presence or absence of significant depression and/or stress, maximize coping skills, provide positive support system. Participant is able to verbalize types and ability to use techniques and skills needed for reducing stress and depression.   Education: Stress, Anxiety, and Depression - Group verbal and visual presentation to define topics covered.  Reviews how body is impacted by stress, anxiety, and depression.  Also discusses healthy ways to reduce stress and to treat/manage anxiety and depression.  Written material given at graduation. Flowsheet Row Pulmonary Rehab from 04/27/2022 in [facility] Cardiac and Pulmonary Rehab  Date 04/27/22  Educator [facility]  Instruction Review Code 1- [employer] Understanding       Education: Sleep Hygiene -Provides group verbal and written instruction about how sleep can affect your health.  Define sleep hygiene, discuss sleep  cycles and impact of sleep habits. Review good sleep hygiene tips.    Initial Review & Psychosocial Screening:  Initial Psych Review & Screening - 04/10/22 0956       Initial Review   Current issues with Current Anxiety/Panic;History of Depression;Current Psychotropic Meds;Current Sleep Concerns      Family Dynamics   Good Support System? Yes    Comments Ethan Frye states that his health and pain levels are the source of his depression and anxiety. He states he feels bad all the time. Ethan Frye can look to his wife, son and other family for support.      Barriers   Psychosocial barriers to participate in program The patient  should benefit from training in stress management and relaxation.      Screening Interventions   Interventions Encouraged to exercise;To provide support and resources with identified psychosocial needs;Provide feedback about the scores to participant    Expected Outcomes Short Term goal: Utilizing psychosocial counselor, staff and physician to assist with identification of specific Stressors or current issues interfering with healing process. Setting desired goal for each stressor or current issue identified.;Long Term Goal: Stressors or current issues are controlled or eliminated.;Short Term goal: Identification and review with participant of any Quality of Life or Depression concerns found by scoring the questionnaire.;Long Term goal: The participant improves quality of Life and PHQ9 Scores as seen by post scores and/or verbalization of changes             Quality of Life Scores:  Scores of 19 and below usually indicate a poorer quality of life in these areas.  A difference of  2-3 points is a clinically meaningful difference.  A difference of 2-3 points in the total score of the Quality of Life Index has been associated with significant improvement in overall quality of life, self-image, physical symptoms, and general health in studies assessing change in quality of  life.  PHQ-9: Review Flowsheet       04/12/2022  Depression screen PHQ 2/9  Decreased Interest 2  Down, Depressed, Hopeless 1  PHQ - 2 Score 3  Altered sleeping 2  Tired, decreased energy 2  Change in appetite 2  Feeling bad or failure about yourself  0  Trouble concentrating 0  Moving slowly or fidgety/restless 1  Suicidal thoughts 0  PHQ-9 Score 10  Difficult doing work/chores Somewhat difficult   Interpretation of Total Score  Total Score Depression Severity:  1-4 = Minimal depression, 5-9 = Mild depression, 10-14 = Moderate depression, 15-19 = Moderately severe depression, 20-27 = Severe depression   Psychosocial Evaluation and Intervention:  Psychosocial Evaluation - 04/10/22 0958       Psychosocial Evaluation & Interventions   Interventions Encouraged to exercise with the program and follow exercise prescription;Relaxation education;Stress management education    Comments Ethan Frye states that his health and pain levels are the source of his depression and anxiety. He states he feels bad all the time. Ethan Frye can look to his wife, son and other family for support.    Expected Outcomes Short: Start [facility] to help with mood. Long: Maintain a healthy mental state.    Continue Psychosocial Services  Follow up required by staff             Psychosocial Re-Evaluation:   Psychosocial Discharge (Final Psychosocial Re-Evaluation):   Education: Education Goals: Education classes will be provided on a weekly basis, covering required topics. Participant will state understanding/return demonstration of topics presented.  Learning Barriers/Preferences:  Learning Barriers/Preferences - 04/12/22 1257       Learning Barriers/Preferences   Learning Barriers Hearing    Learning Preferences None             General Pulmonary Education Topics:  Infection Prevention: - Provides verbal and written material to individual with discussion of infection control including  proper hand washing and proper equipment cleaning during exercise session. Flowsheet Row Pulmonary Rehab from 04/27/2022 in [facility] Cardiac and Pulmonary Rehab  Date 04/10/22  Educator [facility]  Instruction Review Code 1- Verbalizes Understanding       Falls Prevention: - Provides verbal and written material to individual with discussion of falls prevention and safety. Flowsheet Row Pulmonary Rehab from  04/27/2022 in [facility] Cardiac and Pulmonary Rehab  Date 04/10/22  Educator [facility]  Instruction Review Code 1- Verbalizes Understanding       Chronic Lung Disease Review: - Group verbal instruction with posters, models, PowerPoint presentations and videos,  to review new updates, new respiratory medications, new advancements in procedures and treatments. Providing information on websites and "800" numbers for continued self-education. Includes information about supplement oxygen, available portable oxygen systems, continuous and intermittent flow rates, oxygen safety, concentrators, and Medicare reimbursement for oxygen. Explanation of Pulmonary Drugs, including class, frequency, complications, importance of spacers, rinsing mouth after steroid MDI's, and proper cleaning methods for nebulizers. Review of basic lung anatomy and physiology related to function, structure, and complications of lung disease. Review of risk factors. Discussion about methods for diagnosing sleep apnea and types of masks and machines for OSA. Includes a review of the use of types of environmental controls: home humidity, furnaces, filters, dust mite/pet prevention, HEPA vacuums. Discussion about weather changes, air quality and the benefits of nasal washing. Instruction on Warning signs, infection symptoms, calling MD promptly, preventive modes, and value of vaccinations. Review of effective airway clearance, coughing and/or vibration techniques. Emphasizing that all should Create an Action Plan. Written material given at  graduation. Flowsheet Row Pulmonary Rehab from 04/27/2022 in [place] Cardiac and Pulmonary Rehab  Education need identified 04/12/22       AED/CPR: - Group verbal and written instruction with the use of models to demonstrate the basic use of the AED with the basic ABC's of resuscitation.    Anatomy and Cardiac Procedures: - Group verbal and visual presentation and models provide information about basic cardiac anatomy and function. Reviews the testing methods done to diagnose heart disease and the outcomes of the test results. Describes the treatment choices: Medical Management, Angioplasty, or Coronary Bypass Surgery for treating various heart conditions including Myocardial Infarction, Angina, Valve Disease, and Cardiac Arrhythmias.  Written material given at graduation.   Medication Safety: - Group verbal and visual instruction to review commonly prescribed medications for heart and lung disease. Reviews the medication, class of the drug, and side effects. Includes the steps to properly store meds and maintain the prescription regimen.  Written material given at graduation.   Other: -Provides group and verbal instruction on various topics (see comments)   Knowledge Questionnaire Score:  Knowledge Questionnaire Score - 04/12/22 1257       Knowledge Questionnaire Score   Pre Score 11/18              Core Components/Risk Factors/Patient Goals at Admission:  Personal Goals and Risk Factors at Admission - 04/12/22 1257       Core Components/Risk Factors/Patient Goals on Admission    Weight Management Yes;Weight Loss;Obesity    Intervention Weight Management: Develop a combined nutrition and exercise program designed to reach desired caloric intake, while maintaining appropriate intake of nutrient and fiber, sodium and fats, and appropriate energy expenditure required for the weight goal.;Weight Management: Provide education and appropriate resources to help participant work on  and attain dietary goals.;Weight Management/Obesity: Establish reasonable short term and long term weight goals.;Obesity: Provide education and appropriate resources to help participant work on and attain dietary goals.    Admit Weight 219 lb 11.2 oz (99.7 kg)    Goal Weight: Short Term 214 lb (97.1 kg)    Goal Weight: Long Term 200 lb (90.7 kg)    Expected Outcomes Short Term: Continue to assess and modify interventions until short term weight is achieved;Long  Term: Adherence to nutrition and physical activity/exercise program aimed toward attainment of established weight goal;Weight Loss: Understanding of general recommendations for a balanced deficit meal plan, which promotes 1-2 lb weight loss per week and includes a negative energy balance of [phone] kcal/d;Understanding recommendations for meals to include 15-35% energy as protein, 25-35% energy from fat, 35-60% energy from carbohydrates, less than 200mg  of dietary cholesterol, 20-35 gm of total fiber daily;Understanding of distribution of calorie intake throughout the day with the consumption of 4-5 meals/snacks    Improve shortness of breath with ADL's Yes    Intervention Provide education, individualized exercise plan and daily activity instruction to help decrease symptoms of SOB with activities of daily living.    Expected Outcomes Short Term: Improve cardiorespiratory fitness to achieve a reduction of symptoms when performing ADLs;Long Term: Be able to perform more ADLs without symptoms or delay the onset of symptoms    Increase knowledge of respiratory medications and ability to use respiratory devices properly  Yes    Intervention Provide education and demonstration as needed of appropriate use of medications, inhalers, and oxygen therapy.    Expected Outcomes Short Term: Achieves understanding of medications use. Understands that oxygen is a medication prescribed by physician. Demonstrates appropriate use of inhaler and oxygen therapy.;Long  Term: Maintain appropriate use of medications, inhalers, and oxygen therapy.    Diabetes Yes    Intervention Provide education about signs/symptoms and action to take for hypo/hyperglycemia.;Provide education about proper nutrition, including hydration, and aerobic/resistive exercise prescription along with prescribed medications to achieve blood glucose in normal ranges: Fasting glucose 65-99 mg/dL    Expected Outcomes Short Term: Participant verbalizes understanding of the signs/symptoms and immediate care of hyper/hypoglycemia, proper foot care and importance of medication, aerobic/resistive exercise and nutrition plan for blood glucose control.;Long Term: Attainment of HbA1C < 7%.    Hypertension Yes    Intervention Provide education on lifestyle modifcations including regular physical activity/exercise, weight management, moderate sodium restriction and increased consumption of fresh fruit, vegetables, and low fat dairy, alcohol moderation, and smoking cessation.;Monitor prescription use compliance.    Expected Outcomes Short Term: Continued assessment and intervention until BP is < 140/69mm HG in hypertensive participants. < 130/52mm HG in hypertensive participants with diabetes, heart failure or chronic kidney disease.;Long Term: Maintenance of blood pressure at goal levels.    Intervention Provide education and support for participant on nutrition & aerobic/resistive exercise along with prescribed medications to achieve LDL 70mg , HDL >40mg .    Expected Outcomes Short Term: Participant states understanding of desired cholesterol values and is compliant with medications prescribed. Participant is following exercise prescription and nutrition guidelines.;Long Term: Cholesterol controlled with medications as prescribed, with individualized exercise RX and with personalized nutrition plan. Value goals: LDL < 70mg , HDL > 40 mg.             Education:Diabetes - Individual verbal and written  instruction to review signs/symptoms of diabetes, desired ranges of glucose level fasting, after meals and with exercise. Acknowledge that pre and post exercise glucose checks will be done for 3 sessions at entry of program. Flowsheet Row Pulmonary Rehab from 04/27/2022 in [facility] Cardiac and Pulmonary Rehab  Date 04/10/22  Educator [facility]  Instruction Review Code 1- Verbalizes Understanding       Know Your Numbers and Heart Failure: - Group verbal and visual instruction to discuss disease risk factors for cardiac and pulmonary disease and treatment options.  Reviews associated critical values for Overweight/Obesity, Hypertension, Cholesterol, and Diabetes.  Discusses basics  of heart failure: signs/symptoms and treatments.  Introduces Heart Failure Zone chart for action plan for heart failure.  Written material given at graduation.   Core Components/Risk Factors/Patient Goals Review:    Core Components/Risk Factors/Patient Goals at Discharge (Final Review):    ITP Comments:  ITP Comments     Row Name 04/10/22 0937 04/12/22 1141 04/25/22 0840 05/03/22 0927     ITP Comments Virtual Visit completed. Patient informed on EP and RD appointment and 6 Minute walk test. Patient also informed of patient health questionnaires on My Chart. Patient Verbalizes understanding. Visit diagnosis can be found in [facility] 02/01/2022. Completed 6MWT and gym orientation. Initial ITP created and sent for review to Dr. Zetta Bills, Medical Director. First full day of exercise!  Patient was oriented to gym and equipment including functions, settings, policies, and procedures.  Patient's individual exercise prescription and treatment plan were reviewed.  All starting workloads were established based on the results of the 6 minute walk test done at initial orientation visit.  The plan for exercise progression was also introduced and progression will be customized based on patient's performance and goals. 30 Day review completed.  Medical Director ITP review done, changes made as directed, and signed approval by Medical Director.     new to program             Comments:

## 2022-05-11 ENCOUNTER — Encounter: Payer: Medicare Other | Admitting: *Deleted

## 2022-05-11 DIAGNOSIS — D869 Sarcoidosis, unspecified: Secondary | ICD-10-CM

## 2022-05-11 NOTE — Progress Notes (Signed)
Daily Session Note  Patient Details  Name: Ethan Frye MRN: [id number] Date of Birth: January 03, 1951 Referring Provider:   Flowsheet Row Pulmonary Rehab from 04/12/2022 in [facility] Cardiac and Pulmonary Rehab  Referring Provider Eulis Manly MD       Encounter Date: 05/11/2022  Check In:  Session Check In - 05/11/22 0806       Check-In   Supervising physician immediately available to respond to emergencies See telemetry face sheet for immediately available ER MD    Location [facility]-Cardiac & Pulmonary Rehab    Staff Present Darlyne Russian, RN, ADN;Jessica Luan Pulling, MA, RCEP, CCRP, CCET;Joseph Cottonwood, Darla Lesches, MS, ACSM CEP, Exercise Physiologist    Virtual Visit No    Medication changes reported     No    Fall or balance concerns reported    No    Warm-up and Cool-down Performed on first and last piece of equipment    Resistance Training Performed Yes    VAD Patient? No    PAD/SET Patient? No      Pain Assessment   Currently in Pain? No/denies                Social History   Tobacco Use  Smoking Status Former   Packs/day: 0.25   Years: 8.00   Additional pack years: 0.00   Total pack years: 2.00   Types: Cigarettes   Quit date: 03/16/1977   Years since quitting: 45.1   Passive exposure: Past  Smokeless Tobacco Former    Goals Met:  Independence with exercise equipment Exercise tolerated well No report of concerns or symptoms today Strength training completed today  Goals Unmet:  Not Applicable  Comments: Pt able to follow exercise prescription today without complaint.  Will continue to monitor for progression.    Dr. Emily Filbert is Medical Director for [place].  Dr. Ottie Glazier is Medical Director for [facility] Pulmonary Rehabilitation.

## 2022-05-16 ENCOUNTER — Encounter: Payer: Medicare Other | Attending: Pulmonary Disease | Admitting: *Deleted

## 2022-05-16 DIAGNOSIS — D869 Sarcoidosis, unspecified: Secondary | ICD-10-CM | POA: Diagnosis present

## 2022-05-16 DIAGNOSIS — J449 Chronic obstructive pulmonary disease, unspecified: Secondary | ICD-10-CM | POA: Diagnosis not present

## 2022-05-16 DIAGNOSIS — Z87891 Personal history of nicotine dependence: Secondary | ICD-10-CM | POA: Diagnosis not present

## 2022-05-16 NOTE — Progress Notes (Signed)
Daily Session Note  Patient Details  Name: Ethan Frye MRN: [id number] Date of Birth: 07-Jul-1950 Referring Provider:   Flowsheet Row Pulmonary Rehab from 04/12/2022 in [facility] Cardiac and Pulmonary Rehab  Referring Provider Eulis Manly MD       Encounter Date: 05/16/2022  Check In:  Session Check In - 05/16/22 0845       Check-In   Supervising physician immediately available to respond to emergencies See telemetry face sheet for immediately available ER MD    Location [facility]-Cardiac & Pulmonary Rehab    Staff Present Heath Lark, RN, BSN, Kathaleen Maser, MS, ACSM CEP, Exercise Physiologist;Other   Gretchen Short BS Exercise Science   Virtual Visit No    Medication changes reported     No    Fall or balance concerns reported    No    Warm-up and Cool-down Performed on first and last piece of equipment    Resistance Training Performed Yes    VAD Patient? No    PAD/SET Patient? No      Pain Assessment   Currently in Pain? No/denies                Social History   Tobacco Use  Smoking Status Former   Packs/day: 0.25   Years: 8.00   Additional pack years: 0.00   Total pack years: 2.00   Types: Cigarettes   Quit date: 03/16/1977   Years since quitting: 45.1   Passive exposure: Past  Smokeless Tobacco Former    Goals Met:  Proper associated with RPD/PD & O2 Sat Independence with exercise equipment Exercise tolerated well No report of concerns or symptoms today  Goals Unmet:  Not Applicable  Comments: Pt able to follow exercise prescription today without complaint.  Will continue to monitor for progression.    Dr. Emily Filbert is Medical Director for [place].  Dr. Ottie Glazier is Medical Director for [facility] Pulmonary Rehabilitation.

## 2022-05-18 ENCOUNTER — Encounter: Payer: Medicare Other | Admitting: *Deleted

## 2022-05-18 DIAGNOSIS — D869 Sarcoidosis, unspecified: Secondary | ICD-10-CM | POA: Diagnosis not present

## 2022-05-18 LAB — GLUCOSE, CAPILLARY: Glucose-Capillary: 123 mg/dL — ABNORMAL HIGH (ref 70–99)

## 2022-05-18 NOTE — Progress Notes (Signed)
Daily Session Note  Patient Details  Name: Ethan Frye MRN: [id number] Date of Birth: 08-17-1950 Referring Provider:   Flowsheet Row Pulmonary Rehab from 04/12/2022 in [facility] Cardiac and Pulmonary Rehab  Referring Provider Eulis Manly MD       Encounter Date: 05/18/2022  Check In:  Session Check In - 05/18/22 0825       Check-In   Supervising physician immediately available to respond to emergencies See telemetry face sheet for immediately available ER MD    Location [facility]-Cardiac & Pulmonary Rehab    Staff Present Darlyne Russian, RN, Lorin Mercy, MS, ACSM CEP, Exercise Physiologist;Joseph Tessie Fass, [place]    Virtual Visit No    Medication changes reported     No    Fall or balance concerns reported    No    Warm-up and Cool-down Performed on first and last piece of equipment    Resistance Training Performed Yes    VAD Patient? No    PAD/SET Patient? No      Pain Assessment   Currently in Pain? No/denies                Social History   Tobacco Use  Smoking Status Former   Packs/day: 0.25   Years: 8.00   Additional pack years: 0.00   Total pack years: 2.00   Types: Cigarettes   Quit date: 03/16/1977   Years since quitting: 45.2   Passive exposure: Past  Smokeless Tobacco Former    Goals Met:  Independence with exercise equipment Exercise tolerated well No report of concerns or symptoms today Strength training completed today  Goals Unmet:  Not Applicable  Comments: Pt able to follow exercise prescription today without complaint.  Will continue to monitor for progression.    Dr. Emily Filbert is Medical Director for [place].  Dr. Ottie Glazier is Medical Director for [facility] Pulmonary Rehabilitation.

## 2022-05-23 ENCOUNTER — Encounter: Payer: Medicare Other | Admitting: *Deleted

## 2022-05-23 DIAGNOSIS — D869 Sarcoidosis, unspecified: Secondary | ICD-10-CM

## 2022-05-23 NOTE — Progress Notes (Signed)
Daily Session Note  Patient Details  Name: Ethan Frye MRN: [id number] Date of Birth: [birth year] Referring Provider:   Flowsheet Row Pulmonary Rehab from 04/12/2022 in [facility] Cardiac and Pulmonary Rehab  Referring Provider Landry Dyke MD       Encounter Date: 05/23/2022  Check In:  Session Check In - 05/23/22 0755       Check-In   Supervising physician immediately available to respond to emergencies See telemetry face sheet for immediately available ER MD    Location [facility]-Cardiac & Pulmonary Rehab    Staff Present Cyndia Diver, RN, BSN, Damita Dunnings, MA, RCEP, CCRP, Zackery Barefoot, MS, ACSM CEP, Exercise Physiologist    Virtual Visit No    Medication changes reported     No    Fall or balance concerns reported    No    Tobacco Cessation No Change    Warm-up and Cool-down Performed on first and last piece of equipment    Resistance Training Performed Yes    VAD Patient? No    PAD/SET Patient? No      Pain Assessment   Currently in Pain? No/denies    Multiple Pain Sites No                Social History   Tobacco Use  Smoking Status Former   Packs/day: 0.25   Years: 8.00   Additional pack years: 0.00   Total pack years: 2.00   Types: Cigarettes   Quit date: 03/16/1977   Years since quitting: 45.2   Passive exposure: Past  Smokeless Tobacco Former    Goals Met:  Independence with exercise equipment Exercise tolerated well No report of concerns or symptoms today  Goals Unmet:  Not Applicable  Comments: Pt able to follow exercise prescription today without complaint.  Will continue to monitor for progression.    Dr. Bethann Punches is Medical Director for [facility] Cardiac Rehabilitation.  Dr. Vida Rigger is Medical Director for [facility] Pulmonary Rehabilitation.

## 2022-05-23 NOTE — Progress Notes (Signed)
Daily Session Note  Patient Details  Name: Ethan Frye MRN: [id number] Date of Birth: 1950-11-08 Referring Provider:   Flowsheet Row Pulmonary Rehab from 04/12/2022 in [facility] Cardiac [facility] Pul[facility]  Referring Provider Landry Dyke MD       Encounter Date: 05/23/2022  Check In:  Session Check In - 05/23/22 0758       Check-In   Supervising physician immediately available to respond to emergencies See telemetry face sheet for immediately available ER MD    Location [facility]-Cardiac & Pulmonary Rehab    Staff Present Cyndia Diver, RN, BSN, Damita Dunnings, MA, RCEP, CCRP, Zackery Barefoot, MS, ACSM CEP, Exercise Physiologist    Virtual Visit No    Medication changes reported     No    Fall or balance concerns reported    No    Tobacco Cessation No Change    Warm-up and Cool-down Performed on first and last piece of equipment    Resistance Training Performed Yes    VAD Patient? No    PAD/SET Patient? No      Pain Assessment   Currently in Pain? No/denies                Social History   Tobacco Use  Smoking Status Former   Packs/day: 0.25   Years: 8.00   Additional pack years: 0.00   Total pack years: 2.00   Types: Cigarettes   Quit date: 03/16/1977   Years since quitting: 45.2   Passive exposure: Past  Smokeless Tobacco Former    Goals Met:  Independence with exercise equipment Exercise tolerated well No report of concerns or symptoms today  Goals Unmet:  Not Applicable  Comments: Pt able to follow exercise prescription today without complaint.  Will continue to monitor for progression.    Dr. Bethann Punches is Medical Director for [facility] Cardiac Rehabilitation.  Dr. Vida Rigger is Medical Director for [facility] Pulmonary Rehabilitation.

## 2022-05-25 ENCOUNTER — Encounter: Payer: Medicare Other | Admitting: *Deleted

## 2022-05-30 ENCOUNTER — Encounter: Payer: Medicare Other | Admitting: *Deleted

## 2022-05-30 DIAGNOSIS — D869 Sarcoidosis, unspecified: Secondary | ICD-10-CM

## 2022-05-30 NOTE — Progress Notes (Signed)
Daily Session Note  Patient Details  Name: Ethan Frye MRN: [id number] Date of Birth: March 31, 1950 Referring Provider:   Flowsheet Row Pulmonary Rehab from 04/12/2022 in [facility] Cardiac and Pulmonary Rehab  Referring Provider Landry Dyke MD       Encounter Date: 05/30/2022  Check In:  Session Check In - 05/30/22 0811       Check-In   Supervising physician immediately available to respond to emergencies See telemetry face sheet for immediately available ER MD    Location [facility]-Cardiac & Pulmonary Rehab    Staff Present Cyndia Diver, RN, BSN, Damita Dunnings, MA, RCEP, CCRP, Zackery Barefoot, MS, ACSM CEP, Exercise Physiologist    Virtual Visit No    Medication changes reported     No    Fall or balance concerns reported    No    Tobacco Cessation No Change    Warm-up and Cool-down Performed on first and last piece of equipment    Resistance Training Performed Yes    VAD Patient? No      Pain Assessment   Currently in Pain? No/denies                Social History   Tobacco Use  Smoking Status Former   Packs/day: 0.25   Years: 8.00   Additional pack years: 0.00   Total pack years: 2.00   Types: Cigarettes   Quit date: 03/16/1977   Years since quitting: 45.2   Passive exposure: Past  Smokeless Tobacco Former    Goals Met:  Independence with exercise equipment Exercise tolerated well No report of concerns or symptoms today  Goals Unmet:  Not Applicable  Comments: Pt able to follow exercise prescription today without complaint.  Will continue to monitor for progression.    Dr. Bethann Punches is Medical Director for [facility] Cardiac Rehabilitation.  Dr. Vida Rigger is Medical Director for [facility] Pulmonary Rehabilitation.

## 2022-05-31 ENCOUNTER — Encounter: Payer: Self-pay | Admitting: *Deleted

## 2022-05-31 DIAGNOSIS — D869 Sarcoidosis, unspecified: Secondary | ICD-10-CM

## 2022-05-31 NOTE — Progress Notes (Signed)
Pulmonary Individual Treatment Plan  Patient Details  Name: Ethan Frye MRN: [id number] Date of Birth: 03/08/1950 Referring Provider:   Flowsheet Row Pulmonary Rehab from 04/12/2022 in [facility] Cardiac and Pulmonary Rehab  Referring Provider Landry Dyke MD       Initial Encounter Date:  Flowsheet Row Pulmonary Rehab from 04/12/2022 in [facility] Cardiac and Pulmonary Rehab  Date 04/12/22       Visit Diagnosis: Sarcoidosis  Patient's Home Medications on Admission:  Current Outpatient Medications:    acidophilus (RISAQUAD) CAPS capsule, Take 1 capsule by mouth daily., Disp: , Rfl:    albuterol (PROVENTIL) (2.5 MG/3ML) 0.083% nebulizer solution, Take 2.5 mg by nebulization every 6 (six) hours as needed for wheezing or shortness of breath. (Patient not taking: Reported on 04/10/2022), Disp: , Rfl:    albuterol (VENTOLIN HFA) 108 (90 Base) MCG/ACT inhaler, Inhale 2 puffs into the lungs every 6 (six) hours as needed for wheezing or shortness of breath., Disp: , Rfl:    aspirin EC 81 MG tablet, Take 81 mg by mouth daily. Swallow whole., Disp: , Rfl:    aspirin EC 81 MG tablet, Take by mouth. (Patient not taking: Reported on 04/10/2022), Disp: , Rfl:    atorvastatin (LIPITOR) 20 MG tablet, Take 20 mg by mouth daily. (Patient not taking: Reported on 04/10/2022), Disp: , Rfl:    atorvastatin (LIPITOR) 40 MG tablet, Take by mouth., Disp: , Rfl:    azithromycin (ZITHROMAX) 500 MG tablet, Take 500 mg by mouth every Monday, Wednesday, and Friday. (Patient not taking: Reported on 04/10/2022), Disp: , Rfl:    Blood Glucose Monitoring Suppl (FIFTY50 GLUCOSE METER 2.0) w/Device KIT, See admin instructions., Disp: , Rfl:    budesonide (PULMICORT) 0.5 MG/2ML nebulizer solution, Inhale into the lungs., Disp: , Rfl:    carvedilol (COREG) 25 MG tablet, Take 25 mg by mouth 2 (two) times daily. (Patient not taking: Reported on 04/10/2022), Disp: , Rfl:    carvedilol (COREG) 25 MG tablet, Take 1 tablet by mouth 2  (two) times daily with a meal., Disp: , Rfl:    celecoxib (CELEBREX) 200 MG capsule, Take 200 mg by mouth daily as needed for pain. (Patient not taking: Reported on 04/10/2022), Disp: , Rfl:    celecoxib (CELEBREX) 200 MG capsule, Take by mouth., Disp: , Rfl:    cetirizine (ZYRTEC) 10 MG tablet, Take 10 mg by mouth daily., Disp: , Rfl:    cholecalciferol (VITAMIN D) 25 MCG (1000 UNIT) tablet, Take 1,000 Units by mouth daily., Disp: , Rfl:    clonazePAM (KLONOPIN) 1 MG tablet, Take 1 mg by mouth 2 (two) times daily. (Patient not taking: Reported on 04/10/2022), Disp: , Rfl:    clopidogrel (PLAVIX) 75 MG tablet, clopidogrel 75 mg tablet  TAKE 1 TABLET (75 MG TOTAL) BY MOUTH ONCE DAILY., Disp: , Rfl:    Cysteamine Bitartrate (PROCYSBI) 300 MG PACK, Use 1 each 3 (three) times daily Use as instructed., Disp: , Rfl:    diclofenac Sodium (VOLTAREN) 1 % GEL, Apply 2 g topically 4 (four) times daily. (Patient not taking: Reported on 04/10/2022), Disp: , Rfl:    diclofenac Sodium (VOLTAREN) 1 % GEL, Apply 2 g topically 4 (four) times daily., Disp: , Rfl:    doxepin (SINEQUAN) 50 MG capsule, Take 150 mg by mouth at bedtime. (Patient not taking: Reported on 04/10/2022), Disp: , Rfl:    doxepin (SINEQUAN) 50 MG capsule, Take by mouth., Disp: , Rfl:    DULoxetine (CYMBALTA) 60 MG  capsule, Take 60 mg by mouth daily. (Patient not taking: Reported on 04/10/2022), Disp: , Rfl:    DULoxetine (CYMBALTA) 60 MG capsule, Take 1 tablet by mouth daily., Disp: , Rfl:    ferrous sulfate 325 (65 FE) MG tablet, Take 325 mg by mouth daily with breakfast., Disp: , Rfl:    fluticasone-salmeterol (ADVAIR) 500-50 MCG/ACT AEPB, Inhale 1 puff into the lungs every 12 (twelve) hours., Disp: , Rfl:    glucose blood (PRECISION QID TEST) test strip, Use 1 each (1 strip total) once daily Use as instructed., Disp: , Rfl:    HYDROcodone-acetaminophen (NORCO/VICODIN) 5-325 MG tablet, Take 1 tablet by mouth every 4 (four) hours as needed for pain.,  Disp: , Rfl:    ketoconazole (NIZORAL) 2 % cream, ketoconazole 2 % topical cream  APPLY TWICE DAILY TO RASH ON FACE AND GROIN, Disp: , Rfl:    lisinopril (ZESTRIL) 40 MG tablet, Take 40 mg by mouth daily., Disp: , Rfl:    losartan-hydrochlorothiazide (HYZAAR) 100-25 MG tablet, Take 1 tablet by mouth daily., Disp: , Rfl:    metFORMIN (GLUCOPHAGE) 500 MG tablet, Take 500-1,000 mg by mouth 2 (two) times daily with a meal. (Patient not taking: Reported on 04/10/2022), Disp: , Rfl:    metFORMIN (GLUCOPHAGE) 500 MG tablet, Take by mouth., Disp: , Rfl:    methadone (DOLOPHINE) 5 MG tablet, Take 2.5-5 mg by mouth 2 (two) times daily. Take 2.5 mg (one-half tablet) twice daily for one week then increase to 5 mg (one tablet) twice daily, thereafter, Disp: , Rfl:    montelukast (SINGULAIR) 10 MG tablet, Take 10 mg by mouth daily. (Patient not taking: Reported on 04/10/2022), Disp: , Rfl:    montelukast (SINGULAIR) 10 MG tablet, Take 1 tablet by mouth daily., Disp: , Rfl:    moxifloxacin (VIGAMOX) 0.5 % ophthalmic solution, moxifloxacin 0.5 % eye drops  PUT 1 DROP IN RIGHT EYE 4 TIMES A DAY FOR 7 DAYS OR AS DIRECTED, Disp: , Rfl:    Multiple Vitamins-Minerals (MULTIVITAMIN WITH MINERALS) tablet, Take 1 tablet by mouth daily., Disp: , Rfl:    naloxone (NARCAN) nasal spray 4 mg/0.1 mL, Place into the nose., Disp: , Rfl:    nitroGLYCERIN (NITROSTAT) 0.4 MG SL tablet, nitroglycerin 0.4 mg sublingual tablet  PLACE 1 TABLET UNDER THE TONGUE EVERY FOR CHEST PAIN, MAY TAKE UP TO 3 DOSES, Disp: , Rfl:    nystatin (MYCOSTATIN) 100000 UNIT/ML suspension, Take 2 mLs by mouth in the morning, at noon, in the evening, and at bedtime. (Patient not taking: Reported on 04/10/2022), Disp: , Rfl:    nystatin (MYCOSTATIN) 100000 UNIT/ML suspension, nystatin 100,000 unit/mL oral suspension  SWISH AND SWALLOW 5 MLS 4 (FOUR) TIMES DAILY., Disp: , Rfl:    omeprazole (PRILOSEC) 40 MG capsule, Take 40 mg by mouth daily. (Patient not  taking: Reported on 04/10/2022), Disp: , Rfl:    omeprazole (PRILOSEC) 40 MG capsule, Take 1 tablet by mouth daily., Disp: , Rfl:    oxyCODONE (OXY IR/ROXICODONE) 5 MG immediate release tablet, Take by mouth., Disp: , Rfl:    pimecrolimus (ELIDEL) 1 % cream, Apply 1 application topically 2 (two) times daily., Disp: , Rfl:    pimecrolimus (ELIDEL) 1 % cream, Elidel 1 % topical cream  APPLY TWICE DAILY TO AFFECTED AREA(S) FOR 1-2 WEEKS (Patient not taking: Reported on 04/10/2022), Disp: , Rfl:    polyethylene glycol (MIRALAX / GLYCOLAX) 17 g packet, Take 17 g by mouth 2 (two) times daily. (Patient  not taking: Reported on 04/10/2022), Disp: , Rfl:    polyethylene glycol powder (GLYCOLAX/MIRALAX) 17 GM/SCOOP powder, Take by mouth., Disp: , Rfl:    pregabalin (LYRICA) 75 MG capsule, Take 75 mg by mouth 3 (three) times daily., Disp: , Rfl:    Semaglutide,0.25 or 0.5MG /DOS, (OZEMPIC, 0.25 OR 0.5 MG/DOSE,) 2 MG/3ML SOPN, Inject into the skin., Disp: , Rfl:    senna-docusate (SENOKOT-S) 8.6-50 MG tablet, Take 1 tablet by mouth daily. (Patient not taking: Reported on 04/10/2022), Disp: , Rfl:    senna-docusate (SENOKOT-S) 8.6-50 MG tablet, Take 2 tablets by mouth daily., Disp: , Rfl:    spironolactone (ALDACTONE) 25 MG tablet, Take 12.5 mg by mouth daily., Disp: , Rfl:    SUMAtriptan (IMITREX) 50 MG tablet, Take 50 mg by mouth as directed., Disp: , Rfl:    tiZANidine (ZANAFLEX) 4 MG tablet, Take 4 mg by mouth 3 (three) times daily., Disp: , Rfl:    traMADol (ULTRAM) 50 MG tablet, Take 50 mg by mouth every 6 (six) hours as needed for pain., Disp: , Rfl:    vitamin B-12 (CYANOCOBALAMIN) 1000 MCG tablet, Take 1,000 mcg by mouth daily., Disp: , Rfl:   Past Medical History: No past medical history on file.  Tobacco Use: Social History   Tobacco Use  Smoking Status Former   Packs/day: 0.25   Years: 8.00   Additional pack years: 0.00   Total pack years: 2.00   Types: Cigarettes   Quit date: 03/16/1977    Years since quitting: 45.2   Passive exposure: Past  Smokeless Tobacco Former    Labs: Investment banker, operational       Latest Ref Rng & Units 11/04/2020 11/05/2020  Labs for ITP Cardiac and Pulmonary Rehab  Hemoglobin A1c 4.8 - 5.6 % - 6.2   PH, Arterial 7.350 - 7.450 7.24  7.34   PCO2 arterial 32.0 - 48.0 mmHg 44  37   Bicarbonate 20.0 - 28.0 mmol/L 20.2  18.9  20.0   Acid-base deficit 0.0 - 2.0 mmol/L 6.9  8.2  5.2   O2 Saturation % 75.8  89.5  99.2      Pulmonary Assessment Scores:  Pulmonary Assessment Scores     Row Name 04/12/22 1259         ADL UCSD   ADL Phase Entry     SOB Score total 91     Rest 3     Walk 4     Stairs 5     Bath 2     Dress 3     Shop 4       CAT Score   CAT Score 32       mMRC Score   mMRC Score 4              UCSD: Self-administered rating of dyspnea associated with activities of daily living (ADLs) 6-point scale (0 = "not at all" to 5 = "maximal or unable to do because of breathlessness")  Scoring Scores range from 0 to 120.  Minimally important difference is 5 units  CAT: CAT can identify the health impairment of COPD patients and is better correlated with disease progression.  CAT has a scoring range of zero to 40. The CAT score is classified into four groups of low (less than 10), medium (10 - 20), high (21-30) and very high (31-40) based on the impact level of disease on health status. A CAT score over 10 suggests significant symptoms.  A worsening CAT score  could be explained by an exacerbation, poor medication adherence, poor inhaler technique, or progression of COPD or comorbid conditions.  CAT MCID is 2 points  mMRC: mMRC (Modified Medical Research Council) Dyspnea Scale is used to assess the degree of baseline functional disability in patients of respiratory disease due to dyspnea. No minimal important difference is established. A decrease in score of 1 point or greater is considered a positive change.   Pulmonary Function  Assessment:  Pulmonary Function Assessment - 04/12/22 1259       Breath   Shortness of Breath Yes;Limiting activity;Panic with Shortness of Breath             Exercise Target Goals: Exercise Program Goal: Individual exercise prescription set using results from initial 6 min walk test and THRR while considering  patient's activity barriers and safety.   Exercise Prescription Goal: Initial exercise prescription builds to 30-45 minutes a day of aerobic activity, 2-3 days per week.  Home exercise guidelines will be given to patient during program as part of exercise prescription that the participant will acknowledge.  Education: Aerobic Exercise: - Group verbal and visual presentation on the components of exercise prescription. Introduces F.I.T.T principle from ACSM for exercise prescriptions.  Reviews F.I.T.T. principles of aerobic exercise including progression. Written material given at graduation. Flowsheet Row Pulmonary Rehab from 05/18/2022 in [facility] Cardiac and Pulmonary Rehab  Education need identified 04/12/22  Date 05/11/22  Educator [facility]  Instruction Review Code 1- Verbalizes Understanding       Education: Resistance Exercise: - Group verbal and visual presentation on the components of exercise prescription. Introduces F.I.T.T principle from ACSM for exercise prescriptions  Reviews F.I.T.T. principles of resistance exercise including progression. Written material given at graduation. Flowsheet Row Pulmonary Rehab from 05/18/2022 in [facility] Cardiac and Pulmonary Rehab  Date 05/18/22  Educator NT  Instruction Review Code 1- Verbalizes Understanding        Education: Exercise & Equipment Safety: - Individual verbal instruction and demonstration of equipment use and safety with use of the equipment. Flowsheet Row Pulmonary Rehab from 05/18/2022 in [facility] Cardiac and Pulmonary Rehab  Date 04/10/22  Educator [facility]  Instruction Review Code 1- Verbalizes Understanding       Education:  Exercise Physiology & General Exercise Guidelines: - Group verbal and written instruction with models to review the exercise physiology of the cardiovascular system and associated critical values. Provides general exercise guidelines with specific guidelines to those with heart or lung disease.    Education: Flexibility, Balance, Mind/Body Relaxation: - Group verbal and visual presentation with interactive activity on the components of exercise prescription. Introduces F.I.T.T principle from ACSM for exercise prescriptions. Reviews F.I.T.T. principles of flexibility and balance exercise training including progression. Also discusses the mind body connection.  Reviews various relaxation techniques to help reduce and manage stress (i.e. Deep breathing, progressive muscle relaxation, and visualization). Balance handout provided to take home. Written material given at graduation. Flowsheet Row Pulmonary Rehab from 05/18/2022 in [facility] Cardiac and Pulmonary Rehab  Date 05/18/22  Educator NT  Instruction Review Code 1- Verbalizes Understanding       Activity Barriers & Risk Stratification:  Activity Barriers & Cardiac Risk Stratification - 04/12/22 1154       Activity Barriers & Cardiac Risk Stratification   Activity Barriers Arthritis;Fibromyalgia;Joint Problems;Shortness of Breath;Muscular Weakness;Deconditioning;Balance Concerns;History of Falls;Assistive Device   chronic pain from fibromyalgia and RA in hands/shoulders/knees, fell at home on Sunday bruised left side  6 Minute Walk:  6 Minute Walk     Row Name 04/12/22 1141         6 Minute Walk   Phase Initial     Distance 840 feet     Walk Time 6 minutes     # of Rest Breaks 0     MPH 1.59     METS 1.86     RPE 17     Perceived Dyspnea  3     VO2 Peak 6.51     Symptoms Yes (comment)     Comments SOB, chronic hip pain     Resting HR 78 bpm     Resting BP 118/68     Resting Oxygen Saturation  94 %     Exercise  Oxygen Saturation  during 6 min walk 89 %     Max Ex. HR 92 bpm     Max Ex. BP 146/74     2 Minute Post BP 136/72       Interval HR   1 Minute HR 86     2 Minute HR 91     3 Minute HR 90     4 Minute HR 91     5 Minute HR 92     6 Minute HR 92     2 Minute Post HR 78     Interval Heart Rate? Yes       Interval Oxygen   Interval Oxygen? Yes     Baseline Oxygen Saturation % 94 %     1 Minute Oxygen Saturation % 90 %     1 Minute Liters of Oxygen 0 L  Room Air     2 Minute Oxygen Saturation % 90 %     2 Minute Liters of Oxygen 0 L     3 Minute Oxygen Saturation % 89 %     3 Minute Liters of Oxygen 0 L     4 Minute Oxygen Saturation % 89 %     4 Minute Liters of Oxygen 0 L     5 Minute Oxygen Saturation % 90 %     5 Minute Liters of Oxygen 0 L     6 Minute Oxygen Saturation % 90 %     6 Minute Liters of Oxygen 0 L     2 Minute Post Oxygen Saturation % 94 %     2 Minute Post Liters of Oxygen 0 L             Oxygen Initial Assessment:  Oxygen Initial Assessment - 04/10/22 0952       Home Oxygen   Home Oxygen Device Home Concentrator    Sleep Oxygen Prescription Continuous;CPAP   does not wear   Liters per minute 0    Home Exercise Oxygen Prescription Continuous    Liters per minute 2   prn   Home Resting Oxygen Prescription Continuous    Liters per minute 2   prn   Compliance with Home Oxygen Use No      Initial 6 min Walk   Oxygen Used None      Program Oxygen Prescription   Program Oxygen Prescription None      Intervention   Short Term Goals To learn and exhibit compliance with exercise, home and travel O2 prescription;To learn and understand importance of maintaining oxygen saturations>88%;To learn and demonstrate proper use of respiratory medications;To learn and demonstrate proper pursed lip breathing techniques or other breathing techniques. ;To  learn and understand importance of monitoring SPO2 with pulse oximeter and demonstrate accurate use of the pulse  oximeter.    Long  Term Goals Verbalizes importance of monitoring SPO2 with pulse oximeter and return demonstration;Exhibits proper breathing techniques, such as pursed lip breathing or other method taught during program session;Demonstrates proper use of MDI's;Compliance with respiratory medication;Maintenance of O2 saturations>88%;Exhibits compliance with exercise, home  and travel O2 prescription             Oxygen Re-Evaluation:  Oxygen Re-Evaluation     Row Name 04/25/22 0840 05/23/22 0756           Program Oxygen Prescription   Program Oxygen Prescription None None        Home Oxygen   Home Oxygen Device Home Concentrator Home Concentrator      Sleep Oxygen Prescription Continuous;CPAP Continuous;CPAP      Liters per minute 0 0      Home Exercise Oxygen Prescription -- Continuous      Liters per minute 2 2      Home Resting Oxygen Prescription Continuous Continuous      Liters per minute 2 2      Compliance with Home Oxygen Use -- No        Goals/Expected Outcomes   Short Term Goals To learn and demonstrate proper pursed lip breathing techniques or other breathing techniques.  To learn and exhibit compliance with exercise, home and travel O2 prescription;To learn and understand importance of monitoring SPO2 with pulse oximeter and demonstrate accurate use of the pulse oximeter.;To learn and understand importance of maintaining oxygen saturations>88%;To learn and demonstrate proper pursed lip breathing techniques or other breathing techniques. ;To learn and demonstrate proper use of respiratory medications      Long  Term Goals -- Verbalizes importance of monitoring SPO2 with pulse oximeter and return demonstration;Exhibits proper breathing techniques, such as pursed lip breathing or other method taught during program session;Demonstrates proper use of MDI's;Compliance with respiratory medication;Maintenance of O2 saturations>88%;Exhibits compliance with exercise, home  and  travel O2 prescription      Comments Reviewed PLB technique with pt.  Talked about how it works and it's importance in maintaining their exercise saturations. Ethan Frye is scheduled for a sleep study as his equipment has not been working well for him and is out of date. He tried to use his CPAP but it was putting too much pressure on his teeth and nose.  He has been wearing his oxygen at home.  His saturations are doing pretty good overall.  Walking will still drop him on occassion.  He is doing well with his PLB.      Goals/Expected Outcomes Short: Become more profiecient at using PLB. Long: Become independent at using PLB. Short; Get sleep study done Long; Continue to use PLB               Oxygen Discharge (Final Oxygen Re-Evaluation):  Oxygen Re-Evaluation - 05/23/22 0756       Program Oxygen Prescription   Program Oxygen Prescription None      Home Oxygen   Home Oxygen Device Home Concentrator    Sleep Oxygen Prescription Continuous;CPAP    Liters per minute 0    Home Exercise Oxygen Prescription Continuous    Liters per minute 2    Home Resting Oxygen Prescription Continuous    Liters per minute 2    Compliance with Home Oxygen Use No      Goals/Expected Outcomes   Short Term Goals  To learn and exhibit compliance with exercise, home and travel O2 prescription;To learn and understand importance of monitoring SPO2 with pulse oximeter and demonstrate accurate use of the pulse oximeter.;To learn and understand importance of maintaining oxygen saturations>88%;To learn and demonstrate proper pursed lip breathing techniques or other breathing techniques. ;To learn and demonstrate proper use of respiratory medications    Long  Term Goals Verbalizes importance of monitoring SPO2 with pulse oximeter and return demonstration;Exhibits proper breathing techniques, such as pursed lip breathing or other method taught during program session;Demonstrates proper use of MDI's;Compliance with respiratory  medication;Maintenance of O2 saturations>88%;Exhibits compliance with exercise, home  and travel O2 prescription    Comments Ethan Frye is scheduled for a sleep study as his equipment has not been working well for him and is out of date. He tried to use his CPAP but it was putting too much pressure on his teeth and nose.  He has been wearing his oxygen at home.  His saturations are doing pretty good overall.  Walking will still drop him on occassion.  He is doing well with his PLB.    Goals/Expected Outcomes Short; Get sleep study done Long; Continue to use PLB             Initial Exercise Prescription:  Initial Exercise Prescription - 04/12/22 1100       Date of Initial Exercise RX and Referring Provider   Date 04/12/22    Referring Provider Landry Dyke MD      Oxygen   Oxygen --   may need oxygen is desaturates with exercise   Maintain Oxygen Saturation 88% or higher      Recumbant Bike   Level 1    RPM 50    Watts 10    Minutes 15    METs 1.8      T5 Nustep   Level 1    SPM 80    Minutes 15    METs 1.8      Biostep-RELP   Level 1    SPM 50    Minutes 15    METs 2      Track   Laps 21    Minutes 15    METs 2.14      Prescription Details   Frequency (times per week) 2    Duration Progress to 30 minutes of continuous aerobic without signs/symptoms of physical distress      Intensity   THRR 40-80% of Max Heartrate 106-135    Ratings of Perceived Exertion 11-13    Perceived Dyspnea 0-4      Progression   Progression Continue to progress workloads to maintain intensity without signs/symptoms of physical distress.      Resistance Training   Training Prescription Yes    Weight 3 lb    Reps 10-15             Perform Capillary Blood Glucose checks as needed.  Exercise Prescription Changes:   Exercise Prescription Changes     Row Name 04/12/22 1100 05/04/22 1400 05/18/22 1200         Response to Exercise   Blood Pressure (Admit) 118/68 126/70  104/66     Blood Pressure (Exercise) 146/74 140/76 --     Blood Pressure (Exit) 142/60 130/74 112/62     Heart Rate (Admit) 78 bpm 88 bpm 72 bpm     Heart Rate (Exercise) 92 bpm 87 bpm 93 bpm     Heart Rate (Exit) 74 bpm 85  bpm 84 bpm     Oxygen Saturation (Admit) 94 % 90 % 96 %     Oxygen Saturation (Exercise) 89 % 92 % 91 %     Oxygen Saturation (Exit) 94 % 95 % 95 %     Rating of Perceived Exertion (Exercise) 17 15 16      Perceived Dyspnea (Exercise) 3 3 3      Symptoms SOB, hip pain none SOB     Comments walk test results First two days of exercise --     Duration -- Progress to 30 minutes of  aerobic without signs/symptoms of physical distress Progress to 30 minutes of  aerobic without signs/symptoms of physical distress     Intensity -- THRR unchanged THRR unchanged       Progression   Progression -- Continue to progress workloads to maintain intensity without signs/symptoms of physical distress. Continue to progress workloads to maintain intensity without signs/symptoms of physical distress.     Average METs -- 1.86 1.95       Resistance Training   Training Prescription -- Yes Yes     Weight -- 3 lb 3 lb     Reps -- 10-15 10-15       Interval Training   Interval Training -- No No       Oxygen   Oxygen -- -- --  prn       Recumbant Bike   Level -- 1 --     Watts -- 10 --     Minutes -- 15 --     METs -- 2.32 --       NuStep   Level -- 1 1     Minutes -- 30 30     METs -- 1.8 2       Biostep-RELP   Level -- -- 1     Minutes -- -- 15     METs -- -- 2       Track   Laps -- -- 2  trying out to start     Minutes -- -- 15     METs -- -- 1       Oxygen   Maintain Oxygen Saturation -- 88% or higher 88% or higher              Exercise Comments:   Exercise Goals and Review:   Exercise Goals     Row Name 04/12/22 1255             Exercise Goals   Increase Physical Activity Yes       Intervention Provide advice, education, support and counseling  about physical activity/exercise needs.;Develop an individualized exercise prescription for aerobic and resistive training based on initial evaluation findings, risk stratification, comorbidities and participant's personal goals.       Expected Outcomes Short Term: Attend rehab on a regular basis to increase amount of physical activity.;Long Term: Add in home exercise to make exercise part of routine and to increase amount of physical activity.;Long Term: Exercising regularly at least 3-5 days a week.       Increase Strength and Stamina Yes       Intervention Provide advice, education, support and counseling about physical activity/exercise needs.;Develop an individualized exercise prescription for aerobic and resistive training based on initial evaluation findings, risk stratification, comorbidities and participant's personal goals.       Expected Outcomes Short Term: Increase workloads from initial exercise prescription for resistance, speed, and METs.;Short Term: Perform resistance training exercises  routinely during rehab and add in resistance training at home;Long Term: Improve cardiorespiratory fitness, muscular endurance and strength as measured by increased METs and functional capacity ( )       Able to understand and use rate of perceived exertion (RPE) scale Yes       Intervention Provide education and explanation on how to use RPE scale       Expected Outcomes Short Term: Able to use RPE daily in rehab to express subjective intensity level;Long Term:  Able to use RPE to guide intensity level when exercising independently       Able to understand and use Dyspnea scale Yes       Intervention Provide education and explanation on how to use Dyspnea scale       Expected Outcomes Short Term: Able to use Dyspnea scale daily in rehab to express subjective sense of shortness of breath during exertion;Long Term: Able to use Dyspnea scale to guide intensity level when exercising independently        Knowledge and understanding of Target Heart Rate Range (THRR) Yes       Intervention Provide education and explanation of THRR including how the numbers were predicted and where they are located for reference       Expected Outcomes Short Term: Able to state/look up THRR;Short Term: Able to use daily as guideline for intensity in rehab;Long Term: Able to use THRR to govern intensity when exercising independently       Able to check pulse independently Yes       Intervention Provide education and demonstration on how to check pulse in carotid and radial arteries.;Review the importance of being able to check your own pulse for safety during independent exercise       Expected Outcomes Short Term: Able to explain why pulse checking is important during independent exercise;Long Term: Able to check pulse independently and accurately       Understanding of Exercise Prescription Yes       Intervention Provide education, explanation, and written materials on patient's individual exercise prescription       Expected Outcomes Short Term: Able to explain program exercise prescription;Long Term: Able to explain home exercise prescription to exercise independently                Exercise Goals Re-Evaluation :  Exercise Goals Re-Evaluation     Row Name 04/25/22 0842 05/04/22 1433 05/18/22 1209 05/23/22 0749       Exercise Goal Re-Evaluation   Exercise Goals Review Understanding of Exercise Prescription;Knowledge and understanding of Target Heart Rate Range (THRR);Able to understand and use Dyspnea scale;Able to understand and use rate of perceived exertion (RPE) scale Increase Physical Activity;Increase Strength and Stamina;Understanding of Exercise Prescription Increase Physical Activity;Increase Strength and Stamina;Understanding of Exercise Prescription Increase Physical Activity;Increase Strength and Stamina;Understanding of Exercise Prescription    Comments Reviewed RPE scale, THR and program  prescription with pt today.  Pt voiced understanding and was given a copy of goals to take home. Ethan Frye is off to a good start in the program. He had an overall average MET level of 1.86 METs during his first two days of rehab. He also was able to tolerate 30 minutes on the T4 nustep at level 1. He tried to use the recumbent bike but had to switch back to the T4 nustep due to discomfort. We will continue to monitor his progress in the program. Ethan Frye missed a couple sessions last review as he out town. He has  been staying on the T4 Nustep as he is limited on fitting on machines. He also is limited by his breathing when walking. He tried out walking a couple laps on the track, we hope to that improve over time. His oxygen has been stayinh above 88%. We will continue to monitor. Ethan Frye is doing well in rehab.  He has been using his bike/seated elliptical at home.  He will usually go for about 15-20 min.  We talked about continuing to build up his time to 30 min.  He is trying to get there.  He does feel like his stamina is starting to get better.    Expected Outcomes Short: Use RPE daily to regulate intensity. Long: Follow program prescription in THR. Short: Continue to follow current exercise prescription. Long: Continue to improve strength and stamina. Short: Slowly add in more laps on track Long: Continue to increase overall MET level and stamina Short: Continue to add time in on bike at home Long: Conitnue to improve stamina             Discharge Exercise Prescription (Final Exercise Prescription Changes):  Exercise Prescription Changes - 05/18/22 1200       Response to Exercise   Blood Pressure (Admit) 104/66    Blood Pressure (Exit) 112/62    Heart Rate (Admit) 72 bpm    Heart Rate (Exercise) 93 bpm    Heart Rate (Exit) 84 bpm    Oxygen Saturation (Admit) 96 %    Oxygen Saturation (Exercise) 91 %    Oxygen Saturation (Exit) 95 %    Rating of Perceived Exertion (Exercise) 16    Perceived Dyspnea  (Exercise) 3    Symptoms SOB    Duration Progress to 30 minutes of  aerobic without signs/symptoms of physical distress    Intensity THRR unchanged      Progression   Progression Continue to progress workloads to maintain intensity without signs/symptoms of physical distress.    Average METs 1.95      Resistance Training   Training Prescription Yes    Weight 3 lb    Reps 10-15      Interval Training   Interval Training No      Oxygen   Oxygen --   prn     NuStep   Level 1    Minutes 30    METs 2      Biostep-RELP   Level 1    Minutes 15    METs 2      Track   Laps 2   trying out to start   Minutes 15    METs 1      Oxygen   Maintain Oxygen Saturation 88% or higher             Nutrition:  Target Goals: Understanding of nutrition guidelines, daily intake of sodium 1500mg , cholesterol 200mg , calories 30% from fat and 7% or less from saturated fats, daily to have 5 or more servings of fruits and vegetables.  Education: All About Nutrition: -Group instruction provided by verbal, written material, interactive activities, discussions, models, and posters to present general guidelines for heart healthy nutrition including fat, fiber, MyPlate, the role of sodium in heart healthy nutrition, utilization of the nutrition label, and utilization of this knowledge for meal planning. Follow up email sent as well. Written material given at graduation. Flowsheet Row Pulmonary Rehab from 05/18/2022 in [facility] Card[facility] Pulmonary Rehab  Education need identified 04/12/22  Biometrics:  Pre Biometrics - 04/12/22 1256       Pre Biometrics   Height 5\' 10"  (1.778 m)    Weight 219 lb 11.2 oz (99.7 kg)    Waist Circumference 41 inches    Hip Circumference 40 inches    Waist to Hip Ratio 1.03 %    BMI (Calculated) 31.52    Single Leg Stand 1.4 seconds              Nutrition Therapy Plan and Nutrition Goals:  Nutrition Therapy & Goals - 05/23/22 0753        Nutrition Therapy   RD appointment deferred Yes             Nutrition Assessments:  MEDIFICTS Score Key: ?70 Need to make dietary changes  40-70 Heart Healthy Diet ? 40 Therapeutic Level Cholesterol Diet  Flowsheet Row Pulmonary Rehab from 04/12/2022 in [facility] Cardiac and Pulmonary Rehab  Picture Your Plate Total Score on Admission 44      Picture Your Plate Scores: <16 Unhealthy dietary pattern with much room for improvement. 41-50 Dietary pattern unlikely to meet recommendations for good health and room for improvement. 51-60 More healthful dietary pattern, with some room for improvement.  >60 Healthy dietary pattern, although there may be some specific behaviors that could be improved.   Nutrition Goals Re-Evaluation:  Nutrition Goals Re-Evaluation     Row Name 05/23/22 0751             Goals   Nutrition Goal Work on mechanical eating       Comment Ethan Frye says that he is not hungry often but is eating some.  We talked about getting in at least two meals a day.  We talked about using meal replacement bars/shakes to help.  We talked about making sure he gets in enough calories each day.       Expected Outcome Short: Try meal replacement options Long: conitnue to improve diet                Nutrition Goals Discharge (Final Nutrition Goals Re-Evaluation):  Nutrition Goals Re-Evaluation - 05/23/22 0751       Goals   Nutrition Goal Work on mechanical eating    Comment Ethan Frye says that he is not hungry often but is eating some.  We talked about getting in at least two meals a day.  We talked about using meal replacement bars/shakes to help.  We talked about making sure he gets in enough calories each day.    Expected Outcome Short: Try meal replacement options Long: conitnue to improve diet             Psychosocial: Target Goals: Acknowledge presence or absence of significant depression and/or stress, maximize coping skills, provide positive support system. Participant  is able to verbalize types and ability to use techniques and skills needed for reducing stress and depression.   Education: Stress, Anxiety, and Depression - Group verbal and visual presentation to define topics covered.  Reviews how body is impacted by stress, anxiety, and depression.  Also discusses healthy ways to reduce stress and to treat/manage anxiety and depression.  Written material given at graduation. Flowsheet Row Pulmonary Rehab from 05/18/2022 in [facility] Cardiac and Pulmonary Rehab  Date 04/27/22  Educator [facility]  Instruction Review Code 1- [employer] Understanding       Education: Sleep Hygiene -Provides group verbal and written instruction about how sleep can affect your health.  Define sleep hygiene, discuss sleep cycles and  impact of sleep habits. Review good sleep hygiene tips.    Initial Review & Psychosocial Screening:  Initial Psych Review & Screening - 04/10/22 0956       Initial Review   Current issues with Current Anxiety/Panic;History of Depression;Current Psychotropic Meds;Current Sleep Concerns      Family Dynamics   Good Support System? Yes    Comments Gunnar states that his health and pain levels are the source of his depression and anxiety. He states he feels bad all the time. Ethan Frye can look to his wife, son and other family for support.      Barriers   Psychosocial barriers to participate in program The patient should benefit from training in stress management and relaxation.      Screening Interventions   Interventions Encouraged to exercise;To provide support and resources with identified psychosocial needs;Provide feedback about the scores to participant    Expected Outcomes Short Term goal: Utilizing psychosocial counselor, staff and physician to assist with identification of specific Stressors or current issues interfering with healing process. Setting desired goal for each stressor or current issue identified.;Long Term Goal: Stressors or current issues are  controlled or eliminated.;Short Term goal: Identification and review with participant of any Quality of Life or Depression concerns found by scoring the questionnaire.;Long Term goal: The participant improves quality of Life and PHQ9 Scores as seen by post scores and/or verbalization of changes             Quality of Life Scores:  Scores of 19 and below usually indicate a poorer quality of life in these areas.  A difference of  2-3 points is a clinically meaningful difference.  A difference of 2-3 points in the total score of the Quality of Life Index has been associated with significant improvement in overall quality of life, self-image, physical symptoms, and general health in studies assessing change in quality of life.  PHQ-9: Review Flowsheet       05/11/2022 04/12/2022  Depression screen PHQ 2/9  Decreased Interest 1 2  Down, Depressed, Hopeless 1 1  PHQ - 2 Score 2 3  Altered sleeping 3 2  Tired, decreased energy 3 2  Change in appetite 3 2  Feeling bad or failure about yourself  1 0  Trouble concentrating 0 0  Moving slowly or fidgety/restless 0 1  Suicidal thoughts 0 0  PHQ-9 Score 12 10  Difficult doing work/chores Somewhat difficult Somewhat difficult   Interpretation of Total Score  Total Score Depression Severity:  1-4 = Minimal depression, 5-9 = Mild depression, 10-14 = Moderate depression, 15-19 = Moderately severe depression, 20-27 = Severe depression   Psychosocial Evaluation and Intervention:  Psychosocial Evaluation - 04/10/22 0958       Psychosocial Evaluation & Interventions   Interventions Encouraged to exercise with the program and follow exercise prescription;Relaxation education;Stress management education    Comments Ethan Frye states that his health and pain levels are the source of his depression and anxiety. He states he feels bad all the time. Ethan Frye can look to his wife, son and other family for support.    Expected Outcomes Short: Start [facility] to  help with mood. Long: Maintain a healthy mental state.    Continue Psychosocial Services  Follow up required by staff             Psychosocial Re-Evaluation:  Psychosocial Re-Evaluation     Row Name 05/11/22 0809             Psychosocial Re-Evaluation  Current issues with Current Sleep Concerns       Comments Reviewed patient health questionnaire (PHQ-9) with patient for follow up. Previously, patients score indicated signs/symptoms of depression.  Reviewed to see if patient is improving symptom wise while in program.  Score declined and patient states that it is because he hasnt been sleeping well and has little energy.       Expected Outcomes Short: Continue to work toward an improvement in PHQ9 scores by attending [facility] regularly. Long: Continue to improve stress and depression coping skills by talking with staff and attending [facility] regularly and work toward a positive mental state.       Interventions Encouraged to attend Pulmonary Rehabilitation for the exercise       Continue Psychosocial Services  Follow up required by staff                Psychosocial Discharge (Final Psychosocial Re-Evaluation):  Psychosocial Re-Evaluation - 05/11/22 0809       Psychosocial Re-Evaluation   Current issues with Current Sleep Concerns    Comments Reviewed patient health questionnaire (PHQ-9) with patient for follow up. Previously, patients score indicated signs/symptoms of depression.  Reviewed to see if patient is improving symptom wise while in program.  Score declined and patient states that it is because he hasnt been sleeping well and has little energy.    Expected Outcomes Short: Continue to work toward an improvement in PHQ9 scores by attending [facility] regularly. Long: Continue to improve stress and depression coping skills by talking with staff and attending [facility] regularly and work toward a positive mental state.    Interventions Encouraged to attend Pulmonary  Rehabilitation for the exercise    Continue Psychosocial Services  Follow up required by staff             Education: Education Goals: Education classes will be provided on a weekly basis, covering required topics. Participant will state understanding/return demonstration of topics presented.  Learning Barriers/Preferences:  Learning Barriers/Preferences - 04/12/22 1257       Learning Barriers/Preferences   Learning Barriers Hearing    Learning Preferences None             General Pulmonary Education Topics:  Infection Prevention: - Provides verbal and written material to individual with discussion of infection control including proper hand washing and proper equipment cleaning during exercise session. Flowsheet Row Pulmonary Rehab from 05/18/2022 in [facility] Cardiac and Pulmonary Rehab  Date 04/10/22  Educator [facility]  Instruction Review Code 1- Verbalizes Understanding       Falls Prevention: - Provides verbal and written material to individual with discussion of falls prevention and safety. Flowsheet Row Pulmonary Rehab from 05/18/2022 in [facility] Cardiac and Pulmonary Rehab  Date 04/10/22  Educator [facility]  Instruction Review Code 1- Verbalizes Understanding       Chronic Lung Disease Review: - Group verbal instruction with posters, models, PowerPoint presentations and videos,  to review new updates, new respiratory medications, new advancements in procedures and treatments. Providing information on websites and "800" numbers for continued self-education. Includes information about supplement oxygen, available portable oxygen systems, continuous and intermittent flow rates, oxygen safety, concentrators, and Medicare reimbursement for oxygen. Explanation of Pulmonary Drugs, including class, frequency, complications, importance of spacers, rinsing mouth after steroid MDI's, and proper cleaning methods for nebulizers. Review of basic lung anatomy and physiology related to function,  structure, and complications of lung disease. Review of risk factors. Discussion about methods for diagnosing sleep apnea and types of  masks and machines for OSA. Includes a review of the use of types of environmental controls: home humidity, furnaces, filters, dust mite/pet prevention, HEPA vacuums. Discussion about weather changes, air quality and the benefits of nasal washing. Instruction on Warning signs, infection symptoms, calling MD promptly, preventive modes, and value of vaccinations. Review of effective airway clearance, coughing and/or vibration techniques. Emphasizing that all should Create an Action Plan. Written material given at graduation. Flowsheet Row Pulmonary Rehab from 05/18/2022 in [facility] Cardiac and Pulmonary Rehab  Education need identified 04/12/22       AED/CPR: - Group verbal and written instruction with the use of models to demonstrate the basic use of the AED with the basic ABC's of resuscitation.    Anatomy and Cardiac Procedures: - Group verbal and visual presentation and models provide information about basic cardiac anatomy and function. Reviews the testing methods done to diagnose heart disease and the outcomes of the test results. Describes the treatment choices: Medical Management, Angioplasty, or Coronary Bypass Surgery for treating various heart conditions including Myocardial Infarction, Angina, Valve Disease, and Cardiac Arrhythmias.  Written material given at graduation.   Medication Safety: - Group verbal and visual instruction to review commonly prescribed medications for heart and lung disease. Reviews the medication, class of the drug, and side effects. Includes the steps to properly store meds and maintain the prescription regimen.  Written material given at graduation.   Other: -Provides group and verbal instruction on various topics (see comments)   Knowledge Questionnaire Score:  Knowledge Questionnaire Score - 04/12/22 1257       Knowledge  Questionnaire Score   Pre Score 11/18              Core Components/Risk Factors/Patient Goals at Admission:  Personal Goals and Risk Factors at Admission - 04/12/22 1257       Core Components/Risk Factors/Patient Goals on Admission    Weight Management Yes;Weight Loss;Obesity    Intervention Weight Management: Develop a combined nutrition and exercise program designed to reach desired caloric intake, while maintaining appropriate intake of nutrient and fiber, sodium and fats, and appropriate energy expenditure required for the weight goal.;Weight Management: Provide education and appropriate resources to help participant work on and attain dietary goals.;Weight Management/Obesity: Establish reasonable short term and long term weight goals.;Obesity: Provide education and appropriate resources to help participant work on and attain dietary goals.    Admit Weight 219 lb 11.2 oz (99.7 kg)    Goal Weight: Short Term 214 lb (97.1 kg)    Goal Weight: Long Term 200 lb (90.7 kg)    Expected Outcomes Short Term: Continue to assess and modify interventions until short term weight is achieved;Long Term: Adherence to nutrition and physical activity/exercise program aimed toward attainment of established weight goal;Weight Loss: Understanding of general recommendations for a balanced deficit meal plan, which promotes 1-2 lb weight loss per week and includes a negative energy balance of [phone] kcal/d;Understanding recommendations for meals to include 15-35% energy as protein, 25-35% energy from fat, 35-60% energy from carbohydrates, less than 200mg  of dietary cholesterol, 20-35 gm of total fiber daily;Understanding of distribution of calorie intake throughout the day with the consumption of 4-5 meals/snacks    Improve shortness of breath with ADL's Yes    Intervention Provide education, individualized exercise plan and daily activity instruction to help decrease symptoms of SOB with activities of daily  living.    Expected Outcomes Short Term: Improve cardiorespiratory fitness to achieve a reduction of symptoms when performing ADLs;Long  Term: Be able to perform more ADLs without symptoms or delay the onset of symptoms    Increase knowledge of respiratory medications and ability to use respiratory devices properly  Yes    Intervention Provide education and demonstration as needed of appropriate use of medications, inhalers, and oxygen therapy.    Expected Outcomes Short Term: Achieves understanding of medications use. Understands that oxygen is a medication prescribed by physician. Demonstrates appropriate use of inhaler and oxygen therapy.;Long Term: Maintain appropriate use of medications, inhalers, and oxygen therapy.    Diabetes Yes    Intervention Provide education about signs/symptoms and action to take for hypo/hyperglycemia.;Provide education about proper nutrition, including hydration, and aerobic/resistive exercise prescription along with prescribed medications to achieve blood glucose in normal ranges: Fasting glucose 65-99 mg/dL    Expected Outcomes Short Term: Participant verbalizes understanding of the signs/symptoms and immediate care of hyper/hypoglycemia, proper foot care and importance of medication, aerobic/resistive exercise and nutrition plan for blood glucose control.;Long Term: Attainment of HbA1C < 7%.    Hypertension Yes    Intervention Provide education on lifestyle modifcations including regular physical activity/exercise, weight management, moderate sodium restriction and increased consumption of fresh fruit, vegetables, and low fat dairy, alcohol moderation, and smoking cessation.;Monitor prescription use compliance.    Expected Outcomes Short Term: Continued assessment and intervention until BP is < 140/46mm HG in hypertensive participants. < 130/27mm HG in hypertensive participants with diabetes, heart failure or chronic kidney disease.;Long Term: Maintenance of blood  pressure at goal levels.    Intervention Provide education and support for participant on nutrition & aerobic/resistive exercise along with prescribed medications to achieve LDL 70mg , HDL >40mg .    Expected Outcomes Short Term: Participant states understanding of desired cholesterol values and is compliant with medications prescribed. Participant is following exercise prescription and nutrition guidelines.;Long Term: Cholesterol controlled with medications as prescribed, with individualized exercise RX and with personalized nutrition plan. Value goals: LDL < 70mg , HDL > 40 mg.             Education:Diabetes - Individual verbal and written instruction to review signs/symptoms of diabetes, desired ranges of glucose level fasting, after meals and with exercise. Acknowledge that pre and post exercise glucose checks will be done for 3 sessions at entry of program. Flowsheet Row Pulmonary Rehab from 05/18/2022 in [facility] Cardiac and Pulmonary Rehab  Date 04/10/22  Educator [facility]  Instruction Review Code 1- Verbalizes Understanding       Know Your Numbers and Heart Failure: - Group verbal and visual instruction to discuss disease risk factors for cardiac and pulmonary disease and treatment options.  Reviews associated critical values for Overweight/Obesity, Hypertension, Cholesterol, and Diabetes.  Discusses basics of heart failure: signs/symptoms and treatments.  Introduces Heart Failure Zone chart for action plan for heart failure.  Written material given at graduation.   Core Components/Risk Factors/Patient Goals Review:   Goals and Risk Factor Review     Row Name 05/23/22 0753             Core Components/Risk Factors/Patient Goals Review   Personal Goals Review Weight Management/Obesity;Improve shortness of breath with ADL's;Increase knowledge of respiratory medications and ability to use respiratory devices properly.;Diabetes;Hypertension       Review Ethan Frye is doing well in rehab.  His weight  is trending down and is down to 211 here and 205 at home.  He is pleased with his progress.  His blood pressures are doing well haning around 130s/80s.  His sugars have been around 130s  as well.  He checks both daily at home.  His breathing is rough today.  Overall, it has been getting better. He is using his inhaler and nebulizer each day.       Expected Outcomes Short: Continue to use breathing treatments on bad breathing days Long: Conitnue to work on weight loss                Core Components/Risk Factors/Patient Goals at Discharge (Final Review):   Goals and Risk Factor Review - 05/23/22 0753       Core Components/Risk Factors/Patient Goals Review   Personal Goals Review Weight Management/Obesity;Improve shortness of breath with ADL's;Increase knowledge of respiratory medications and ability to use respiratory devices properly.;Diabetes;Hypertension    Review Ethan Frye is doing well in rehab.  His weight is trending down and is down to 211 here and 205 at home.  He is pleased with his progress.  His blood pressures are doing well haning around 130s/80s.  His sugars have been around 130s as well.  He checks both daily at home.  His breathing is rough today.  Overall, it has been getting better. He is using his inhaler and nebulizer each day.    Expected Outcomes Short: Continue to use breathing treatments on bad breathing days Long: Conitnue to work on weight loss             ITP Comments:  ITP Comments     Row Name 04/10/22 0937 04/12/22 1141 04/25/22 0840 05/03/22 0927 05/31/22 1349   ITP Comments Virtual Visit completed. Patient informed on EP and RD appointment and 6 Minute walk test. Patient also informed of patient health questionnaires on My Chart. Patient Verbalizes understanding. Visit diagnosis can be found in [facility] 02/01/2022. Completed and gym orientation. Initial ITP created and sent for review to Dr. Jinny Sanders, Medical Director. First full day of exercise!  Patient was  oriented to gym and equipment including functions, settings, policies, and procedures.  Patient's individual exercise prescription and treatment plan were reviewed.  All starting workloads were established based on the results of the 6 minute walk test done at initial orientation visit.  The plan for exercise progression was also introduced and progression will be customized based on patient's performance and goals. 30 Day review completed. Medical Director ITP review done, changes made as directed, and signed approval by Medical Director.     new to program 30 day review completed. ITP sent to Dr. Jinny Sanders, Medical Director of  Pulmonary Rehab. Continue with ITP unless changes are made by physician.            Comments: 30 day review

## 2022-06-01 ENCOUNTER — Encounter: Payer: Medicare Other | Admitting: *Deleted

## 2022-06-01 DIAGNOSIS — D869 Sarcoidosis, unspecified: Secondary | ICD-10-CM

## 2022-06-01 NOTE — Progress Notes (Signed)
Daily Session Note  Patient Details  Name: Ethan Frye MRN: [id number] Date of Birth: 27-Mar-1950 Referring Provider:   Flowsheet Row Pulmonary Rehab from 04/12/2022 in [facility] Cardiac and Pulmonary Rehab  Referring Provider Landry Dyke MD       Encounter Date: 06/01/2022  Check In:  Session Check In - 06/01/22 0750       Check-In   Supervising physician immediately available to respond to emergencies See telemetry face sheet for immediately available ER MD    Location [facility]-Cardiac & Pulmonary Rehab    Staff Present Lanny Hurst, RN, ADN;Jessica Juanetta Gosling, MA, RCEP, CCRP, CCET;Joseph [place], [place]    Virtual Visit No    Medication changes reported     No    Fall or balance concerns reported    No    Warm-up and Cool-down Performed on first and last piece of equipment    VAD Patient? No    PAD/SET Patient? No      Pain Assessment   Currently in Pain? No/denies                Social History   Tobacco Use  Smoking Status Former   Packs/day: 0.25   Years: 8.00   Additional pack years: 0.00   Total pack years: 2.00   Types: Cigarettes   Quit date: 03/16/1977   Years since quitting: 45.2   Passive exposure: Past  Smokeless Tobacco Former    Goals Met:  Independence with exercise equipment Exercise tolerated well No report of concerns or symptoms today Strength training completed today  Goals Unmet:  Not Applicable  Comments: Pt able to follow exercise prescription today without complaint.  Will continue to monitor for progression.  Reviewed home exercise with pt today.  Pt plans to walk and use recumbent bike at home for exercise.  Reviewed THR, pulse, RPE, sign and symptoms, pulse oximetery and when to call 911 or MD.  Also discussed weather considerations and indoor options.  Pt voiced understanding.   Dr. Bethann Punches is Medical Director for [facility] Cardiac Rehabilitation.  Dr. Vida Rigger is Medical Director for [facility] Pulmonary  Rehabilitation.

## 2022-06-06 ENCOUNTER — Encounter: Payer: Medicare Other | Admitting: *Deleted

## 2022-06-06 DIAGNOSIS — D869 Sarcoidosis, unspecified: Secondary | ICD-10-CM | POA: Diagnosis not present

## 2022-06-06 NOTE — Progress Notes (Signed)
Daily Session Note  Patient Details  Name: ALEPH NICKSON MRN: [id number] Date of Birth: 1950/07/23 Referring Provider:   Flowsheet Row Pulmonary Rehab from 04/12/2022 in [facility] Cardiac and Pulmonary Rehab  Referring Provider Landry Dyke MD       Encounter Date: 06/06/2022  Check In:  Session Check In - 06/06/22 0754       Check-In   Supervising physician immediately available to respond to emergencies See telemetry face sheet for immediately available ER MD    Location [facility]-Cardiac & Pulmonary Rehab    Staff Present Lanny Hurst, RN, ADN;Jessica Juanetta Gosling, MA, RCEP, CCRP, Zackery Barefoot, MS, ACSM CEP, Exercise Physiologist    Virtual Visit No    Medication changes reported     No    Fall or balance concerns reported    No    Warm-up and Cool-down Performed on first and last piece of equipment    Resistance Training Performed Yes    VAD Patient? No    PAD/SET Patient? No      Pain Assessment   Currently in Pain? No/denies                Social History   Tobacco Use  Smoking Status Former   Packs/day: 0.25   Years: 8.00   Additional pack years: 0.00   Total pack years: 2.00   Types: Cigarettes   Quit date: 03/16/1977   Years since quitting: 45.2   Passive exposure: Past  Smokeless Tobacco Former    Goals Met:  Independence with exercise equipment Exercise tolerated well No report of concerns or symptoms today Strength training completed today  Goals Unmet:  Not Applicable  Comments: Pt able to follow exercise prescription today without complaint.  Will continue to monitor for progression.    Dr. Bethann Punches is Medical Director for [facility] Cardiac Rehabilitation.  Dr. Vida Rigger is Medical Director for [facility] Pulmonary Rehabilitation.

## 2022-06-13 ENCOUNTER — Encounter: Payer: Medicare Other | Admitting: *Deleted

## 2022-06-13 DIAGNOSIS — D869 Sarcoidosis, unspecified: Secondary | ICD-10-CM | POA: Diagnosis not present

## 2022-06-13 NOTE — Progress Notes (Signed)
Daily Session Note  Patient Details  Name: Ethan Frye MRN: [id number] Date of Birth: May 17, 1950 Referring Provider:   Flowsheet Row Pulmonary Rehab from 04/12/2022 in [facility] Cardiac and Pulmonary Rehab  Referring Provider Landry Dyke MD       Encounter Date: 06/13/2022  Check In:  Session Check In - 06/13/22 0912       Check-In   Supervising physician immediately available to respond to emergencies See telemetry face sheet for immediately available ER MD    Location [facility]-Cardiac & Pulmonary Rehab    Staff Present Cora Collum, RN, BSN, CCRP;Jessica [place], MA, RCEP, CCRP, Zackery Barefoot, MS, ACSM CEP, Exercise Physiologist    Virtual Visit No    Medication changes reported     No    Fall or balance concerns reported    No    Warm-up and Cool-down Performed on first and last piece of equipment    Resistance Training Performed Yes    VAD Patient? No    PAD/SET Patient? No      Pain Assessment   Currently in Pain? No/denies                Social History   Tobacco Use  Smoking Status Former   Packs/day: 0.25   Years: 8.00   Additional pack years: 0.00   Total pack years: 2.00   Types: Cigarettes   Quit date: 03/16/1977   Years since quitting: 45.2   Passive exposure: Past  Smokeless Tobacco Former    Goals Met:  Proper associated with RPD/PD & O2 Sat Independence with exercise equipment Exercise tolerated well No report of concerns or symptoms today  Goals Unmet:  Not Applicable  Comments: Pt able to follow exercise prescription today without complaint.  Will continue to monitor for progression.    Dr. Bethann Punches is Medical Director for [facility] Cardiac Rehabilitation.  Dr. Vida Rigger is Medical Director for [facility] Pulmonary Rehabilitation.

## 2022-06-15 ENCOUNTER — Encounter: Payer: Medicare Other | Attending: Pulmonary Disease | Admitting: *Deleted

## 2022-06-15 DIAGNOSIS — D860 Sarcoidosis of lung: Secondary | ICD-10-CM | POA: Insufficient documentation

## 2022-06-15 DIAGNOSIS — D869 Sarcoidosis, unspecified: Secondary | ICD-10-CM

## 2022-06-15 NOTE — Progress Notes (Signed)
Daily Session Note  Patient Details  Name: Ethan Frye MRN: [id number] Date of Birth: 1950/11/05 Referring Provider:   Flowsheet Row Pulmonary Rehab from 04/12/2022 in [facility] Cardiac and Pulmonary Rehab  Referring Provider Landry Dyke MD       Encounter Date: 06/15/2022  Check In:  Session Check In - 06/15/22 0746       Check-In   Supervising physician immediately available to respond to emergencies See telemetry face sheet for immediately available ER MD    Location [facility]-Cardiac & Pulmonary Rehab    Staff Present Cora Collum, RN, BSN, CCRP;Laureen Manson Passey, BS, RRT, CPFT;Kara Clinton Sawyer, MS, ACSM CEP, Exercise Physiologist    Virtual Visit No    Medication changes reported     No    Fall or balance concerns reported    No    Warm-up and Cool-down Performed on first and last piece of equipment    Resistance Training Performed Yes    VAD Patient? No    PAD/SET Patient? No      Pain Assessment   Currently in Pain? No/denies                Social History   Tobacco Use  Smoking Status Former   Packs/day: 0.25   Years: 8.00   Additional pack years: 0.00   Total pack years: 2.00   Types: Cigarettes   Quit date: 03/16/1977   Years since quitting: 45.2   Passive exposure: Past  Smokeless Tobacco Former    Goals Met:  Proper associated with RPD/PD & O2 Sat Independence with exercise equipment Exercise tolerated well No report of concerns or symptoms today  Goals Unmet:  Not Applicable  Comments: Pt able to follow exercise prescription today without complaint.  Will continue to monitor for progression.    Dr. Bethann Punches is Medical Director for [facility] Cardiac Rehabilitation.  Dr. Vida Rigger is Medical Director for [facility] Pulmonary Rehabilitation.

## 2022-06-27 ENCOUNTER — Encounter: Payer: Medicare Other | Admitting: *Deleted

## 2022-06-27 DIAGNOSIS — D860 Sarcoidosis of lung: Secondary | ICD-10-CM | POA: Diagnosis not present

## 2022-06-27 DIAGNOSIS — D869 Sarcoidosis, unspecified: Secondary | ICD-10-CM

## 2022-06-27 NOTE — Progress Notes (Signed)
Daily Session Note  Patient Details  Name: Ethan Frye MRN: [id number] Date of Birth: 07-Jan-1951 Referring Provider:   Flowsheet Row Pulmonary Rehab from 04/12/2022 in [facility] Cardiac and Pulmonary Rehab  Referring Provider Landry Dyke MD       Encounter Date: 06/27/2022  Check In:  Session Check In - 06/27/22 0841       Check-In   Supervising physician immediately available to respond to emergencies See telemetry face sheet for immediately available ER MD    Location [facility]-Cardiac & Pulmonary Rehab    Staff Present Cora Collum, RN, BSN, Osa Craver, MS, ACSM CEP, Exercise Physiologist    Virtual Visit No    Medication changes reported     No    Fall or balance concerns reported    No    Warm-up and Cool-down Performed on first and last piece of equipment    Resistance Training Performed Yes    VAD Patient? No    PAD/SET Patient? No      Pain Assessment   Currently in Pain? No/denies                Social History   Tobacco Use  Smoking Status Former   Packs/day: 0.25   Years: 8.00   Additional pack years: 0.00   Total pack years: 2.00   Types: Cigarettes   Quit date: 03/16/1977   Years since quitting: 45.3   Passive exposure: Past  Smokeless Tobacco Former    Goals Met:  Proper associated with RPD/PD & O2 Sat Independence with exercise equipment Exercise tolerated well No report of concerns or symptoms today  Goals Unmet:  Not Applicable  Comments: Pt able to follow exercise prescription today without complaint.  Will continue to monitor for progression.    Dr. Bethann Punches is Medical Director for [facility] Cardiac Rehabilitation.  Dr. Vida Rigger is Medical Director for [facility] Pulmonary Rehabilitation.

## 2022-06-28 ENCOUNTER — Encounter: Payer: Self-pay | Admitting: *Deleted

## 2022-06-28 DIAGNOSIS — D869 Sarcoidosis, unspecified: Secondary | ICD-10-CM

## 2022-06-28 NOTE — Progress Notes (Signed)
Pulmonary Individual Treatment Plan  Patient Details  Name: SAMUELL KNOBLE MRN: [id number] Date of Birth: 03/08/1950 Referring Provider:   Flowsheet Row Pulmonary Rehab from 04/12/2022 in [facility] Cardiac and Pulmonary Rehab  Referring Provider Landry Dyke MD       Initial Encounter Date:  Flowsheet Row Pulmonary Rehab from 04/12/2022 in [facility] Cardiac and Pulmonary Rehab  Date 04/12/22       Visit Diagnosis: Sarcoidosis  Patient's Home Medications on Admission:  Current Outpatient Medications:    acidophilus (RISAQUAD) CAPS capsule, Take 1 capsule by mouth daily., Disp: , Rfl:    albuterol (PROVENTIL) (2.5 MG/3ML) 0.083% nebulizer solution, Take 2.5 mg by nebulization every 6 (six) hours as needed for wheezing or shortness of breath. (Patient not taking: Reported on 04/10/2022), Disp: , Rfl:    albuterol (VENTOLIN HFA) 108 (90 Base) MCG/ACT inhaler, Inhale 2 puffs into the lungs every 6 (six) hours as needed for wheezing or shortness of breath., Disp: , Rfl:    aspirin EC 81 MG tablet, Take 81 mg by mouth daily. Swallow whole., Disp: , Rfl:    aspirin EC 81 MG tablet, Take by mouth. (Patient not taking: Reported on 04/10/2022), Disp: , Rfl:    atorvastatin (LIPITOR) 20 MG tablet, Take 20 mg by mouth daily. (Patient not taking: Reported on 04/10/2022), Disp: , Rfl:    atorvastatin (LIPITOR) 40 MG tablet, Take by mouth., Disp: , Rfl:    azithromycin (ZITHROMAX) 500 MG tablet, Take 500 mg by mouth every Monday, Wednesday, and Friday. (Patient not taking: Reported on 04/10/2022), Disp: , Rfl:    Blood Glucose Monitoring Suppl (FIFTY50 GLUCOSE METER 2.0) w/Device KIT, See admin instructions., Disp: , Rfl:    budesonide (PULMICORT) 0.5 MG/2ML nebulizer solution, Inhale into the lungs., Disp: , Rfl:    carvedilol (COREG) 25 MG tablet, Take 25 mg by mouth 2 (two) times daily. (Patient not taking: Reported on 04/10/2022), Disp: , Rfl:    carvedilol (COREG) 25 MG tablet, Take 1 tablet by mouth 2  (two) times daily with a meal., Disp: , Rfl:    celecoxib (CELEBREX) 200 MG capsule, Take 200 mg by mouth daily as needed for pain. (Patient not taking: Reported on 04/10/2022), Disp: , Rfl:    celecoxib (CELEBREX) 200 MG capsule, Take by mouth., Disp: , Rfl:    cetirizine (ZYRTEC) 10 MG tablet, Take 10 mg by mouth daily., Disp: , Rfl:    cholecalciferol (VITAMIN D) 25 MCG (1000 UNIT) tablet, Take 1,000 Units by mouth daily., Disp: , Rfl:    clonazePAM (KLONOPIN) 1 MG tablet, Take 1 mg by mouth 2 (two) times daily. (Patient not taking: Reported on 04/10/2022), Disp: , Rfl:    clopidogrel (PLAVIX) 75 MG tablet, clopidogrel 75 mg tablet  TAKE 1 TABLET (75 MG TOTAL) BY MOUTH ONCE DAILY., Disp: , Rfl:    Cysteamine Bitartrate (PROCYSBI) 300 MG PACK, Use 1 each 3 (three) times daily Use as instructed., Disp: , Rfl:    diclofenac Sodium (VOLTAREN) 1 % GEL, Apply 2 g topically 4 (four) times daily. (Patient not taking: Reported on 04/10/2022), Disp: , Rfl:    diclofenac Sodium (VOLTAREN) 1 % GEL, Apply 2 g topically 4 (four) times daily., Disp: , Rfl:    doxepin (SINEQUAN) 50 MG capsule, Take 150 mg by mouth at bedtime. (Patient not taking: Reported on 04/10/2022), Disp: , Rfl:    doxepin (SINEQUAN) 50 MG capsule, Take by mouth., Disp: , Rfl:    DULoxetine (CYMBALTA) 60 MG  capsule, Take 60 mg by mouth daily. (Patient not taking: Reported on 04/10/2022), Disp: , Rfl:    DULoxetine (CYMBALTA) 60 MG capsule, Take 1 tablet by mouth daily., Disp: , Rfl:    ferrous sulfate 325 (65 FE) MG tablet, Take 325 mg by mouth daily with breakfast., Disp: , Rfl:    fluticasone-salmeterol (ADVAIR) 500-50 MCG/ACT AEPB, Inhale 1 puff into the lungs every 12 (twelve) hours., Disp: , Rfl:    glucose blood (PRECISION QID TEST) test strip, Use 1 each (1 strip total) once daily Use as instructed., Disp: , Rfl:    HYDROcodone-acetaminophen (NORCO/VICODIN) 5-325 MG tablet, Take 1 tablet by mouth every 4 (four) hours as needed for pain.,  Disp: , Rfl:    ketoconazole (NIZORAL) 2 % cream, ketoconazole 2 % topical cream  APPLY TWICE DAILY TO RASH ON FACE AND GROIN, Disp: , Rfl:    lisinopril (ZESTRIL) 40 MG tablet, Take 40 mg by mouth daily., Disp: , Rfl:    losartan-hydrochlorothiazide (HYZAAR) 100-25 MG tablet, Take 1 tablet by mouth daily., Disp: , Rfl:    metFORMIN (GLUCOPHAGE) 500 MG tablet, Take 500-1,000 mg by mouth 2 (two) times daily with a meal. (Patient not taking: Reported on 04/10/2022), Disp: , Rfl:    metFORMIN (GLUCOPHAGE) 500 MG tablet, Take by mouth., Disp: , Rfl:    methadone (DOLOPHINE) 5 MG tablet, Take 2.5-5 mg by mouth 2 (two) times daily. Take 2.5 mg (one-half tablet) twice daily for one week then increase to 5 mg (one tablet) twice daily, thereafter, Disp: , Rfl:    montelukast (SINGULAIR) 10 MG tablet, Take 10 mg by mouth daily. (Patient not taking: Reported on 04/10/2022), Disp: , Rfl:    montelukast (SINGULAIR) 10 MG tablet, Take 1 tablet by mouth daily., Disp: , Rfl:    moxifloxacin (VIGAMOX) 0.5 % ophthalmic solution, moxifloxacin 0.5 % eye drops  PUT 1 DROP IN RIGHT EYE 4 TIMES A DAY FOR 7 DAYS OR AS DIRECTED, Disp: , Rfl:    Multiple Vitamins-Minerals (MULTIVITAMIN WITH MINERALS) tablet, Take 1 tablet by mouth daily., Disp: , Rfl:    naloxone (NARCAN) nasal spray 4 mg/0.1 mL, Place into the nose., Disp: , Rfl:    nitroGLYCERIN (NITROSTAT) 0.4 MG SL tablet, nitroglycerin 0.4 mg sublingual tablet  PLACE 1 TABLET UNDER THE TONGUE EVERY FOR CHEST PAIN, MAY TAKE UP TO 3 DOSES, Disp: , Rfl:    nystatin (MYCOSTATIN) 100000 UNIT/ML suspension, Take 2 mLs by mouth in the morning, at noon, in the evening, and at bedtime. (Patient not taking: Reported on 04/10/2022), Disp: , Rfl:    nystatin (MYCOSTATIN) 100000 UNIT/ML suspension, nystatin 100,000 unit/mL oral suspension  SWISH AND SWALLOW 5 MLS 4 (FOUR) TIMES DAILY., Disp: , Rfl:    omeprazole (PRILOSEC) 40 MG capsule, Take 40 mg by mouth daily. (Patient not  taking: Reported on 04/10/2022), Disp: , Rfl:    omeprazole (PRILOSEC) 40 MG capsule, Take 1 tablet by mouth daily., Disp: , Rfl:    oxyCODONE (OXY IR/ROXICODONE) 5 MG immediate release tablet, Take by mouth., Disp: , Rfl:    pimecrolimus (ELIDEL) 1 % cream, Apply 1 application topically 2 (two) times daily., Disp: , Rfl:    pimecrolimus (ELIDEL) 1 % cream, Elidel 1 % topical cream  APPLY TWICE DAILY TO AFFECTED AREA(S) FOR 1-2 WEEKS (Patient not taking: Reported on 04/10/2022), Disp: , Rfl:    polyethylene glycol (MIRALAX / GLYCOLAX) 17 g packet, Take 17 g by mouth 2 (two) times daily. (Patient  not taking: Reported on 04/10/2022), Disp: , Rfl:    polyethylene glycol powder (GLYCOLAX/MIRALAX) 17 GM/SCOOP powder, Take by mouth., Disp: , Rfl:    pregabalin (LYRICA) 75 MG capsule, Take 75 mg by mouth 3 (three) times daily., Disp: , Rfl:    Semaglutide,0.25 or 0.5MG /DOS, (OZEMPIC, 0.25 OR 0.5 MG/DOSE,) 2 MG/3ML SOPN, Inject into the skin., Disp: , Rfl:    senna-docusate (SENOKOT-S) 8.6-50 MG tablet, Take 1 tablet by mouth daily. (Patient not taking: Reported on 04/10/2022), Disp: , Rfl:    senna-docusate (SENOKOT-S) 8.6-50 MG tablet, Take 2 tablets by mouth daily., Disp: , Rfl:    spironolactone (ALDACTONE) 25 MG tablet, Take 12.5 mg by mouth daily., Disp: , Rfl:    SUMAtriptan (IMITREX) 50 MG tablet, Take 50 mg by mouth as directed., Disp: , Rfl:    tiZANidine (ZANAFLEX) 4 MG tablet, Take 4 mg by mouth 3 (three) times daily., Disp: , Rfl:    traMADol (ULTRAM) 50 MG tablet, Take 50 mg by mouth every 6 (six) hours as needed for pain., Disp: , Rfl:    vitamin B-12 (CYANOCOBALAMIN) 1000 MCG tablet, Take 1,000 mcg by mouth daily., Disp: , Rfl:   Past Medical History: No past medical history on file.  Tobacco Use: Social History   Tobacco Use  Smoking Status Former   Packs/day: 0.25   Years: 8.00   Additional pack years: 0.00   Total pack years: 2.00   Types: Cigarettes   Quit date: 03/16/1977    Years since quitting: 45.3   Passive exposure: Past  Smokeless Tobacco Former    Labs: Investment banker, operational       Latest Ref Rng & Units 11/04/2020 11/05/2020  Labs for ITP Cardiac and Pulmonary Rehab  Hemoglobin A1c 4.8 - 5.6 % - 6.2   PH, Arterial 7.350 - 7.450 7.24  7.34   PCO2 arterial 32.0 - 48.0 mmHg 44  37   Bicarbonate 20.0 - 28.0 mmol/L 20.2  18.9  20.0   Acid-base deficit 0.0 - 2.0 mmol/L 6.9  8.2  5.2   O2 Saturation % 75.8  89.5  99.2      Pulmonary Assessment Scores:  Pulmonary Assessment Scores     Row Name 04/12/22 1259         ADL UCSD   ADL Phase Entry     SOB Score total 91     Rest 3     Walk 4     Stairs 5     Bath 2     Dress 3     Shop 4       CAT Score   CAT Score 32       mMRC Score   mMRC Score 4              UCSD: Self-administered rating of dyspnea associated with activities of daily living (ADLs) 6-point scale (0 = "not at all" to 5 = "maximal or unable to do because of breathlessness")  Scoring Scores range from 0 to 120.  Minimally important difference is 5 units  CAT: CAT can identify the health impairment of COPD patients and is better correlated with disease progression.  CAT has a scoring range of zero to 40. The CAT score is classified into four groups of low (less than 10), medium (10 - 20), high (21-30) and very high (31-40) based on the impact level of disease on health status. A CAT score over 10 suggests significant symptoms.  A worsening CAT score  could be explained by an exacerbation, poor medication adherence, poor inhaler technique, or progression of COPD or comorbid conditions.  CAT MCID is 2 points  mMRC: mMRC (Modified Medical Research Council) Dyspnea Scale is used to assess the degree of baseline functional disability in patients of respiratory disease due to dyspnea. No minimal important difference is established. A decrease in score of 1 point or greater is considered a positive change.   Pulmonary Function  Assessment:  Pulmonary Function Assessment - 04/12/22 1259       Breath   Shortness of Breath Yes;Limiting activity;Panic with Shortness of Breath             Exercise Target Goals: Exercise Program Goal: Individual exercise prescription set using results from initial 6 min walk test and THRR while considering  patient's activity barriers and safety.   Exercise Prescription Goal: Initial exercise prescription builds to 30-45 minutes a day of aerobic activity, 2-3 days per week.  Home exercise guidelines will be given to patient during program as part of exercise prescription that the participant will acknowledge.  Education: Aerobic Exercise: - Group verbal and visual presentation on the components of exercise prescription. Introduces F.I.T.T principle from ACSM for exercise prescriptions.  Reviews F.I.T.T. principles of aerobic exercise including progression. Written material given at graduation. Flowsheet Row Pulmonary Rehab from 06/15/2022 in [facility] Cardiac and Pulmonary Rehab  Education need identified 04/12/22  Date 05/11/22  Educator [facility]  Instruction Review Code 1- Verbalizes Understanding       Education: Resistance Exercise: - Group verbal and visual presentation on the components of exercise prescription. Introduces F.I.T.T principle from ACSM for exercise prescriptions  Reviews F.I.T.T. principles of resistance exercise including progression. Written material given at graduation. Flowsheet Row Pulmonary Rehab from 06/15/2022 in [facility] Cardiac and Pulmonary Rehab  Date 05/18/22  Educator NT  Instruction Review Code 1- Verbalizes Understanding        Education: Exercise & Equipment Safety: - Individual verbal instruction and demonstration of equipment use and safety with use of the equipment. Flowsheet Row Pulmonary Rehab from 06/15/2022 in [facility] Cardiac and Pulmonary Rehab  Date 04/10/22  Educator [facility]  Instruction Review Code 1- Verbalizes Understanding       Education:  Exercise Physiology & General Exercise Guidelines: - Group verbal and written instruction with models to review the exercise physiology of the cardiovascular system and associated critical values. Provides general exercise guidelines with specific guidelines to those with heart or lung disease.    Education: Flexibility, Balance, Mind/Body Relaxation: - Group verbal and visual presentation with interactive activity on the components of exercise prescription. Introduces F.I.T.T principle from ACSM for exercise prescriptions. Reviews F.I.T.T. principles of flexibility and balance exercise training including progression. Also discusses the mind body connection.  Reviews various relaxation techniques to help reduce and manage stress (i.e. Deep breathing, progressive muscle relaxation, and visualization). Balance handout provided to take home. Written material given at graduation. Flowsheet Row Pulmonary Rehab from 06/15/2022 in [facility] Cardiac and Pulmonary Rehab  Date 05/18/22  Educator NT  Instruction Review Code 1- Verbalizes Understanding       Activity Barriers & Risk Stratification:  Activity Barriers & Cardiac Risk Stratification - 04/12/22 1154       Activity Barriers & Cardiac Risk Stratification   Activity Barriers Arthritis;Fibromyalgia;Joint Problems;Shortness of Breath;Muscular Weakness;Deconditioning;Balance Concerns;History of Falls;Assistive Device   chronic pain from fibromyalgia and RA in hands/shoulders/knees, fell at home on Sunday bruised left side  6 Minute Walk:  6 Minute Walk     Row Name 04/12/22 1141         6 Minute Walk   Phase Initial     Distance 840 feet     Walk Time 6 minutes     # of Rest Breaks 0     MPH 1.59     METS 1.86     RPE 17     Perceived Dyspnea  3     VO2 Peak 6.51     Symptoms Yes (comment)     Comments SOB, chronic hip pain     Resting HR 78 bpm     Resting BP 118/68     Resting Oxygen Saturation  94 %     Exercise  Oxygen Saturation  during 6 min walk 89 %     Max Ex. HR 92 bpm     Max Ex. BP 146/74     2 Minute Post BP 136/72       Interval HR   1 Minute HR 86     2 Minute HR 91     3 Minute HR 90     4 Minute HR 91     5 Minute HR 92     6 Minute HR 92     2 Minute Post HR 78     Interval Heart Rate? Yes       Interval Oxygen   Interval Oxygen? Yes     Baseline Oxygen Saturation % 94 %     1 Minute Oxygen Saturation % 90 %     1 Minute Liters of Oxygen 0 L  Room Air     2 Minute Oxygen Saturation % 90 %     2 Minute Liters of Oxygen 0 L     3 Minute Oxygen Saturation % 89 %     3 Minute Liters of Oxygen 0 L     4 Minute Oxygen Saturation % 89 %     4 Minute Liters of Oxygen 0 L     5 Minute Oxygen Saturation % 90 %     5 Minute Liters of Oxygen 0 L     6 Minute Oxygen Saturation % 90 %     6 Minute Liters of Oxygen 0 L     2 Minute Post Oxygen Saturation % 94 %     2 Minute Post Liters of Oxygen 0 L             Oxygen Initial Assessment:  Oxygen Initial Assessment - 06/06/22 0803       Home Oxygen   Home Oxygen Device Home Concentrator    Sleep Oxygen Prescription Continuous;CPAP   new sleep study completed, waiting on results   Home Exercise Oxygen Prescription Continuous   prn   Liters per minute 2    Home Resting Oxygen Prescription Continuous   prn   Liters per minute 2    Compliance with Home Oxygen Use Yes   only prn     Program Oxygen Prescription   Program Oxygen Prescription None   Can use O2 prn     Intervention   Short Term Goals To learn and exhibit compliance with exercise, home and travel O2 prescription;To learn and understand importance of monitoring SPO2 with pulse oximeter and demonstrate accurate use of the pulse oximeter.;To learn and understand importance of maintaining oxygen saturations>88%;To learn and demonstrate proper pursed lip breathing techniques or other breathing techniques. ;  To learn and demonstrate proper use of respiratory medications     Long  Term Goals Verbalizes importance of monitoring SPO2 with pulse oximeter and return demonstration;Exhibits proper breathing techniques, such as pursed lip breathing or other method taught during program session;Demonstrates proper use of MDI's;Compliance with respiratory medication;Maintenance of O2 saturations>88%;Exhibits compliance with exercise, home  and travel O2 prescription             Oxygen Re-Evaluation:  Oxygen Re-Evaluation     Row Name 04/25/22 0840 05/23/22 0756 06/06/22 0803         Program Oxygen Prescription   Program Oxygen Prescription None None --       Home Oxygen   Home Oxygen Device Home Concentrator Home Concentrator --     Sleep Oxygen Prescription Continuous;CPAP Continuous;CPAP --     Liters per minute 0 0 --     Home Exercise Oxygen Prescription -- Continuous --     Liters per minute 2 2 --     Home Resting Oxygen Prescription Continuous Continuous --     Liters per minute 2 2 --     Compliance with Home Oxygen Use -- No --       Goals/Expected Outcomes   Short Term Goals To learn and demonstrate proper pursed lip breathing techniques or other breathing techniques.  To learn and exhibit compliance with exercise, home and travel O2 prescription;To learn and understand importance of monitoring SPO2 with pulse oximeter and demonstrate accurate use of the pulse oximeter.;To learn and understand importance of maintaining oxygen saturations>88%;To learn and demonstrate proper pursed lip breathing techniques or other breathing techniques. ;To learn and demonstrate proper use of respiratory medications --     Long  Term Goals -- Verbalizes importance of monitoring SPO2 with pulse oximeter and return demonstration;Exhibits proper breathing techniques, such as pursed lip breathing or other method taught during program session;Demonstrates proper use of MDI's;Compliance with respiratory medication;Maintenance of O2 saturations>88%;Exhibits compliance with  exercise, home  and travel O2 prescription --     Comments Reviewed PLB technique with pt.  Talked about how it works and it's importance in maintaining their exercise saturations. Al is scheduled for a sleep study as his equipment has not been working well for him and is out of date. He tried to use his CPAP but it was putting too much pressure on his teeth and nose.  He has been wearing his oxygen at home.  His saturations are doing pretty good overall.  Walking will still drop him on occassion.  He is doing well with his PLB. Al continues to do well. He just had a completed a sleep study this past Sunday and is awaiting the results. He is supposed to be getting a new mask this will fit him better with his CPAP. His oxygen saturations at rehab have been staying above 88% on RA. He continues to check them at home and uses oxygen as needed. Continues to use PLB.     Goals/Expected Outcomes Short: Become more profiecient at using PLB. Long: Become independent at using PLB. Short; Get sleep study done Long; Continue to use PLB Short: Get results from sleep study and follow doctor orders on CPAP Long: Continue to monitor O2 and use PLB long-term              Oxygen Discharge (Final Oxygen Re-Evaluation):  Oxygen Re-Evaluation - 06/06/22 0803       Goals/Expected Outcomes   Comments Al continues to do well. He just  had a completed a sleep study this past Sunday and is awaiting the results. He is supposed to be getting a new mask this will fit him better with his CPAP. His oxygen saturations at rehab have been staying above 88% on RA. He continues to check them at home and uses oxygen as needed. Continues to use PLB.    Goals/Expected Outcomes Short: Get results from sleep study and follow doctor orders on CPAP Long: Continue to monitor O2 and use PLB long-term             Initial Exercise Prescription:  Initial Exercise Prescription - 04/12/22 1100       Date of Initial Exercise RX and  Referring Provider   Date 04/12/22    Referring Provider Landry Dyke MD      Oxygen   Oxygen --   may need oxygen is desaturates with exercise   Maintain Oxygen Saturation 88% or higher      Recumbant Bike   Level 1    RPM 50    Watts 10    Minutes 15    METs 1.8      T5 Nustep   Level 1    SPM 80    Minutes 15    METs 1.8      Biostep-RELP   Level 1    SPM 50    Minutes 15    METs 2      Track   Laps 21    Minutes 15    METs 2.14      Prescription Details   Frequency (times per week) 2    Duration Progress to 30 minutes of continuous aerobic without signs/symptoms of physical distress      Intensity   THRR 40-80% of Max Heartrate 106-135    Ratings of Perceived Exertion 11-13    Perceived Dyspnea 0-4      Progression   Progression Continue to progress workloads to maintain intensity without signs/symptoms of physical distress.      Resistance Training   Training Prescription Yes    Weight 3 lb    Reps 10-15             Perform Capillary Blood Glucose checks as needed.  Exercise Prescription Changes:   Exercise Prescription Changes     Row Name 04/12/22 1100 05/04/22 1400 05/18/22 1200 06/01/22 0800 06/01/22 1400     Response to Exercise   Blood Pressure (Admit) 118/68 126/70 104/66 -- 142/70   Blood Pressure (Exercise) 146/74 140/76 -- -- 136/76   Blood Pressure (Exit) 142/60 130/74 112/62 -- 124/62   Heart Rate (Admit) 78 bpm 88 bpm 72 bpm -- 72 bpm   Heart Rate (Exercise) 92 bpm 87 bpm 93 bpm -- 104 bpm   Heart Rate (Exit) 74 bpm 85 bpm 84 bpm -- 85 bpm   Oxygen Saturation (Admit) 94 % 90 % 96 % -- 94 %   Oxygen Saturation (Exercise) 89 % 92 % 91 % -- 90 %   Oxygen Saturation (Exit) 94 % 95 % 95 % -- 97 %   Rating of Perceived Exertion (Exercise) 17 15 16  -- 15   Perceived Dyspnea (Exercise) 3 3 3  -- 3   Symptoms SOB, hip pain none SOB -- SOB, dizziness   Comments walk test results First two days of exercise -- -- --   Duration --  Progress to 30 minutes of  aerobic without signs/symptoms of physical distress Progress to 30 minutes of  aerobic without signs/symptoms of physical distress -- Progress to 30 minutes of  aerobic without signs/symptoms of physical distress   Intensity -- THRR unchanged THRR unchanged -- THRR unchanged     Progression   Progression -- Continue to progress workloads to maintain intensity without signs/symptoms of physical distress. Continue to progress workloads to maintain intensity without signs/symptoms of physical distress. -- Continue to progress workloads to maintain intensity without signs/symptoms of physical distress.   Average METs -- 1.86 1.95 -- 1.67     Resistance Training   Training Prescription -- Yes Yes -- Yes   Weight -- 3 lb 3 lb -- 3 lb   Reps -- 10-15 10-15 -- 10-15     Interval Training   Interval Training -- No No -- No     Oxygen   Oxygen -- -- --  prn -- --     Recumbant Bike   Level -- 1 -- -- --   Watts -- 10 -- -- --   Minutes -- 15 -- -- --   METs -- 2.32 -- -- --     NuStep   Level -- 1 1 -- 1   Minutes -- 30 30 -- 15   METs -- 1.8 2 -- --     Biostep-RELP   Level -- -- 1 -- 2   Minutes -- -- 15 -- 30   METs -- -- 2 -- 2     Track   Laps -- -- 2  trying out to start -- 2  dizzy   Minutes -- -- 15 -- 15   METs -- -- 1 -- 1     Home Exercise Plan   Plans to continue exercise at -- -- -- Home (comment)  walking,  recumbent bike Home (comment)  walking,  recumbent bike   Frequency -- -- -- Add 3 additional days to program exercise sessions. Add 3 additional days to program exercise sessions.   Initial Home Exercises Provided -- -- -- 06/01/22 06/01/22     Oxygen   Maintain Oxygen Saturation -- 88% or higher 88% or higher -- 88% or higher    Row Name 06/13/22 1500 06/28/22 0700           Response to Exercise   Blood Pressure (Admit) 124/70 126/62      Blood Pressure (Exit) 120/68 130/80      Heart Rate (Admit) 78 bpm 71 bpm      Heart  Rate (Exercise) 92 bpm 87 bpm      Heart Rate (Exit) 81 bpm 83 bpm      Oxygen Saturation (Admit) 95 % 96 %      Oxygen Saturation (Exercise) 93 % 93 %      Oxygen Saturation (Exit) 96 % 95 %      Rating of Perceived Exertion (Exercise) 15 15      Perceived Dyspnea (Exercise) 2 2      Symptoms SOB SOB      Duration Continue with 30 min of aerobic exercise without signs/symptoms of physical distress. Continue with 30 min of aerobic exercise without signs/symptoms of physical distress.      Intensity THRR unchanged THRR unchanged        Progression   Progression Continue to progress workloads to maintain intensity without signs/symptoms of physical distress. Continue to progress workloads to maintain intensity without signs/symptoms of physical distress.      Average METs 2 2.18        Resistance  Training   Training Prescription Yes Yes      Weight 3 lb 3 lb      Reps 10-15 10-15        Interval Training   Interval Training No No        NuStep   Level 1 3      Minutes 30 30      METs 2.9 2.5        Biostep-RELP   Level 2 2      Minutes 30 30      METs 2 2        Home Exercise Plan   Plans to continue exercise at Home (comment)  walking,  recumbent bike Home (comment)  walking,  recumbent bike      Frequency Add 3 additional days to program exercise sessions. Add 3 additional days to program exercise sessions.      Initial Home Exercises Provided 06/01/22 06/01/22        Oxygen   Maintain Oxygen Saturation 88% or higher 88% or higher               Exercise Comments:   Exercise Goals and Review:   Exercise Goals     Row Name 04/12/22 1255             Exercise Goals   Increase Physical Activity Yes       Intervention Provide advice, education, support and counseling about physical activity/exercise needs.;Develop an individualized exercise prescription for aerobic and resistive training based on initial evaluation findings, risk stratification, comorbidities  and participant's personal goals.       Expected Outcomes Short Term: Attend rehab on a regular basis to increase amount of physical activity.;Long Term: Add in home exercise to make exercise part of routine and to increase amount of physical activity.;Long Term: Exercising regularly at least 3-5 days a week.       Increase Strength and Stamina Yes       Intervention Provide advice, education, support and counseling about physical activity/exercise needs.;Develop an individualized exercise prescription for aerobic and resistive training based on initial evaluation findings, risk stratification, comorbidities and participant's personal goals.       Expected Outcomes Short Term: Increase workloads from initial exercise prescription for resistance, speed, and METs.;Short Term: Perform resistance training exercises routinely during rehab and add in resistance training at home;Long Term: Improve cardiorespiratory fitness, muscular endurance and strength as measured by increased METs and functional capacity ( )       Able to understand and use rate of perceived exertion (RPE) scale Yes       Intervention Provide education and explanation on how to use RPE scale       Expected Outcomes Short Term: Able to use RPE daily in rehab to express subjective intensity level;Long Term:  Able to use RPE to guide intensity level when exercising independently       Able to understand and use Dyspnea scale Yes       Intervention Provide education and explanation on how to use Dyspnea scale       Expected Outcomes Short Term: Able to use Dyspnea scale daily in rehab to express subjective sense of shortness of breath during exertion;Long Term: Able to use Dyspnea scale to guide intensity level when exercising independently       Knowledge and understanding of Target Heart Rate Range (THRR) Yes       Intervention Provide education and explanation of THRR including how the numbers  were predicted and where they are located for  reference       Expected Outcomes Short Term: Able to state/look up THRR;Short Term: Able to use daily as guideline for intensity in rehab;Long Term: Able to use THRR to govern intensity when exercising independently       Able to check pulse independently Yes       Intervention Provide education and demonstration on how to check pulse in carotid and radial arteries.;Review the importance of being able to check your own pulse for safety during independent exercise       Expected Outcomes Short Term: Able to explain why pulse checking is important during independent exercise;Long Term: Able to check pulse independently and accurately       Understanding of Exercise Prescription Yes       Intervention Provide education, explanation, and written materials on patient's individual exercise prescription       Expected Outcomes Short Term: Able to explain program exercise prescription;Long Term: Able to explain home exercise prescription to exercise independently                Exercise Goals Re-Evaluation :  Exercise Goals Re-Evaluation     Row Name 04/25/22 0842 05/04/22 1433 05/18/22 1209 05/23/22 0749 06/01/22 0819     Exercise Goal Re-Evaluation   Exercise Goals Review Understanding of Exercise Prescription;Knowledge and understanding of Target Heart Rate Range (THRR);Able to understand and use Dyspnea scale;Able to understand and use rate of perceived exertion (RPE) scale Increase Physical Activity;Increase Strength and Stamina;Understanding of Exercise Prescription Increase Physical Activity;Increase Strength and Stamina;Understanding of Exercise Prescription Increase Physical Activity;Increase Strength and Stamina;Understanding of Exercise Prescription Increase Physical Activity;Increase Strength and Stamina;Understanding of Exercise Prescription;Able to understand and use rate of perceived exertion (RPE) scale;Able to understand and use Dyspnea scale;Able to check pulse independently;Knowledge  and understanding of Target Heart Rate Range (THRR)   Comments Reviewed RPE scale, THR and program prescription with pt today.  Pt voiced understanding and was given a copy of goals to take home. Rhythm is off to a good start in the program. He had an overall average MET level of 1.86 METs during his first two days of rehab. He also was able to tolerate 30 minutes on the T4 nustep at level 1. He tried to use the recumbent bike but had to switch back to the T4 nustep due to discomfort. We will continue to monitor his progress in the program. Al missed a couple sessions last review as he out town. He has been staying on the T4 Nustep as he is limited on fitting on machines. He also is limited by his breathing when walking. He tried out walking a couple laps on the track, we hope to that improve over time. His oxygen has been stayinh above 88%. We will continue to monitor. Al is doing well in rehab.  He has been using his bike/seated elliptical at home.  He will usually go for about 15-20 min.  We talked about continuing to build up his time to 30 min.  He is trying to get there.  He does feel like his stamina is starting to get better. Reviewed home exercise with pt today.  Pt plans to walk and use recumbent bike at home for exercise.  Reviewed THR, pulse, RPE, sign and symptoms, pulse oximetery and when to call 911 or MD.  Also discussed weather considerations and indoor options.  Pt voiced understanding.   Expected Outcomes Short: Use RPE daily to  regulate intensity. Long: Follow program prescription in THR. Short: Continue to follow current exercise prescription. Long: Continue to improve strength and stamina. Short: Slowly add in more laps on track Long: Continue to increase overall MET level and stamina Short: Continue to add time in on bike at home Long: Conitnue to improve stamina Short: Start to add in exercise more at home Long: Continue to improve stamina    Row Name 06/01/22 1413 06/06/22 0757  06/13/22 1559 06/28/22 0737       Exercise Goal Re-Evaluation   Exercise Goals Review Increase Physical Activity;Increase Strength and Stamina;Understanding of Exercise Prescription Increase Physical Activity;Increase Strength and Stamina;Understanding of Exercise Prescription Increase Physical Activity;Increase Strength and Stamina;Understanding of Exercise Prescription Increase Physical Activity;Increase Strength and Stamina;Understanding of Exercise Prescription    Comments Izaias is doing well in rehab. He has continued to try and walk the track but has been limited due to dizziness. He was able to improve to level 2 on the biostep and tolerated it for 30 minutes. He has continued to use 3 lb hand weights for resistance training as well. We will continue to monitor his progress in the program. Al states he has been using his recumbent bike for exercise. He uses it for about 15 minutes at a time, we talked about slowly accumulating his time up, and taking breaks when needed to. He practices PLB when he needs to and checks his O2, he states his O2 stays above 88%.  He continues to check his HR. He is using light therapy to help with neuropathy 2x/day for 90 days. He has seen an improvement so far and is hoping it may help improve his blaance and walking. Al continues to come to rehab, has missed a couple sessions due to transportation issues. He usually walks to and from rehab and stays on seated machines while at rehab. He has been working at level 2 on [employer] but continues to exercise at level 1 on the T4 Nustep. He would benefit from also increasing that to level 2. He is still limited by his SOB when exercising, with an average RPE of 15. We will continue to monitor. Al is doing well in rehab, although he has only attended rehab twic since the last review. He increased his overall average MET level to 2.18 METs. He also improved to level 3 on the T4 nustep and continued to work at level 2 on the  biostep. His O2 saturations have stayed above 93% during exercise since the last review as well. We will continue to monitor his progress in the program.    Expected Outcomes Short: Continue to push for more laps on the track. Long: Continue to improve strength and stamina. Short: Slowly build up tolerance on recumbent bike, monitor HR and O2 during Long: Continue independent exercise at home at appropriate prescription Short: Increase to level 2 on the Nustep Long: Continue to increase overall MET level and stamina Short: Continue to progressively increase workloads. Long: Continue to increase strength and stamina.             Discharge Exercise Prescription (Final Exercise Prescription Changes):  Exercise Prescription Changes - 06/28/22 0700       Response to Exercise   Blood Pressure (Admit) 126/62    Blood Pressure (Exit) 130/80    Heart Rate (Admit) 71 bpm    Heart Rate (Exercise) 87 bpm    Heart Rate (Exit) 83 bpm    Oxygen Saturation (Admit) 96 %  Oxygen Saturation (Exercise) 93 %    Oxygen Saturation (Exit) 95 %    Rating of Perceived Exertion (Exercise) 15    Perceived Dyspnea (Exercise) 2    Symptoms SOB    Duration Continue with 30 min of aerobic exercise without signs/symptoms of physical distress.    Intensity THRR unchanged      Progression   Progression Continue to progress workloads to maintain intensity without signs/symptoms of physical distress.    Average METs 2.18      Resistance Training   Training Prescription Yes    Weight 3 lb    Reps 10-15      Interval Training   Interval Training No      NuStep   Level 3    Minutes 30    METs 2.5      Biostep-RELP   Level 2    Minutes 30    METs 2      Home Exercise Plan   Plans to continue exercise at Home (comment)   walking,  recumbent bike   Frequency Add 3 additional days to program exercise sessions.    Initial Home Exercises Provided 06/01/22      Oxygen   Maintain Oxygen Saturation 88% or  higher             Nutrition:  Target Goals: Understanding of nutrition guidelines, daily intake of sodium 1500mg , cholesterol 200mg , calories 30% from fat and 7% or less from saturated fats, daily to have 5 or more servings of fruits and vegetables.  Education: All About Nutrition: -Group instruction provided by verbal, written material, interactive activities, discussions, models, and posters to present general guidelines for heart healthy nutrition including fat, fiber, MyPlate, the role of sodium in heart healthy nutrition, utilization of the nutrition label, and utilization of this knowledge for meal planning. Follow up email sent as well. Written material given at graduation. Flowsheet Row Pulmonary Rehab from 06/15/2022 in [facility] Cardiac and Pulmonary Rehab  Education need identified 04/12/22       Biometrics:  Pre Biometrics - 04/12/22 1256       Pre Biometrics   Height 5\' 10"  (1.778 m)    Weight 219 lb 11.2 oz (99.7 kg)    Waist Circumference 41 inches    Hip Circumference 40 inches    Waist to Hip Ratio 1.03 %    BMI (Calculated) 31.52    Single Leg Stand 1.4 seconds              Nutrition Therapy Plan and Nutrition Goals:  Nutrition Therapy & Goals - 06/06/22 0825       Nutrition Therapy   RD appointment deferred Yes             Nutrition Assessments:  MEDIFICTS Score Key: ?70 Need to make dietary changes  40-70 Heart Healthy Diet ? 40 Therapeutic Level Cholesterol Diet  Flowsheet Row Pulmonary Rehab from 04/12/2022 in [facility] Cardiac and Pulmonary Rehab  Picture Your Plate Total Score on Admission 44      Picture Your Plate Scores: <78 Unhealthy dietary pattern with much room for improvement. 41-50 Dietary pattern unlikely to meet recommendations for good health and room for improvement. 51-60 More healthful dietary pattern, with some room for improvement.  >60 Healthy dietary pattern, although there may be some specific behaviors that could  be improved.   Nutrition Goals Re-Evaluation:  Nutrition Goals Re-Evaluation     Row Name 05/23/22 [phone] 06/06/22 [id number]  Goals   Nutrition Goal Work on mechanical eating --      Comment Al says that he is not hungry often but is eating some.  We talked about getting in at least two meals a day.  We talked about using meal replacement bars/shakes to help.  We talked about making sure he gets in enough calories each day. Patient has deferred talking to a RD at this time. He is working on trying to eat several smaller meals/ day and eating enough calories and macronutrients to meet his needs.      Expected Outcome Short: Try meal replacement options Long: conitnue to improve diet Short: Continue to work on [employer] Long: Continue to eat pulmonary healthy based diet               Nutrition Goals Discharge (Final Nutrition Goals Re-Evaluation):  Nutrition Goals Re-Evaluation - 06/06/22 0811       Goals   Comment Patient has deferred talking to a RD at this time. He is working on trying to eat several smaller meals/ day and eating enough calories and macronutrients to meet his needs.    Expected Outcome Short: Continue to work on [employer] Long: Continue to eat pulmonary healthy based diet             Psychosocial: Target Goals: Acknowledge presence or absence of significant depression and/or stress, maximize coping skills, provide positive support system. Participant is able to verbalize types and ability to use techniques and skills needed for reducing stress and depression.   Education: Stress, Anxiety, and Depression - Group verbal and visual presentation to define topics covered.  Reviews how body is impacted by stress, anxiety, and depression.  Also discusses healthy ways to reduce stress and to treat/manage anxiety and depression.  Written material given at graduation. Flowsheet Row Pulmonary Rehab from 06/15/2022 in [facility] Cardiac and Pulmonary  Rehab  Date 04/27/22  Educator [facility]  Instruction Review Code 1- [employer] Understanding       Education: Sleep Hygiene -Provides group verbal and written instruction about how sleep can affect your health.  Define sleep hygiene, discuss sleep cycles and impact of sleep habits. Review good sleep hygiene tips.    Initial Review & Psychosocial Screening:  Initial Psych Review & Screening - 04/10/22 0956       Initial Review   Current issues with Current Anxiety/Panic;History of Depression;Current Psychotropic Meds;Current Sleep Concerns      Family Dynamics   Good Support System? Yes    Comments Jacian states that his health and pain levels are the source of his depression and anxiety. He states he feels bad all the time. Al can look to his wife, son and other family for support.      Barriers   Psychosocial barriers to participate in program The patient should benefit from training in stress management and relaxation.      Screening Interventions   Interventions Encouraged to exercise;To provide support and resources with identified psychosocial needs;Provide feedback about the scores to participant    Expected Outcomes Short Term goal: Utilizing psychosocial counselor, staff and physician to assist with identification of specific Stressors or current issues interfering with healing process. Setting desired goal for each stressor or current issue identified.;Long Term Goal: Stressors or current issues are controlled or eliminated.;Short Term goal: Identification and review with participant of any Quality of Life or Depression concerns found by scoring the questionnaire.;Long Term goal: The participant improves quality of Life and PHQ9 Scores  as seen by post scores and/or verbalization of changes             Quality of Life Scores:  Scores of 19 and below usually indicate a poorer quality of life in these areas.  A difference of  2-3 points is a clinically meaningful difference.   A difference of 2-3 points in the total score of the Quality of Life Index has been associated with significant improvement in overall quality of life, self-image, physical symptoms, and general health in studies assessing change in quality of life.  PHQ-9: Review Flowsheet       05/11/2022 04/12/2022  Depression screen PHQ 2/9  Decreased Interest 1 2  Down, Depressed, Hopeless 1 1  PHQ - 2 Score 2 3  Altered sleeping 3 2  Tired, decreased energy 3 2  Change in appetite 3 2  Feeling bad or failure about yourself  1 0  Trouble concentrating 0 0  Moving slowly or fidgety/restless 0 1  Suicidal thoughts 0 0  PHQ-9 Score 12 10  Difficult doing work/chores Somewhat difficult Somewhat difficult   Interpretation of Total Score  Total Score Depression Severity:  1-4 = Minimal depression, 5-9 = Mild depression, 10-14 = Moderate depression, 15-19 = Moderately severe depression, 20-27 = Severe depression   Psychosocial Evaluation and Intervention:  Psychosocial Evaluation - 04/10/22 0958       Psychosocial Evaluation & Interventions   Interventions Encouraged to exercise with the program and follow exercise prescription;Relaxation education;Stress management education    Comments Jaelon states that his health and pain levels are the source of his depression and anxiety. He states he feels bad all the time. Al can look to his wife, son and other family for support.    Expected Outcomes Short: Start [facility] to help with mood. Long: Maintain a healthy mental state.    Continue Psychosocial Services  Follow up required by staff             Psychosocial Re-Evaluation:  Psychosocial Re-Evaluation     Row Name 05/11/22 0809 06/06/22 0813           Psychosocial Re-Evaluation   Current issues with Current Sleep Concerns Current Stress Concerns      Comments Reviewed patient health questionnaire (PHQ-9) with patient for follow up. Previously, patients score indicated signs/symptoms of  depression.  Reviewed to see if patient is improving symptom wise while in program.  Score declined and patient states that it is because he hasnt been sleeping well and has little energy. Al just completed a new sleep study this last Sunday to get a new CPAP, getting fit for a new mask. He is still waiting on the results. Wakes up from chronic pain at night which he following pain management. His biggest stressor is that his roof is leaking and may need a new one costing him a financial burden. Coming to rehab helps him get his mind off of things and hopes to continue to see improvement.      Expected Outcomes Short: Continue to work toward an improvement in PHQ9 scores by attending [facility] regularly. Long: Continue to improve stress and depression coping skills by talking with staff and attending [facility] regularly and work toward a positive mental state. Short: Get results from sleep study Long: Continue to utilize exercise for stress management and positive attitude      Interventions Encouraged to attend Pulmonary Rehabilitation for the exercise Encouraged to attend Pulmonary Rehabilitation for the exercise  Continue Psychosocial Services  Follow up required by staff Follow up required by staff               Psychosocial Discharge (Final Psychosocial Re-Evaluation):  Psychosocial Re-Evaluation - 06/06/22 0813       Psychosocial Re-Evaluation   Current issues with Current Stress Concerns    Comments Al just completed a new sleep study this last Sunday to get a new CPAP, getting fit for a new mask. He is still waiting on the results. Wakes up from chronic pain at night which he following pain management. His biggest stressor is that his roof is leaking and may need a new one costing him a financial burden. Coming to rehab helps him get his mind off of things and hopes to continue to see improvement.    Expected Outcomes Short: Get results from sleep study Long: Continue to utilize  exercise for stress management and positive attitude    Interventions Encouraged to attend Pulmonary Rehabilitation for the exercise    Continue Psychosocial Services  Follow up required by staff             Education: Education Goals: Education classes will be provided on a weekly basis, covering required topics. Participant will state understanding/return demonstration of topics presented.  Learning Barriers/Preferences:  Learning Barriers/Preferences - 04/12/22 1257       Learning Barriers/Preferences   Learning Barriers Hearing    Learning Preferences None             General Pulmonary Education Topics:  Infection Prevention: - Provides verbal and written material to individual with discussion of infection control including proper hand washing and proper equipment cleaning during exercise session. Flowsheet Row Pulmonary Rehab from 06/15/2022 in [facility] Cardiac and Pulmonary Rehab  Date 04/10/22  Educator [facility]  Instruction Review Code 1- Verbalizes Understanding       Falls Prevention: - Provides verbal and written material to individual with discussion of falls prevention and safety. Flowsheet Row Pulmonary Rehab from 06/15/2022 in [facility] Pulmonary Rehab  Date 04/10/22  Educator [facility]  Instruction Review Code 1- Verbalizes Understanding       Chronic Lung Disease Review: - Group verbal instruction with posters, models, PowerPoint presentations and videos,  to review new updates, new respiratory medications, new advancements in procedures and treatments. Providing information on websites and "800" numbers for continued self-education. Includes information about supplement oxygen, available portable oxygen systems, continuous and intermittent flow rates, oxygen safety, concentrators, and Medicare reimbursement for oxygen. Explanation of Pulmonary Drugs, including class, frequency, complications, importance of spacers, rinsing mouth after steroid MDI's, and proper  cleaning methods for nebulizers. Review of basic lung anatomy and physiology related to function, structure, and complications of lung disease. Review of risk factors. Discussion about methods for diagnosing sleep apnea and types of masks and machines for OSA. Includes a review of the use of types of environmental controls: home humidity, furnaces, filters, dust mite/pet prevention, HEPA vacuums. Discussion about weather changes, air quality and the benefits of nasal washing. Instruction on Warning signs, infection symptoms, calling MD promptly, preventive modes, and value of vaccinations. Review of effective airway clearance, coughing and/or vibration techniques. Emphasizing that all should Create an Action Plan. Written material given at graduation. Flowsheet Row Pulmonary Rehab from 06/15/2022 in [facility] Cardiac and Pulmonary Rehab  Education need identified 04/12/22       AED/CPR: - Group verbal and written instruction with the use of models to demonstrate the basic use of the AED  with the basic ABC's of resuscitation.    Anatomy and Cardiac Procedures: - Group verbal and visual presentation and models provide information about basic cardiac anatomy and function. Reviews the testing methods done to diagnose heart disease and the outcomes of the test results. Describes the treatment choices: Medical Management, Angioplasty, or Coronary Bypass Surgery for treating various heart conditions including Myocardial Infarction, Angina, Valve Disease, and Cardiac Arrhythmias.  Written material given at graduation.   Medication Safety: - Group verbal and visual instruction to review commonly prescribed medications for heart and lung disease. Reviews the medication, class of the drug, and side effects. Includes the steps to properly store meds and maintain the prescription regimen.  Written material given at graduation. Flowsheet Row Pulmonary Rehab from 06/15/2022 in [facility] Cardiac and Pulmonary Rehab  Date  06/15/22  Educator SB  Instruction Review Code 1- Verbalizes Understanding       Other: -Provides group and verbal instruction on various topics (see comments)   Knowledge Questionnaire Score:  Knowledge Questionnaire Score - 04/12/22 1257       Knowledge Questionnaire Score   Pre Score 11/18              Core Components/Risk Factors/Patient Goals at Admission:  Personal Goals and Risk Factors at Admission - 04/12/22 1257       Core Components/Risk Factors/Patient Goals on Admission    Weight Management Yes;Weight Loss;Obesity    Intervention Weight Management: Develop a combined nutrition and exercise program designed to reach desired caloric intake, while maintaining appropriate intake of nutrient and fiber, sodium and fats, and appropriate energy expenditure required for the weight goal.;Weight Management: Provide education and appropriate resources to help participant work on and attain dietary goals.;Weight Management/Obesity: Establish reasonable short term and long term weight goals.;Obesity: Provide education and appropriate resources to help participant work on and attain dietary goals.    Admit Weight 219 lb 11.2 oz (99.7 kg)    Goal Weight: Short Term 214 lb (97.1 kg)    Goal Weight: Long Term 200 lb (90.7 kg)    Expected Outcomes Short Term: Continue to assess and modify interventions until short term weight is achieved;Long Term: Adherence to nutrition and physical activity/exercise program aimed toward attainment of established weight goal;Weight Loss: Understanding of general recommendations for a balanced deficit meal plan, which promotes 1-2 lb weight loss per week and includes a negative energy balance of [phone] kcal/d;Understanding recommendations for meals to include 15-35% energy as protein, 25-35% energy from fat, 35-60% energy from carbohydrates, less than 200mg  of dietary cholesterol, 20-35 gm of total fiber daily;Understanding of distribution of calorie  intake throughout the day with the consumption of 4-5 meals/snacks    Improve shortness of breath with ADL's Yes    Intervention Provide education, individualized exercise plan and daily activity instruction to help decrease symptoms of SOB with activities of daily living.    Expected Outcomes Short Term: Improve cardiorespiratory fitness to achieve a reduction of symptoms when performing ADLs;Long Term: Be able to perform more ADLs without symptoms or delay the onset of symptoms    Increase knowledge of respiratory medications and ability to use respiratory devices properly  Yes    Intervention Provide education and demonstration as needed of appropriate use of medications, inhalers, and oxygen therapy.    Expected Outcomes Short Term: Achieves understanding of medications use. Understands that oxygen is a medication prescribed by physician. Demonstrates appropriate use of inhaler and oxygen therapy.;Long Term: Maintain appropriate use of medications, inhalers, and  oxygen therapy.    Diabetes Yes    Intervention Provide education about signs/symptoms and action to take for hypo/hyperglycemia.;Provide education about proper nutrition, including hydration, and aerobic/resistive exercise prescription along with prescribed medications to achieve blood glucose in normal ranges: Fasting glucose 65-99 mg/dL    Expected Outcomes Short Term: Participant verbalizes understanding of the signs/symptoms and immediate care of hyper/hypoglycemia, proper foot care and importance of medication, aerobic/resistive exercise and nutrition plan for blood glucose control.;Long Term: Attainment of HbA1C < 7%.    Hypertension Yes    Intervention Provide education on lifestyle modifcations including regular physical activity/exercise, weight management, moderate sodium restriction and increased consumption of fresh fruit, vegetables, and low fat dairy, alcohol moderation, and smoking cessation.;Monitor prescription use  compliance.    Expected Outcomes Short Term: Continued assessment and intervention until BP is < 140/42mm HG in hypertensive participants. < 130/60mm HG in hypertensive participants with diabetes, heart failure or chronic kidney disease.;Long Term: Maintenance of blood pressure at goal levels.    Intervention Provide education and support for participant on nutrition & aerobic/resistive exercise along with prescribed medications to achieve LDL 70mg , HDL >40mg .    Expected Outcomes Short Term: Participant states understanding of desired cholesterol values and is compliant with medications prescribed. Participant is following exercise prescription and nutrition guidelines.;Long Term: Cholesterol controlled with medications as prescribed, with individualized exercise RX and with personalized nutrition plan. Value goals: LDL < 70mg , HDL > 40 mg.             Education:Diabetes - Individual verbal and written instruction to review signs/symptoms of diabetes, desired ranges of glucose level fasting, after meals and with exercise. Acknowledge that pre and post exercise glucose checks will be done for 3 sessions at entry of program. Flowsheet Row Pulmonary Rehab from 06/15/2022 in [facility] Cardiac and Pulmonary Rehab  Date 04/10/22  Educator [facility]  Instruction Review Code 1- Verbalizes Understanding       Know Your Numbers and Heart Failure: - Group verbal and visual instruction to discuss disease risk factors for cardiac and pulmonary disease and treatment options.  Reviews associated critical values for Overweight/Obesity, Hypertension, Cholesterol, and Diabetes.  Discusses basics of heart failure: signs/symptoms and treatments.  Introduces Heart Failure Zone chart for action plan for heart failure.  Written material given at graduation.   Core Components/Risk Factors/Patient Goals Review:   Goals and Risk Factor Review     Row Name 05/23/22 0753 06/06/22 0805           Core Components/Risk  Factors/Patient Goals Review   Personal Goals Review Weight Management/Obesity;Improve shortness of breath with ADL's;Increase knowledge of respiratory medications and ability to use respiratory devices properly.;Diabetes;Hypertension Weight Management/Obesity;Improve shortness of breath with ADL's;Increase knowledge of respiratory medications and ability to use respiratory devices properly.;Diabetes;Hypertension      Review Al is doing well in rehab.  His weight is trending down and is down to 211 here and 205 at home.  He is pleased with his progress.  His blood pressures are doing well haning around 130s/80s.  His sugars have been around 130s as well.  He checks both daily at home.  His breathing is rough today.  Overall, it has been getting better. He is using his inhaler and nebulizer each day. Al states he feels like he's doing a lot at home at the moment as he is participating in light therapy for his neuropathy in addition to his home exercise. He has good and bad breathing days and continues to  monitor his O2 and using his oxygen prn. He is taking all medications OK. Continues to use his inhaler and nebulizer. He is now able to walk down here using his walker. BP has been running nicely at home and rehab, ranging around 120/70s. He checks it daily. So far he has maintained his weight. Overall he has lost 20 lbs in 3 months from Ozempic and wants to be under 200 lb. Today he was around 210 lb. He checks his  blood sugar every morning which is ranging around 130 every day, where his MD wants it to be.      Expected Outcomes Short: Continue to use breathing treatments on bad breathing days Long: Conitnue to work on weight loss Short: Continue to work on weight loss and monitor BP/BS Long: Work toward weight goal under 200 lb               Core Components/Risk Factors/Patient Goals at Discharge (Final Review):   Goals and Risk Factor Review - 06/06/22 0805       Core Components/Risk  Factors/Patient Goals Review   Personal Goals Review Weight Management/Obesity;Improve shortness of breath with ADL's;Increase knowledge of respiratory medications and ability to use respiratory devices properly.;Diabetes;Hypertension    Review Al states he feels like he's doing a lot at home at the moment as he is participating in light therapy for his neuropathy in addition to his home exercise. He has good and bad breathing days and continues to monitor his O2 and using his oxygen prn. He is taking all medications OK. Continues to use his inhaler and nebulizer. He is now able to walk down here using his walker. BP has been running nicely at home and rehab, ranging around 120/70s. He checks it daily. So far he has maintained his weight. Overall he has lost 20 lbs in 3 months from Ozempic and wants to be under 200 lb. Today he was around 210 lb. He checks his  blood sugar every morning which is ranging around 130 every day, where his MD wants it to be.    Expected Outcomes Short: Continue to work on weight loss and monitor BP/BS Long: Work toward weight goal under 200 lb             ITP Comments:  ITP Comments     Row Name 04/10/22 0937 04/12/22 1141 04/25/22 0840 05/03/22 0927 05/31/22 1349   ITP Comments Virtual Visit completed. Patient informed on EP and RD appointment and 6 Minute walk test. Patient also informed of patient health questionnaires on My Chart. Patient Verbalizes understanding. Visit diagnosis can be found in [facility] 02/01/2022. Completed and gym orientation. Initial ITP created and sent for review to Dr. Jinny Sanders, Medical Director. First full day of exercise!  Patient was oriented to gym and equipment including functions, settings, policies, and procedures.  Patient's individual exercise prescription and treatment plan were reviewed.  All starting workloads were established based on the results of the 6 minute walk test done at initial orientation visit.  The plan for  exercise progression was also introduced and progression will be customized based on patient's performance and goals. 30 Day review completed. Medical Director ITP review done, changes made as directed, and signed approval by Medical Director.     new to program 30 day review completed. ITP sent to Dr. Jinny Sanders, Medical Director of  Pulmonary Rehab. Continue with ITP unless changes are made by physician.    Row Name 06/28/22 [id number]  ITP Comments 30 Day review completed. Medical Director ITP review done, changes made as directed, and signed approval by Medical Director.                Comments:

## 2022-06-29 ENCOUNTER — Encounter: Payer: Medicare Other | Admitting: *Deleted

## 2022-06-29 DIAGNOSIS — D860 Sarcoidosis of lung: Secondary | ICD-10-CM | POA: Diagnosis not present

## 2022-06-29 DIAGNOSIS — D869 Sarcoidosis, unspecified: Secondary | ICD-10-CM

## 2022-06-29 NOTE — Progress Notes (Signed)
Daily Session Note  Patient Details  Name: HUZAIFA BUJAK MRN: [id number] Date of Birth: 1950-05-10 Referring Provider:   Flowsheet Row Pulmonary Rehab from 04/12/2022 in [facility] Cardiac and Pulmonary Rehab  Referring Provider Landry Dyke MD       Encounter Date: 06/29/2022  Check In:  Session Check In - 06/29/22 0750       Check-In   Supervising physician immediately available to respond to emergencies See telemetry face sheet for immediately available ER MD    Location [facility]-Cardiac & Pulmonary Rehab    Staff Present Lanny Hurst, RN, Odella Aquas, MS, ACSM CEP, Exercise Physiologist;Jessica Juanetta Gosling, MA, RCEP, CCRP, CCET    Virtual Visit No    Medication changes reported     No    Fall or balance concerns reported    No    Warm-up and Cool-down Performed on first and last piece of equipment    Resistance Training Performed Yes    VAD Patient? No    PAD/SET Patient? No      Pain Assessment   Currently in Pain? No/denies                Social History   Tobacco Use  Smoking Status Former   Packs/day: 0.25   Years: 8.00   Additional pack years: 0.00   Total pack years: 2.00   Types: Cigarettes   Quit date: 03/16/1977   Years since quitting: 45.3   Passive exposure: Past  Smokeless Tobacco Former    Goals Met:  Independence with exercise equipment Exercise tolerated well No report of concerns or symptoms today Strength training completed today  Goals Unmet:  Not Applicable  Comments: Pt able to follow exercise prescription today without complaint.  Will continue to monitor for progression.    Dr. Bethann Punches is Medical Director for [facility] Cardiac Rehabilitation.  Dr. Vida Rigger is Medical Director for [facility] Pulmonary Rehabilitation.

## 2022-07-06 ENCOUNTER — Encounter: Payer: Medicare Other | Admitting: *Deleted

## 2022-07-06 DIAGNOSIS — D869 Sarcoidosis, unspecified: Secondary | ICD-10-CM

## 2022-07-06 DIAGNOSIS — D860 Sarcoidosis of lung: Secondary | ICD-10-CM | POA: Diagnosis not present

## 2022-07-06 NOTE — Progress Notes (Signed)
Daily Session Note  Patient Details  Name: Ethan Frye MRN: [id number] Date of Birth: 1950-10-24 Referring Provider:   Flowsheet Row Pulmonary Rehab from 04/12/2022 in [facility] Cardiac and Pulmonary Rehab  Referring Provider Landry Dyke MD       Encounter Date: 07/06/2022  Check In:  Session Check In - 07/06/22 0744       Check-In   Supervising physician immediately available to respond to emergencies See telemetry face sheet for immediately available ER MD    Location [facility]-Cardiac & Pulmonary Rehab    Staff Present Lanny Hurst, RN, ADN;Joseph Hood, RCP,RRT,BSRT;Jessica [place], MA, RCEP, CCRP, CCET    Virtual Visit No    Medication changes reported     No    Fall or balance concerns reported    No    Warm-up and Cool-down Performed on first and last piece of equipment    Resistance Training Performed Yes    VAD Patient? No    PAD/SET Patient? No      Pain Assessment   Currently in Pain? No/denies                Social History   Tobacco Use  Smoking Status Former   Packs/day: 0.25   Years: 8.00   Additional pack years: 0.00   Total pack years: 2.00   Types: Cigarettes   Quit date: 03/16/1977   Years since quitting: 45.3   Passive exposure: Past  Smokeless Tobacco Former    Goals Met:  Independence with exercise equipment Exercise tolerated well No report of concerns or symptoms today Strength training completed today  Goals Unmet:  Not Applicable  Comments: Pt able to follow exercise prescription today without complaint.  Will continue to monitor for progression.    Dr. Bethann Punches is Medical Director for [facility] Cardiac Rehabilitation.  Dr. Vida Rigger is Medical Director for [facility] Pulmonary Rehabilitation.

## 2022-07-11 ENCOUNTER — Encounter: Payer: Medicare Other | Admitting: *Deleted

## 2022-07-11 DIAGNOSIS — D860 Sarcoidosis of lung: Secondary | ICD-10-CM | POA: Diagnosis not present

## 2022-07-11 DIAGNOSIS — D869 Sarcoidosis, unspecified: Secondary | ICD-10-CM

## 2022-07-11 NOTE — Progress Notes (Signed)
Daily Session Note  Patient Details  Name: TYMEL KOPACZ MRN: [id number] Date of Birth: 24-Jul-1950 Referring Provider:   Flowsheet Row Pulmonary Rehab from 04/12/2022 in [facility] Cardiac and Pulmonary Rehab  Referring Provider Landry Dyke MD       Encounter Date: 07/11/2022  Check In:  Session Check In - 07/11/22 0829       Check-In   Supervising physician immediately available to respond to emergencies See telemetry face sheet for immediately available ER MD    Location [facility]-Cardiac & Pulmonary Rehab    Staff Present Cora Collum, RN, BSN, CCRP;Noah Tickle, BS, Exercise Physiologist;Laureen Manson Passey, BS, RRT, CPFT    Virtual Visit No    Medication changes reported     No    Fall or balance concerns reported    No    Warm-up and Cool-down Performed on first and last piece of equipment    Resistance Training Performed Yes    VAD Patient? No    PAD/SET Patient? No      Pain Assessment   Currently in Pain? No/denies                Social History   Tobacco Use  Smoking Status Former   Packs/day: 0.25   Years: 8.00   Additional pack years: 0.00   Total pack years: 2.00   Types: Cigarettes   Quit date: 03/16/1977   Years since quitting: 45.3   Passive exposure: Past  Smokeless Tobacco Former    Goals Met:  Proper associated with RPD/PD & O2 Sat Independence with exercise equipment Exercise tolerated well No report of concerns or symptoms today  Goals Unmet:  Not Applicable  Comments: Pt able to follow exercise prescription today without complaint.  Will continue to monitor for progression.    Dr. Bethann Punches is Medical Director for [facility] Cardiac Rehabilitation.  Dr. Vida Rigger is Medical Director for [facility] Pulmonary Rehabilitation.

## 2022-07-13 ENCOUNTER — Encounter: Payer: Medicare Other | Admitting: *Deleted

## 2022-07-13 DIAGNOSIS — D860 Sarcoidosis of lung: Secondary | ICD-10-CM | POA: Diagnosis not present

## 2022-07-13 DIAGNOSIS — D869 Sarcoidosis, unspecified: Secondary | ICD-10-CM

## 2022-07-13 NOTE — Progress Notes (Signed)
Daily Session Note  Patient Details  Name: Ethan Frye MRN: [id number] Date of Birth: 02-15-1950 Referring Provider:   Flowsheet Row Pulmonary Rehab from 04/12/2022 in [facility] Cardiac and Pulmonary Rehab  Referring Provider Landry Dyke MD       Encounter Date: 07/13/2022  Check In:  Session Check In - 07/13/22 0749       Check-In   Supervising physician immediately available to respond to emergencies See telemetry face sheet for immediately available ER MD    Location [facility]-Cardiac & Pulmonary Rehab    Staff Present Lanny Hurst, RN, ADN;Denise Rhew, PhD, RN, CNS, CEN;Other   [place], MS   Virtual Visit No    Medication changes reported     No    Fall or balance concerns reported    No    Warm-up and Cool-down Performed on first and last piece of equipment    Resistance Training Performed Yes    VAD Patient? No    PAD/SET Patient? No      Pain Assessment   Currently in Pain? No/denies                Social History   Tobacco Use  Smoking Status Former   Packs/day: 0.25   Years: 8.00   Additional pack years: 0.00   Total pack years: 2.00   Types: Cigarettes   Quit date: 03/16/1977   Years since quitting: 45.3   Passive exposure: Past  Smokeless Tobacco Former    Goals Met:  Independence with exercise equipment Exercise tolerated well No report of concerns or symptoms today Strength training completed today  Goals Unmet:  Not Applicable  Comments: Pt able to follow exercise prescription today without complaint.  Will continue to monitor for progression.    Dr. Bethann Punches is Medical Director for [facility] Cardiac Rehabilitation.  Dr. Vida Rigger is Medical Director for [facility] Pulmonary Rehabilitation.

## 2022-07-20 ENCOUNTER — Encounter: Payer: Medicare Other | Attending: Pulmonary Disease | Admitting: *Deleted

## 2022-07-20 DIAGNOSIS — R0602 Shortness of breath: Secondary | ICD-10-CM | POA: Diagnosis present

## 2022-07-20 DIAGNOSIS — J4489 Other specified chronic obstructive pulmonary disease: Secondary | ICD-10-CM | POA: Diagnosis not present

## 2022-07-20 DIAGNOSIS — D869 Sarcoidosis, unspecified: Secondary | ICD-10-CM | POA: Insufficient documentation

## 2022-07-20 NOTE — Progress Notes (Signed)
Daily Session Note  Patient Details  Name: Ethan Frye MRN: [id number] Date of Birth: [birth year] Referring Provider:   Flowsheet Row Pulmonary Rehab from 04/12/2022 in [facility] Cardiac and Pulmonary Rehab  Referring Provider Landry Dyke MD       Encounter Date: 07/20/2022  Check In:  Session Check In - 07/20/22 4098       Check-In   Supervising physician immediately available to respond to emergencies See telemetry face sheet for immediately available ER MD    Location [facility]-Cardiac & Pulmonary Rehab    Staff Present Cora Collum, RN, BSN, CCRP;Joseph Hunter, [place]   [place] Bigelow HFS   Virtual Visit No    Medication changes reported     No    Fall or balance concerns reported    No    Warm-up and Cool-down Performed on first and last piece of equipment    Resistance Training Performed Yes    VAD Patient? No    PAD/SET Patient? No      Pain Assessment   Currently in Pain? No/denies                Social History   Tobacco Use  Smoking Status Former   Packs/day: 0.25   Years: 8.00   Additional pack years: 0.00   Total pack years: 2.00   Types: Cigarettes   Quit date: 03/16/1977   Years since quitting: 45.3   Passive exposure: Past  Smokeless Tobacco Former    Goals Met:  Proper associated with RPD/PD & O2 Sat Independence with exercise equipment Exercise tolerated well No report of concerns or symptoms today  Goals Unmet:  Not Applicable  Comments: Pt able to follow exercise prescription today without complaint.  Will continue to monitor for progression.    Dr. Bethann Punches is Medical Director for [facility] Cardiac Rehabilitation.  Dr. Vida Rigger is Medical Director for [facility] Pulmonary Rehabilitation.

## 2022-07-26 ENCOUNTER — Encounter: Payer: Self-pay | Admitting: *Deleted

## 2022-07-26 DIAGNOSIS — D869 Sarcoidosis, unspecified: Secondary | ICD-10-CM

## 2022-07-26 NOTE — Progress Notes (Signed)
Pulmonary Individual Treatment Plan  Patient Details  Name: Ethan Frye MRN: [id number] Date of Birth: 03/08/1950 Referring Provider:   Flowsheet Row Pulmonary Rehab from 04/12/2022 in [facility] Cardiac and Pulmonary Rehab  Referring Provider Landry Dyke MD       Initial Encounter Date:  Flowsheet Row Pulmonary Rehab from 04/12/2022 in [facility] Cardiac and Pulmonary Rehab  Date 04/12/22       Visit Diagnosis: Sarcoidosis  Patient's Home Medications on Admission:  Current Outpatient Medications:    acidophilus (RISAQUAD) CAPS capsule, Take 1 capsule by mouth daily., Disp: , Rfl:    albuterol (PROVENTIL) (2.5 MG/3ML) 0.083% nebulizer solution, Take 2.5 mg by nebulization every 6 (six) hours as needed for wheezing or shortness of breath. (Patient not taking: Reported on 04/10/2022), Disp: , Rfl:    albuterol (VENTOLIN HFA) 108 (90 Base) MCG/ACT inhaler, Inhale 2 puffs into the lungs every 6 (six) hours as needed for wheezing or shortness of breath., Disp: , Rfl:    aspirin EC 81 MG tablet, Take 81 mg by mouth daily. Swallow whole., Disp: , Rfl:    aspirin EC 81 MG tablet, Take by mouth. (Patient not taking: Reported on 04/10/2022), Disp: , Rfl:    atorvastatin (LIPITOR) 20 MG tablet, Take 20 mg by mouth daily. (Patient not taking: Reported on 04/10/2022), Disp: , Rfl:    atorvastatin (LIPITOR) 40 MG tablet, Take by mouth., Disp: , Rfl:    azithromycin (ZITHROMAX) 500 MG tablet, Take 500 mg by mouth every Monday, Wednesday, and Friday. (Patient not taking: Reported on 04/10/2022), Disp: , Rfl:    Blood Glucose Monitoring Suppl (FIFTY50 GLUCOSE METER 2.0) w/Device KIT, See admin instructions., Disp: , Rfl:    budesonide (PULMICORT) 0.5 MG/2ML nebulizer solution, Inhale into the lungs., Disp: , Rfl:    carvedilol (COREG) 25 MG tablet, Take 25 mg by mouth 2 (two) times daily. (Patient not taking: Reported on 04/10/2022), Disp: , Rfl:    carvedilol (COREG) 25 MG tablet, Take 1 tablet by mouth 2  (two) times daily with a meal., Disp: , Rfl:    celecoxib (CELEBREX) 200 MG capsule, Take 200 mg by mouth daily as needed for pain. (Patient not taking: Reported on 04/10/2022), Disp: , Rfl:    celecoxib (CELEBREX) 200 MG capsule, Take by mouth., Disp: , Rfl:    cetirizine (ZYRTEC) 10 MG tablet, Take 10 mg by mouth daily., Disp: , Rfl:    cholecalciferol (VITAMIN D) 25 MCG (1000 UNIT) tablet, Take 1,000 Units by mouth daily., Disp: , Rfl:    clonazePAM (KLONOPIN) 1 MG tablet, Take 1 mg by mouth 2 (two) times daily. (Patient not taking: Reported on 04/10/2022), Disp: , Rfl:    clopidogrel (PLAVIX) 75 MG tablet, clopidogrel 75 mg tablet  TAKE 1 TABLET (75 MG TOTAL) BY MOUTH ONCE DAILY., Disp: , Rfl:    Cysteamine Bitartrate (PROCYSBI) 300 MG PACK, Use 1 each 3 (three) times daily Use as instructed., Disp: , Rfl:    diclofenac Sodium (VOLTAREN) 1 % GEL, Apply 2 g topically 4 (four) times daily. (Patient not taking: Reported on 04/10/2022), Disp: , Rfl:    diclofenac Sodium (VOLTAREN) 1 % GEL, Apply 2 g topically 4 (four) times daily., Disp: , Rfl:    doxepin (SINEQUAN) 50 MG capsule, Take 150 mg by mouth at bedtime. (Patient not taking: Reported on 04/10/2022), Disp: , Rfl:    doxepin (SINEQUAN) 50 MG capsule, Take by mouth., Disp: , Rfl:    DULoxetine (CYMBALTA) 60 MG  capsule, Take 60 mg by mouth daily. (Patient not taking: Reported on 04/10/2022), Disp: , Rfl:    DULoxetine (CYMBALTA) 60 MG capsule, Take 1 tablet by mouth daily., Disp: , Rfl:    ferrous sulfate 325 (65 FE) MG tablet, Take 325 mg by mouth daily with breakfast., Disp: , Rfl:    fluticasone-salmeterol (ADVAIR) 500-50 MCG/ACT AEPB, Inhale 1 puff into the lungs every 12 (twelve) hours., Disp: , Rfl:    glucose blood (PRECISION QID TEST) test strip, Use 1 each (1 strip total) once daily Use as instructed., Disp: , Rfl:    HYDROcodone-acetaminophen (NORCO/VICODIN) 5-325 MG tablet, Take 1 tablet by mouth every 4 (four) hours as needed for pain.,  Disp: , Rfl:    ketoconazole (NIZORAL) 2 % cream, ketoconazole 2 % topical cream  APPLY TWICE DAILY TO RASH ON FACE AND GROIN, Disp: , Rfl:    lisinopril (ZESTRIL) 40 MG tablet, Take 40 mg by mouth daily., Disp: , Rfl:    losartan-hydrochlorothiazide (HYZAAR) 100-25 MG tablet, Take 1 tablet by mouth daily., Disp: , Rfl:    metFORMIN (GLUCOPHAGE) 500 MG tablet, Take 500-1,000 mg by mouth 2 (two) times daily with a meal. (Patient not taking: Reported on 04/10/2022), Disp: , Rfl:    metFORMIN (GLUCOPHAGE) 500 MG tablet, Take by mouth., Disp: , Rfl:    methadone (DOLOPHINE) 5 MG tablet, Take 2.5-5 mg by mouth 2 (two) times daily. Take 2.5 mg (one-half tablet) twice daily for one week then increase to 5 mg (one tablet) twice daily, thereafter, Disp: , Rfl:    montelukast (SINGULAIR) 10 MG tablet, Take 10 mg by mouth daily. (Patient not taking: Reported on 04/10/2022), Disp: , Rfl:    montelukast (SINGULAIR) 10 MG tablet, Take 1 tablet by mouth daily., Disp: , Rfl:    moxifloxacin (VIGAMOX) 0.5 % ophthalmic solution, moxifloxacin 0.5 % eye drops  PUT 1 DROP IN RIGHT EYE 4 TIMES A DAY FOR 7 DAYS OR AS DIRECTED, Disp: , Rfl:    Multiple Vitamins-Minerals (MULTIVITAMIN WITH MINERALS) tablet, Take 1 tablet by mouth daily., Disp: , Rfl:    naloxone (NARCAN) nasal spray 4 mg/0.1 mL, Place into the nose., Disp: , Rfl:    nitroGLYCERIN (NITROSTAT) 0.4 MG SL tablet, nitroglycerin 0.4 mg sublingual tablet  PLACE 1 TABLET UNDER THE TONGUE EVERY FOR CHEST PAIN, MAY TAKE UP TO 3 DOSES, Disp: , Rfl:    nystatin (MYCOSTATIN) 100000 UNIT/ML suspension, Take 2 mLs by mouth in the morning, at noon, in the evening, and at bedtime. (Patient not taking: Reported on 04/10/2022), Disp: , Rfl:    nystatin (MYCOSTATIN) 100000 UNIT/ML suspension, nystatin 100,000 unit/mL oral suspension  SWISH AND SWALLOW 5 MLS 4 (FOUR) TIMES DAILY., Disp: , Rfl:    omeprazole (PRILOSEC) 40 MG capsule, Take 40 mg by mouth daily. (Patient not  taking: Reported on 04/10/2022), Disp: , Rfl:    omeprazole (PRILOSEC) 40 MG capsule, Take 1 tablet by mouth daily., Disp: , Rfl:    oxyCODONE (OXY IR/ROXICODONE) 5 MG immediate release tablet, Take by mouth., Disp: , Rfl:    pimecrolimus (ELIDEL) 1 % cream, Apply 1 application topically 2 (two) times daily., Disp: , Rfl:    pimecrolimus (ELIDEL) 1 % cream, Elidel 1 % topical cream  APPLY TWICE DAILY TO AFFECTED AREA(S) FOR 1-2 WEEKS (Patient not taking: Reported on 04/10/2022), Disp: , Rfl:    polyethylene glycol (MIRALAX / GLYCOLAX) 17 g packet, Take 17 g by mouth 2 (two) times daily. (Patient  not taking: Reported on 04/10/2022), Disp: , Rfl:    polyethylene glycol powder (GLYCOLAX/MIRALAX) 17 GM/SCOOP powder, Take by mouth., Disp: , Rfl:    pregabalin (LYRICA) 75 MG capsule, Take 75 mg by mouth 3 (three) times daily., Disp: , Rfl:    Semaglutide,0.25 or 0.5MG /DOS, (OZEMPIC, 0.25 OR 0.5 MG/DOSE,) 2 MG/3ML SOPN, Inject into the skin., Disp: , Rfl:    senna-docusate (SENOKOT-S) 8.6-50 MG tablet, Take 1 tablet by mouth daily. (Patient not taking: Reported on 04/10/2022), Disp: , Rfl:    senna-docusate (SENOKOT-S) 8.6-50 MG tablet, Take 2 tablets by mouth daily., Disp: , Rfl:    spironolactone (ALDACTONE) 25 MG tablet, Take 12.5 mg by mouth daily., Disp: , Rfl:    SUMAtriptan (IMITREX) 50 MG tablet, Take 50 mg by mouth as directed., Disp: , Rfl:    tiZANidine (ZANAFLEX) 4 MG tablet, Take 4 mg by mouth 3 (three) times daily., Disp: , Rfl:    traMADol (ULTRAM) 50 MG tablet, Take 50 mg by mouth every 6 (six) hours as needed for pain., Disp: , Rfl:    vitamin B-12 (CYANOCOBALAMIN) 1000 MCG tablet, Take 1,000 mcg by mouth daily., Disp: , Rfl:   Past Medical History: No past medical history on file.  Tobacco Use: Social History   Tobacco Use  Smoking Status Former   Packs/day: 0.25   Years: 8.00   Additional pack years: 0.00   Total pack years: 2.00   Types: Cigarettes   Quit date: 03/16/1977    Years since quitting: 45.3   Passive exposure: Past  Smokeless Tobacco Former    Labs: Investment banker, operational       Latest Ref Rng & Units 11/04/2020 11/05/2020  Labs for ITP Cardiac and Pulmonary Rehab  Hemoglobin A1c 4.8 - 5.6 % - 6.2   PH, Arterial 7.350 - 7.450 7.24  7.34   PCO2 arterial 32.0 - 48.0 mmHg 44  37   Bicarbonate 20.0 - 28.0 mmol/L 20.2  18.9  20.0   Acid-base deficit 0.0 - 2.0 mmol/L 6.9  8.2  5.2   O2 Saturation % 75.8  89.5  99.2      Pulmonary Assessment Scores:  Pulmonary Assessment Scores     Row Name 04/12/22 1259         ADL UCSD   ADL Phase Entry     SOB Score total 91     Rest 3     Walk 4     Stairs 5     Bath 2     Dress 3     Shop 4       CAT Score   CAT Score 32       mMRC Score   mMRC Score 4              UCSD: Self-administered rating of dyspnea associated with activities of daily living (ADLs) 6-point scale (0 = "not at all" to 5 = "maximal or unable to do because of breathlessness")  Scoring Scores range from 0 to 120.  Minimally important difference is 5 units  CAT: CAT can identify the health impairment of COPD patients and is better correlated with disease progression.  CAT has a scoring range of zero to 40. The CAT score is classified into four groups of low (less than 10), medium (10 - 20), high (21-30) and very high (31-40) based on the impact level of disease on health status. A CAT score over 10 suggests significant symptoms.  A worsening CAT score  could be explained by an exacerbation, poor medication adherence, poor inhaler technique, or progression of COPD or comorbid conditions.  CAT MCID is 2 points  mMRC: mMRC (Modified Medical Research Council) Dyspnea Scale is used to assess the degree of baseline functional disability in patients of respiratory disease due to dyspnea. No minimal important difference is established. A decrease in score of 1 point or greater is considered a positive change.   Pulmonary Function  Assessment:  Pulmonary Function Assessment - 04/12/22 1259       Breath   Shortness of Breath Yes;Limiting activity;Panic with Shortness of Breath             Exercise Target Goals: Exercise Program Goal: Individual exercise prescription set using results from initial 6 min walk test and THRR while considering  patient's activity barriers and safety.   Exercise Prescription Goal: Initial exercise prescription builds to 30-45 minutes a day of aerobic activity, 2-3 days per week.  Home exercise guidelines will be given to patient during program as part of exercise prescription that the participant will acknowledge.  Education: Aerobic Exercise: - Group verbal and visual presentation on the components of exercise prescription. Introduces F.I.T.T principle from ACSM for exercise prescriptions.  Reviews F.I.T.T. principles of aerobic exercise including progression. Written material given at graduation. Flowsheet Row Pulmonary Rehab from 06/15/2022 in [facility] Cardiac and Pulmonary Rehab  Education need identified 04/12/22  Date 05/11/22  Educator [facility]  Instruction Review Code 1- Verbalizes Understanding       Education: Resistance Exercise: - Group verbal and visual presentation on the components of exercise prescription. Introduces F.I.T.T principle from ACSM for exercise prescriptions  Reviews F.I.T.T. principles of resistance exercise including progression. Written material given at graduation. Flowsheet Row Pulmonary Rehab from 06/15/2022 in [facility] Cardiac and Pulmonary Rehab  Date 05/18/22  Educator NT  Instruction Review Code 1- Verbalizes Understanding        Education: Exercise & Equipment Safety: - Individual verbal instruction and demonstration of equipment use and safety with use of the equipment. Flowsheet Row Pulmonary Rehab from 06/15/2022 in [facility] Cardiac and Pulmonary Rehab  Date 04/10/22  Educator [facility]  Instruction Review Code 1- Verbalizes Understanding       Education:  Exercise Physiology & General Exercise Guidelines: - Group verbal and written instruction with models to review the exercise physiology of the cardiovascular system and associated critical values. Provides general exercise guidelines with specific guidelines to those with heart or lung disease.    Education: Flexibility, Balance, Mind/Body Relaxation: - Group verbal and visual presentation with interactive activity on the components of exercise prescription. Introduces F.I.T.T principle from ACSM for exercise prescriptions. Reviews F.I.T.T. principles of flexibility and balance exercise training including progression. Also discusses the mind body connection.  Reviews various relaxation techniques to help reduce and manage stress (i.e. Deep breathing, progressive muscle relaxation, and visualization). Balance handout provided to take home. Written material given at graduation. Flowsheet Row Pulmonary Rehab from 06/15/2022 in [facility] Cardiac and Pulmonary Rehab  Date 05/18/22  Educator NT  Instruction Review Code 1- Verbalizes Understanding       Activity Barriers & Risk Stratification:  Activity Barriers & Cardiac Risk Stratification - 04/12/22 1154       Activity Barriers & Cardiac Risk Stratification   Activity Barriers Arthritis;Fibromyalgia;Joint Problems;Shortness of Breath;Muscular Weakness;Deconditioning;Balance Concerns;History of Falls;Assistive Device   chronic pain from fibromyalgia and RA in hands/shoulders/knees, fell at home on Sunday bruised left side  6 Minute Walk:  6 Minute Walk     Row Name 04/12/22 1141         6 Minute Walk   Phase Initial     Distance 840 feet     Walk Time 6 minutes     # of Rest Breaks 0     MPH 1.59     METS 1.86     RPE 17     Perceived Dyspnea  3     VO2 Peak 6.51     Symptoms Yes (comment)     Comments SOB, chronic hip pain     Resting HR 78 bpm     Resting BP 118/68     Resting Oxygen Saturation  94 %     Exercise  Oxygen Saturation  during 6 min walk 89 %     Max Ex. HR 92 bpm     Max Ex. BP 146/74     2 Minute Post BP 136/72       Interval HR   1 Minute HR 86     2 Minute HR 91     3 Minute HR 90     4 Minute HR 91     5 Minute HR 92     6 Minute HR 92     2 Minute Post HR 78     Interval Heart Rate? Yes       Interval Oxygen   Interval Oxygen? Yes     Baseline Oxygen Saturation % 94 %     1 Minute Oxygen Saturation % 90 %     1 Minute Liters of Oxygen 0 L  Room Air     2 Minute Oxygen Saturation % 90 %     2 Minute Liters of Oxygen 0 L     3 Minute Oxygen Saturation % 89 %     3 Minute Liters of Oxygen 0 L     4 Minute Oxygen Saturation % 89 %     4 Minute Liters of Oxygen 0 L     5 Minute Oxygen Saturation % 90 %     5 Minute Liters of Oxygen 0 L     6 Minute Oxygen Saturation % 90 %     6 Minute Liters of Oxygen 0 L     2 Minute Post Oxygen Saturation % 94 %     2 Minute Post Liters of Oxygen 0 L             Oxygen Initial Assessment:  Oxygen Initial Assessment - 06/06/22 0803       Home Oxygen   Home Oxygen Device Home Concentrator    Sleep Oxygen Prescription Continuous;CPAP   new sleep study completed, waiting on results   Home Exercise Oxygen Prescription Continuous   prn   Liters per minute 2    Home Resting Oxygen Prescription Continuous   prn   Liters per minute 2    Compliance with Home Oxygen Use Yes   only prn     Program Oxygen Prescription   Program Oxygen Prescription None   Can use O2 prn     Intervention   Short Term Goals To learn and exhibit compliance with exercise, home and travel O2 prescription;To learn and understand importance of monitoring SPO2 with pulse oximeter and demonstrate accurate use of the pulse oximeter.;To learn and understand importance of maintaining oxygen saturations>88%;To learn and demonstrate proper pursed lip breathing techniques or other breathing techniques. ;  To learn and demonstrate proper use of respiratory medications     Long  Term Goals Verbalizes importance of monitoring SPO2 with pulse oximeter and return demonstration;Exhibits proper breathing techniques, such as pursed lip breathing or other method taught during program session;Demonstrates proper use of MDI's;Compliance with respiratory medication;Maintenance of O2 saturations>88%;Exhibits compliance with exercise, home  and travel O2 prescription             Oxygen Re-Evaluation:  Oxygen Re-Evaluation     Row Name 04/25/22 0840 05/23/22 0756 06/06/22 0803 07/06/22 0804       Program Oxygen Prescription   Program Oxygen Prescription None None -- None      Home Oxygen   Home Oxygen Device Home Concentrator Home Concentrator -- Home Concentrator    Sleep Oxygen Prescription Continuous;CPAP Continuous;CPAP -- Continuous;CPAP    Liters per minute 0 0 -- 2    Home Exercise Oxygen Prescription -- Continuous -- None    Liters per minute 2 2 -- --    Home Resting Oxygen Prescription Continuous Continuous -- None    Liters per minute 2 2 -- --    Compliance with Home Oxygen Use -- No -- Yes  except CPAP, waiting for new mask      Goals/Expected Outcomes   Short Term Goals To learn and demonstrate proper pursed lip breathing techniques or other breathing techniques.  To learn and exhibit compliance with exercise, home and travel O2 prescription;To learn and understand importance of monitoring SPO2 with pulse oximeter and demonstrate accurate use of the pulse oximeter.;To learn and understand importance of maintaining oxygen saturations>88%;To learn and demonstrate proper pursed lip breathing techniques or other breathing techniques. ;To learn and demonstrate proper use of respiratory medications -- To learn and exhibit compliance with exercise, home and travel O2 prescription;To learn and understand importance of monitoring SPO2 with pulse oximeter and demonstrate accurate use of the pulse oximeter.;To learn and understand importance of maintaining oxygen  saturations>88%;To learn and demonstrate proper pursed lip breathing techniques or other breathing techniques. ;To learn and demonstrate proper use of respiratory medications    Long  Term Goals -- Verbalizes importance of monitoring SPO2 with pulse oximeter and return demonstration;Exhibits proper breathing techniques, such as pursed lip breathing or other method taught during program session;Demonstrates proper use of MDI's;Compliance with respiratory medication;Maintenance of O2 saturations>88%;Exhibits compliance with exercise, home  and travel O2 prescription -- Verbalizes importance of monitoring SPO2 with pulse oximeter and return demonstration;Exhibits proper breathing techniques, such as pursed lip breathing or other method taught during program session;Demonstrates proper use of MDI's;Compliance with respiratory medication;Maintenance of O2 saturations>88%;Exhibits compliance with exercise, home  and travel O2 prescription    Comments Reviewed PLB technique with pt.  Talked about how it works and it's importance in maintaining their exercise saturations. Al is scheduled for a sleep study as his equipment has not been working well for him and is out of date. He tried to use his CPAP but it was putting too much pressure on his teeth and nose.  He has been wearing his oxygen at home.  His saturations are doing pretty good overall.  Walking will still drop him on occassion.  He is doing well with his PLB. Al continues to do well. He just had a completed a sleep study this past Sunday and is awaiting the results. He is supposed to be getting a new mask this will fit him better with his CPAP. His oxygen saturations at rehab have been staying above 88%  on RA. He continues to check them at home and uses oxygen as needed. Continues to use PLB. Al has not been able to use his CPAP at night as his mask does not fit.  He is still waiting to hear back from doctor about it.  He is wearing oxygen at night.  He is not  needing his oxygen during the day except on occassion when he feels short of breath and then he will use it to help recover.  He is good about using his PLB and inhalers.    Goals/Expected Outcomes Short: Become more profiecient at using PLB. Long: Become independent at using PLB. Short; Get sleep study done Long; Continue to use PLB Short: Get results from sleep study and follow doctor orders on CPAP Long: Continue to monitor O2 and use PLB long-term Shrot: Continue to use O2 as needed Long: conitnue to use PLB             Oxygen Discharge (Final Oxygen Re-Evaluation):  Oxygen Re-Evaluation - 07/06/22 0804       Program Oxygen Prescription   Program Oxygen Prescription None      Home Oxygen   Home Oxygen Device Home Concentrator    Sleep Oxygen Prescription Continuous;CPAP    Liters per minute 2    Home Exercise Oxygen Prescription None    Home Resting Oxygen Prescription None    Compliance with Home Oxygen Use Yes   except CPAP, waiting for new mask     Goals/Expected Outcomes   Short Term Goals To learn and exhibit compliance with exercise, home and travel O2 prescription;To learn and understand importance of monitoring SPO2 with pulse oximeter and demonstrate accurate use of the pulse oximeter.;To learn and understand importance of maintaining oxygen saturations>88%;To learn and demonstrate proper pursed lip breathing techniques or other breathing techniques. ;To learn and demonstrate proper use of respiratory medications    Long  Term Goals Verbalizes importance of monitoring SPO2 with pulse oximeter and return demonstration;Exhibits proper breathing techniques, such as pursed lip breathing or other method taught during program session;Demonstrates proper use of MDI's;Compliance with respiratory medication;Maintenance of O2 saturations>88%;Exhibits compliance with exercise, home  and travel O2 prescription    Comments Al has not been able to use his CPAP at night as his mask does not  fit.  He is still waiting to hear back from doctor about it.  He is wearing oxygen at night.  He is not needing his oxygen during the day except on occassion when he feels short of breath and then he will use it to help recover.  He is good about using his PLB and inhalers.    Goals/Expected Outcomes Shrot: Continue to use O2 as needed Long: conitnue to use PLB             Initial Exercise Prescription:  Initial Exercise Prescription - 04/12/22 1100       Date of Initial Exercise RX and Referring Provider   Date 04/12/22    Referring Provider Landry Dyke MD      Oxygen   Oxygen --   may need oxygen is desaturates with exercise   Maintain Oxygen Saturation 88% or higher      Recumbant Bike   Level 1    RPM 50    Watts 10    Minutes 15    METs 1.8      T5 Nustep   Level 1    SPM 80    Minutes 15  METs 1.8      Biostep-RELP   Level 1    SPM 50    Minutes 15    METs 2      Track   Laps 21    Minutes 15    METs 2.14      Prescription Details   Frequency (times per week) 2    Duration Progress to 30 minutes of continuous aerobic without signs/symptoms of physical distress      Intensity   THRR 40-80% of Max Heartrate 106-135    Ratings of Perceived Exertion 11-13    Perceived Dyspnea 0-4      Progression   Progression Continue to progress workloads to maintain intensity without signs/symptoms of physical distress.      Resistance Training   Training Prescription Yes    Weight 3 lb    Reps 10-15             Perform Capillary Blood Glucose checks as needed.  Exercise Prescription Changes:   Exercise Prescription Changes     Row Name 04/12/22 1100 05/04/22 1400 05/18/22 1200 06/01/22 0800 06/01/22 1400     Response to Exercise   Blood Pressure (Admit) 118/68 126/70 104/66 -- 142/70   Blood Pressure (Exercise) 146/74 140/76 -- -- 136/76   Blood Pressure (Exit) 142/60 130/74 112/62 -- 124/62   Heart Rate (Admit) 78 bpm 88 bpm 72 bpm -- 72  bpm   Heart Rate (Exercise) 92 bpm 87 bpm 93 bpm -- 104 bpm   Heart Rate (Exit) 74 bpm 85 bpm 84 bpm -- 85 bpm   Oxygen Saturation (Admit) 94 % 90 % 96 % -- 94 %   Oxygen Saturation (Exercise) 89 % 92 % 91 % -- 90 %   Oxygen Saturation (Exit) 94 % 95 % 95 % -- 97 %   Rating of Perceived Exertion (Exercise) 17 15 16  -- 15   Perceived Dyspnea (Exercise) 3 3 3  -- 3   Symptoms SOB, hip pain none SOB -- SOB, dizziness   Comments walk test results First two days of exercise -- -- --   Duration -- Progress to 30 minutes of  aerobic without signs/symptoms of physical distress Progress to 30 minutes of  aerobic without signs/symptoms of physical distress -- Progress to 30 minutes of  aerobic without signs/symptoms of physical distress   Intensity -- THRR unchanged THRR unchanged -- THRR unchanged     Progression   Progression -- Continue to progress workloads to maintain intensity without signs/symptoms of physical distress. Continue to progress workloads to maintain intensity without signs/symptoms of physical distress. -- Continue to progress workloads to maintain intensity without signs/symptoms of physical distress.   Average METs -- 1.86 1.95 -- 1.67     Resistance Training   Training Prescription -- Yes Yes -- Yes   Weight -- 3 lb 3 lb -- 3 lb   Reps -- 10-15 10-15 -- 10-15     Interval Training   Interval Training -- No No -- No     Oxygen   Oxygen -- -- --  prn -- --     Recumbant Bike   Level -- 1 -- -- --   Watts -- 10 -- -- --   Minutes -- 15 -- -- --   METs -- 2.32 -- -- --     NuStep   Level -- 1 1 -- 1   Minutes -- 30 30 -- 15   METs -- 1.8 2 -- --  Biostep-RELP   Level -- -- 1 -- 2   Minutes -- -- 15 -- 30   METs -- -- 2 -- 2     Track   Laps -- -- 2  trying out to start -- 2  dizzy   Minutes -- -- 15 -- 15   METs -- -- 1 -- 1     Home Exercise Plan   Plans to continue exercise at -- -- -- Home (comment)  walking,  recumbent bike Home (comment)  walking,   recumbent bike   Frequency -- -- -- Add 3 additional days to program exercise sessions. Add 3 additional days to program exercise sessions.   Initial Home Exercises Provided -- -- -- 06/01/22 06/01/22     Oxygen   Maintain Oxygen Saturation -- 88% or higher 88% or higher -- 88% or higher    Row Name 06/13/22 1500 06/28/22 0700 07/13/22 1300 07/26/22 0900       Response to Exercise   Blood Pressure (Admit) 124/70 126/62 122/76 116/78    Blood Pressure (Exit) 120/68 130/80 118/78 114/70    Heart Rate (Admit) 78 bpm 71 bpm 93 bpm 75 bpm    Heart Rate (Exercise) 92 bpm 87 bpm 84 bpm 88 bpm    Heart Rate (Exit) 81 bpm 83 bpm 79 bpm 73 bpm    Oxygen Saturation (Admit) 95 % 96 % 92 % 95 %    Oxygen Saturation (Exercise) 93 % 93 % 94 % 92 %    Oxygen Saturation (Exit) 96 % 95 % 97 % 97 %    Rating of Perceived Exertion (Exercise) 15 15 13 12     Perceived Dyspnea (Exercise) 2 2 2 2     Symptoms SOB SOB SOB SOB    Duration Continue with 30 min of aerobic exercise without signs/symptoms of physical distress. Continue with 30 min of aerobic exercise without signs/symptoms of physical distress. Continue with 30 min of aerobic exercise without signs/symptoms of physical distress. Continue with 30 min of aerobic exercise without signs/symptoms of physical distress.    Intensity THRR unchanged THRR unchanged THRR unchanged THRR unchanged      Progression   Progression Continue to progress workloads to maintain intensity without signs/symptoms of physical distress. Continue to progress workloads to maintain intensity without signs/symptoms of physical distress. Continue to progress workloads to maintain intensity without signs/symptoms of physical distress. Continue to progress workloads to maintain intensity without signs/symptoms of physical distress.    Average METs 2 2.18 2.35 2.18      Resistance Training   Training Prescription Yes Yes Yes Yes    Weight 3 lb 3 lb 3 lb 3 lb    Reps 10-15 10-15  10-15 10-15      Interval Training   Interval Training No No No No      NuStep   Level 1 3 2 3     Minutes 30 30 30 30     METs 2.9 2.5 2.7 2.4      Biostep-RELP   Level 2 2 2 2     Minutes 30 30 30 30     METs 2 2 2 2       Home Exercise Plan   Plans to continue exercise at Home (comment)  walking,  recumbent bike Home (comment)  walking,  recumbent bike Home (comment)  walking,  recumbent bike Home (comment)  walking,  recumbent bike    Frequency Add 3 additional days to program exercise sessions. Add 3 additional days  to program exercise sessions. Add 3 additional days to program exercise sessions. Add 3 additional days to program exercise sessions.    Initial Home Exercises Provided 06/01/22 06/01/22 06/01/22 06/01/22      Oxygen   Maintain Oxygen Saturation 88% or higher 88% or higher 88% or higher 88% or higher             Exercise Comments:   Exercise Goals and Review:   Exercise Goals     Row Name 04/12/22 1255             Exercise Goals   Increase Physical Activity Yes       Intervention Provide advice, education, support and counseling about physical activity/exercise needs.;Develop an individualized exercise prescription for aerobic and resistive training based on initial evaluation findings, risk stratification, comorbidities and participant's personal goals.       Expected Outcomes Short Term: Attend rehab on a regular basis to increase amount of physical activity.;Long Term: Add in home exercise to make exercise part of routine and to increase amount of physical activity.;Long Term: Exercising regularly at least 3-5 days a week.       Increase Strength and Stamina Yes       Intervention Provide advice, education, support and counseling about physical activity/exercise needs.;Develop an individualized exercise prescription for aerobic and resistive training based on initial evaluation findings, risk stratification, comorbidities and participant's personal goals.        Expected Outcomes Short Term: Increase workloads from initial exercise prescription for resistance, speed, and METs.;Short Term: Perform resistance training exercises routinely during rehab and add in resistance training at home;Long Term: Improve cardiorespiratory fitness, muscular endurance and strength as measured by increased METs and functional capacity ( )       Able to understand and use rate of perceived exertion (RPE) scale Yes       Intervention Provide education and explanation on how to use RPE scale       Expected Outcomes Short Term: Able to use RPE daily in rehab to express subjective intensity level;Long Term:  Able to use RPE to guide intensity level when exercising independently       Able to understand and use Dyspnea scale Yes       Intervention Provide education and explanation on how to use Dyspnea scale       Expected Outcomes Short Term: Able to use Dyspnea scale daily in rehab to express subjective sense of shortness of breath during exertion;Long Term: Able to use Dyspnea scale to guide intensity level when exercising independently       Knowledge and understanding of Target Heart Rate Range (THRR) Yes       Intervention Provide education and explanation of THRR including how the numbers were predicted and where they are located for reference       Expected Outcomes Short Term: Able to state/look up THRR;Short Term: Able to use daily as guideline for intensity in rehab;Long Term: Able to use THRR to govern intensity when exercising independently       Able to check pulse independently Yes       Intervention Provide education and demonstration on how to check pulse in carotid and radial arteries.;Review the importance of being able to check your own pulse for safety during independent exercise       Expected Outcomes Short Term: Able to explain why pulse checking is important during independent exercise;Long Term: Able to check pulse independently and accurately  Understanding of Exercise Prescription Yes       Intervention Provide education, explanation, and written materials on patient's individual exercise prescription       Expected Outcomes Short Term: Able to explain program exercise prescription;Long Term: Able to explain home exercise prescription to exercise independently                Exercise Goals Re-Evaluation :  Exercise Goals Re-Evaluation     Row Name 04/25/22 0842 05/04/22 1433 05/18/22 1209 05/23/22 0749 06/01/22 0819     Exercise Goal Re-Evaluation   Exercise Goals Review Understanding of Exercise Prescription;Knowledge and understanding of Target Heart Rate Range (THRR);Able to understand and use Dyspnea scale;Able to understand and use rate of perceived exertion (RPE) scale Increase Physical Activity;Increase Strength and Stamina;Understanding of Exercise Prescription Increase Physical Activity;Increase Strength and Stamina;Understanding of Exercise Prescription Increase Physical Activity;Increase Strength and Stamina;Understanding of Exercise Prescription Increase Physical Activity;Increase Strength and Stamina;Understanding of Exercise Prescription;Able to understand and use rate of perceived exertion (RPE) scale;Able to understand and use Dyspnea scale;Able to check pulse independently;Knowledge and understanding of Target Heart Rate Range (THRR)   Comments Reviewed RPE scale, THR and program prescription with pt today.  Pt voiced understanding and was given a copy of goals to take home. Obada is off to a good start in the program. He had an overall average MET level of 1.86 METs during his first two days of rehab. He also was able to tolerate 30 minutes on the T4 nustep at level 1. He tried to use the recumbent bike but had to switch back to the T4 nustep due to discomfort. We will continue to monitor his progress in the program. Al missed a couple sessions last review as he out town. He has been staying on the T4 Nustep as he is  limited on fitting on machines. He also is limited by his breathing when walking. He tried out walking a couple laps on the track, we hope to that improve over time. His oxygen has been stayinh above 88%. We will continue to monitor. Al is doing well in rehab.  He has been using his bike/seated elliptical at home.  He will usually go for about 15-20 min.  We talked about continuing to build up his time to 30 min.  He is trying to get there.  He does feel like his stamina is starting to get better. Reviewed home exercise with pt today.  Pt plans to walk and use recumbent bike at home for exercise.  Reviewed THR, pulse, RPE, sign and symptoms, pulse oximetery and when to call 911 or MD.  Also discussed weather considerations and indoor options.  Pt voiced understanding.   Expected Outcomes Short: Use RPE daily to regulate intensity. Long: Follow program prescription in THR. Short: Continue to follow current exercise prescription. Long: Continue to improve strength and stamina. Short: Slowly add in more laps on track Long: Continue to increase overall MET level and stamina Short: Continue to add time in on bike at home Long: Conitnue to improve stamina Short: Start to add in exercise more at home Long: Continue to improve stamina    Row Name 06/01/22 1413 06/06/22 0757 06/13/22 1559 06/28/22 0737 07/06/22 0750     Exercise Goal Re-Evaluation   Exercise Goals Review Increase Physical Activity;Increase Strength and Stamina;Understanding of Exercise Prescription Increase Physical Activity;Increase Strength and Stamina;Understanding of Exercise Prescription Increase Physical Activity;Increase Strength and Stamina;Understanding of Exercise Prescription Increase Physical Activity;Increase Strength and Stamina;Understanding of Exercise  Prescription Increase Physical Activity;Increase Strength and Stamina;Understanding of Exercise Prescription   Comments Leelen is doing well in rehab. He has continued to try and walk  the track but has been limited due to dizziness. He was able to improve to level 2 on the biostep and tolerated it for 30 minutes. He has continued to use 3 lb hand weights for resistance training as well. We will continue to monitor his progress in the program. Al states he has been using his recumbent bike for exercise. He uses it for about 15 minutes at a time, we talked about slowly accumulating his time up, and taking breaks when needed to. He practices PLB when he needs to and checks his O2, he states his O2 stays above 88%.  He continues to check his HR. He is using light therapy to help with neuropathy 2x/day for 90 days. He has seen an improvement so far and is hoping it may help improve his blaance and walking. Al continues to come to rehab, has missed a couple sessions due to transportation issues. He usually walks to and from rehab and stays on seated machines while at rehab. He has been working at level 2 on [employer] but continues to exercise at level 1 on the T4 Nustep. He would benefit from also increasing that to level 2. He is still limited by his SOB when exercising, with an average RPE of 15. We will continue to monitor. Al is doing well in rehab, although he has only attended rehab twic since the last review. He increased his overall average MET level to 2.18 METs. He also improved to level 3 on the T4 nustep and continued to work at level 2 on the biostep. His O2 saturations have stayed above 93% during exercise since the last review as well. We will continue to monitor his progress in the program. Al is doing well in rehab. He is up to 15-69min on his recumbent bike at home.  His stamina is not getting too much better.  He feels he has a lot to get done, but not able to do it all.  He is also doing PT and neurorehab for nerves in his legs.   Expected Outcomes Short: Continue to push for more laps on the track. Long: Continue to improve strength and stamina. Short: Slowly build up tolerance  on recumbent bike, monitor HR and O2 during Long: Continue independent exercise at home at appropriate prescription Short: Increase to level 2 on the Nustep Long: Continue to increase overall MET level and stamina Short: Continue to progressively increase workloads. Long: Continue to increase strength and stamina. Short: Continue to rehab nerves Long: Conitnue to exercise independent    Row Name 07/13/22 1312 07/26/22 0915           Exercise Goal Re-Evaluation   Exercise Goals Review Increase Physical Activity;Increase Strength and Stamina;Understanding of Exercise Prescription Increase Physical Activity;Increase Strength and Stamina;Understanding of Exercise Prescription      Comments Al is doing wellin rehab.  He does 30 min on NuStep or BioStep each day and is now on level 2 for the full time.  We will continue to monitor his progress. Al is doing wellin rehab. He continues to do well with 30 min on the T4 nustep or biostep each day. He also improved back up to level 3 on the T4 nustep, while continuing to work at level 2 on the biostep. He also has stayed consistent with 3 lb hand weights for  resistance training. We will continue to monitor his progress in the program.      Expected Outcomes Short: Continue to increase workloads for longer periods of time Long: continue to improve stamina Short: Continue to progressively increase workloads for longer periods of time. Long: Continue to improve strength and stamina.               Discharge Exercise Prescription (Final Exercise Prescription Changes):  Exercise Prescription Changes - 07/26/22 0900       Response to Exercise   Blood Pressure (Admit) 116/78    Blood Pressure (Exit) 114/70    Heart Rate (Admit) 75 bpm    Heart Rate (Exercise) 88 bpm    Heart Rate (Exit) 73 bpm    Oxygen Saturation (Admit) 95 %    Oxygen Saturation (Exercise) 92 %    Oxygen Saturation (Exit) 97 %    Rating of Perceived Exertion (Exercise) 12    Perceived  Dyspnea (Exercise) 2    Symptoms SOB    Duration Continue with 30 min of aerobic exercise without signs/symptoms of physical distress.    Intensity THRR unchanged      Progression   Progression Continue to progress workloads to maintain intensity without signs/symptoms of physical distress.    Average METs 2.18      Resistance Training   Training Prescription Yes    Weight 3 lb    Reps 10-15      Interval Training   Interval Training No      NuStep   Level 3    Minutes 30    METs 2.4      Biostep-RELP   Level 2    Minutes 30    METs 2      Home Exercise Plan   Plans to continue exercise at Home (comment)   walking,  recumbent bike   Frequency Add 3 additional days to program exercise sessions.    Initial Home Exercises Provided 06/01/22      Oxygen   Maintain Oxygen Saturation 88% or higher             Nutrition:  Target Goals: Understanding of nutrition guidelines, daily intake of sodium 1500mg , cholesterol 200mg , calories 30% from fat and 7% or less from saturated fats, daily to have 5 or more servings of fruits and vegetables.  Education: All About Nutrition: -Group instruction provided by verbal, written material, interactive activities, discussions, models, and posters to present general guidelines for heart healthy nutrition including fat, fiber, MyPlate, the role of sodium in heart healthy nutrition, utilization of the nutrition label, and utilization of this knowledge for meal planning. Follow up email sent as well. Written material given at graduation. Flowsheet Row Pulmonary Rehab from [facility]/15/2022 in [facility]  Education need identified 04/12/22       Biometrics:  Pre Biometrics - 04/12/22 1256       Pre Biometrics   Height 5\' 10"  (1.778 m)    Weight 219 lb 11.2 oz (99.7 kg)    Waist Circumference 41 inches    Hip Circumference 40 inches    Waist to Hip Ratio 1.03 %    BMI (Calculated) 31.52    Single Leg Stand 1.4 seconds               Nutrition Therapy Plan and Nutrition Goals:  Nutrition Therapy & Goals - 06/06/22 0825       Nutrition Therapy   RD appointment deferred Yes  Nutrition Assessments:  MEDIFICTS Score Key: ?70 Need to make dietary changes  40-70 Heart Healthy Diet ? 40 Therapeutic Level Cholesterol Diet  Flowsheet Row Pulmonary Rehab from 04/12/2022 in [facility] Cardiac and Pulmonary Rehab  Picture Your Plate Total Score on Admission 44      Picture Your Plate Scores: <78 Unhealthy dietary pattern with much room for improvement. 41-50 Dietary pattern unlikely to meet recommendations for good health and room for improvement. 51-60 More healthful dietary pattern, with some room for improvement.  >60 Healthy dietary pattern, although there may be some specific behaviors that could be improved.   Nutrition Goals Re-Evaluation:  Nutrition Goals Re-Evaluation     Row Name 05/23/22 0751 06/06/22 0811 07/06/22 0759         Goals   Nutrition Goal Work on mechanical eating -- Short: Continue to work on [employer] Long: Continue to eat pulmonary healthy based diet     Comment Al says that he is not hungry often but is eating some.  We talked about getting in at least two meals a day.  We talked about using meal replacement bars/shakes to help.  We talked about making sure he gets in enough calories each day. Patient has deferred talking to a RD at this time. He is working on trying to eat several smaller meals/ day and eating enough calories and macronutrients to meet his needs. Al is doing well in rehab.  He is eating small meals but feels that has become his new normal. He is trying to get in protein and varitey.     Expected Outcome Short: Try meal replacement options Long: conitnue to improve diet Short: Continue to work on [employer] Long: Continue to eat pulmonary healthy based diet Continue to work on eating around the clock.               Nutrition Goals Discharge (Final Nutrition Goals Re-Evaluation):  Nutrition Goals Re-Evaluation - 07/06/22 0759       Goals   Nutrition Goal Short: Continue to work on smaller/frequent meals Long: Continue to eat pulmonary healthy based diet    Comment Al is doing well in rehab.  He is eating small meals but feels that has become his new normal. He is trying to get in protein and varitey.    Expected Outcome Continue to work on eating around the clock.             Psychosocial: Target Goals: Acknowledge presence or absence of significant depression and/or stress, maximize coping skills, provide positive support system. Participant is able to verbalize types and ability to use techniques and skills needed for reducing stress and depression.   Education: Stress, Anxiety, and Depression - Group verbal and visual presentation to define topics covered.  Reviews how body is impacted by stress, anxiety, and depression.  Also discusses healthy ways to reduce stress and to treat/manage anxiety and depression.  Written material given at graduation. Flowsheet Row Pulmonary Rehab from 06/15/2022 in [facility] Cardiac and Pulmonary Rehab  Date 04/27/22  Educator [facility]  Instruction Review Code 1- [employer] Understanding       Education: Sleep Hygiene -Provides group verbal and written instruction about how sleep can affect your health.  Define sleep hygiene, discuss sleep cycles and impact of sleep habits. Review good sleep hygiene tips.    Initial Review & Psychosocial Screening:  Initial Psych Review & Screening - 04/10/22 2956       Initial Review   Current issues with  Current Anxiety/Panic;History of Depression;Current Psychotropic Meds;Current Sleep Concerns      Family Dynamics   Good Support System? Yes    Comments Erique states that his health and pain levels are the source of his depression and anxiety. He states he feels bad all the time. Al can look to his wife, son and other  family for support.      Barriers   Psychosocial barriers to participate in program The patient should benefit from training in stress management and relaxation.      Screening Interventions   Interventions Encouraged to exercise;To provide support and resources with identified psychosocial needs;Provide feedback about the scores to participant    Expected Outcomes Short Term goal: Utilizing psychosocial counselor, staff and physician to assist with identification of specific Stressors or current issues interfering with healing process. Setting desired goal for each stressor or current issue identified.;Long Term Goal: Stressors or current issues are controlled or eliminated.;Short Term goal: Identification and review with participant of any Quality of Life or Depression concerns found by scoring the questionnaire.;Long Term goal: The participant improves quality of Life and PHQ9 Scores as seen by post scores and/or verbalization of changes             Quality of Life Scores:  Scores of 19 and below usually indicate a poorer quality of life in these areas.  A difference of  2-3 points is a clinically meaningful difference.  A difference of 2-3 points in the total score of the Quality of Life Index has been associated with significant improvement in overall quality of life, self-image, physical symptoms, and general health in studies assessing change in quality of life.  PHQ-9: Review Flowsheet       05/11/2022 04/12/2022  Depression screen PHQ 2/9  Decreased Interest 1 2  Down, Depressed, Hopeless 1 1  PHQ - 2 Score 2 3  Altered sleeping 3 2  Tired, decreased energy 3 2  Change in appetite 3 2  Feeling bad or failure about yourself  1 0  Trouble concentrating 0 0  Moving slowly or fidgety/restless 0 1  Suicidal thoughts 0 0  PHQ-9 Score 12 10  Difficult doing work/chores Somewhat difficult Somewhat difficult   Interpretation of Total Score  Total Score Depression Severity:  1-4  = Minimal depression, 5-9 = Mild depression, 10-14 = Moderate depression, 15-19 = Moderately severe depression, 20-27 = Severe depression   Psychosocial Evaluation and Intervention:  Psychosocial Evaluation - 04/10/22 0958       Psychosocial Evaluation & Interventions   Interventions Encouraged to exercise with the program and follow exercise prescription;Relaxation education;Stress management education    Comments Zayden states that his health and pain levels are the source of his depression and anxiety. He states he feels bad all the time. Al can look to his wife, son and other family for support.    Expected Outcomes Short: Start [facility] to help with mood. Long: Maintain a healthy mental state.    Continue Psychosocial Services  Follow up required by staff             Psychosocial Re-Evaluation:  Psychosocial Re-Evaluation     Row Name 05/11/22 0809 06/06/22 0813 07/06/22 0981         Psychosocial Re-Evaluation   Current issues with Current Sleep Concerns Current Stress Concerns Current Stress Concerns     Comments Reviewed patient health questionnaire (PHQ-9) with patient for follow up. Previously, patients score indicated signs/symptoms of depression.  Reviewed to see  if patient is improving symptom wise while in program.  Score declined and patient states that it is because he hasnt been sleeping well and has little energy. Al just completed a new sleep study this last Sunday to get a new CPAP, getting fit for a new mask. He is still waiting on the results. Wakes up from chronic pain at night which he following pain management. His biggest stressor is that his roof is leaking and may need a new one costing him a financial burden. Coming to rehab helps him get his mind off of things and hopes to continue to see improvement. Al is doing well in rehab.  He is still getting frustruted with not being able to get every thing done.  He is going to pulm rehab and neuro rehab as well.  He  is still taking his supplements. He is still waiting on his sleep study to be reviewed with him to get new mask. He is still in process of getting new roof put on and he is still paying for it as well.  He is getting in his exercise and hoping to feel better once it is all done.     Expected Outcomes Short: Continue to work toward an improvement in PHQ9 scores by attending [facility] regularly. Long: Continue to improve stress and depression coping skills by talking with staff and attending [facility] regularly and work toward a positive mental state. Short: Get results from sleep study Long: Continue to utilize exercise for stress management and positive attitude Short: Continue to try to hear back about sleep study Long: Conitnue to improve stamina and exercise for mental boost     Interventions Encouraged to attend Pulmonary Rehabilitation for the exercise Encouraged to attend Pulmonary Rehabilitation for the exercise Encouraged to attend Pulmonary Rehabilitation for the exercise     Continue Psychosocial Services  Follow up required by staff Follow up required by staff Follow up required by staff              Psychosocial Discharge (Final Psychosocial Re-Evaluation):  Psychosocial Re-Evaluation - 07/06/22 1610       Psychosocial Re-Evaluation   Current issues with Current Stress Concerns    Comments Al is doing well in rehab.  He is still getting frustruted with not being able to get every thing done.  He is going to pulm rehab and neuro rehab as well.  He is still taking his supplements. He is still waiting on his sleep study to be reviewed with him to get new mask. He is still in process of getting new roof put on and he is still paying for it as well.  He is getting in his exercise and hoping to feel better once it is all done.    Expected Outcomes Short: Continue to try to hear back about sleep study Long: Conitnue to improve stamina and exercise for mental boost    Interventions Encouraged  to attend Pulmonary Rehabilitation for the exercise    Continue Psychosocial Services  Follow up required by staff             Education: Education Goals: Education classes will be provided on a weekly basis, covering required topics. Participant will state understanding/return demonstration of topics presented.  Learning Barriers/Preferences:  Learning Barriers/Preferences - 04/12/22 1257       Learning Barriers/Preferences   Learning Barriers Hearing    Learning Preferences None             General Pulmonary Education  Topics:  Infection Prevention: - Provides verbal and written material to individual with discussion of infection control including proper hand washing and proper equipment cleaning during exercise session. Flowsheet Row Pulmonary Rehab from 06/15/2022 in [facility] Cardiac and Pulmonary Rehab  Date 04/10/22  Educator [facility]  Instruction Review Code 1- Verbalizes Understanding       Falls Prevention: - Provides verbal and written material to individual with discussion of falls prevention and safety. Flowsheet Row Pulmonary Rehab from 06/15/2022 in [facility] Cardiac and Pulmonary Rehab  Date 04/10/22  Educator [facility]  Instruction Review Code 1- Verbalizes Understanding       Chronic Lung Disease Review: - Group verbal instruction with posters, models, PowerPoint presentations and videos,  to review new updates, new respiratory medications, new advancements in procedures and treatments. Providing information on websites and "800" numbers for continued self-education. Includes information about supplement oxygen, available portable oxygen systems, continuous and intermittent flow rates, oxygen safety, concentrators, and Medicare reimbursement for oxygen. Explanation of Pulmonary Drugs, including class, frequency, complications, importance of spacers, rinsing mouth after steroid MDI's, and proper cleaning methods for nebulizers. Review of basic lung anatomy and physiology related  to function, structure, and complications of lung disease. Review of risk factors. Discussion about methods for diagnosing sleep apnea and types of masks and machines for OSA. Includes a review of the use of types of environmental controls: home humidity, furnaces, filters, dust mite/pet prevention, HEPA vacuums. Discussion about weather changes, air quality and the benefits of nasal washing. Instruction on Warning signs, infection symptoms, calling MD promptly, preventive modes, and value of vaccinations. Review of effective airway clearance, coughing and/or vibration techniques. Emphasizing that all should Create an Action Plan. Written material given at graduation. Flowsheet Row Pulmonary Rehab from 06/15/2022 in [facility] Cardiac and Pulmonary Rehab  Education need identified 04/12/22       AED/CPR: - Group verbal and written instruction with the use of models to demonstrate the basic use of the AED with the basic ABC's of resuscitation.    Anatomy and Cardiac Procedures: - Group verbal and visual presentation and models provide information about basic cardiac anatomy and function. Reviews the testing methods done to diagnose heart disease and the outcomes of the test results. Describes the treatment choices: Medical Management, Angioplasty, or Coronary Bypass Surgery for treating various heart conditions including Myocardial Infarction, Angina, Valve Disease, and Cardiac Arrhythmias.  Written material given at graduation.   Medication Safety: - Group verbal and visual instruction to review commonly prescribed medications for heart and lung disease. Reviews the medication, class of the drug, and side effects. Includes the steps to properly store meds and maintain the prescription regimen.  Written material given at graduation. Flowsheet Row Pulmonary Rehab from 06/15/2022 in [facility] Cardiac and Pulmonary Rehab  Date 06/15/22  Educator [facility]  Instruction Review Code 1- Verbalizes Understanding        Other: -Provides group and verbal instruction on various topics (see comments)   Knowledge Questionnaire Score:  Knowledge Questionnaire Score - 04/12/22 1257       Knowledge Questionnaire Score   Pre Score 11/18              Core Components/Risk Factors/Patient Goals at Admission:  Personal Goals and Risk Factors at Admission - 04/12/22 1257       Core Components/Risk Factors/Patient Goals on Admission    Weight Management Yes;Weight Loss;Obesity    Intervention Weight Management: Develop a combined nutrition and exercise program designed to reach desired caloric intake, while  maintaining appropriate intake of nutrient and fiber, sodium and fats, and appropriate energy expenditure required for the weight goal.;Weight Management: Provide education and appropriate resources to help participant work on and attain dietary goals.;Weight Management/Obesity: Establish reasonable short term and long term weight goals.;Obesity: Provide education and appropriate resources to help participant work on and attain dietary goals.    Admit Weight 219 lb 11.2 oz (99.7 kg)    Goal Weight: Short Term 214 lb (97.1 kg)    Goal Weight: Long Term 200 lb (90.7 kg)    Expected Outcomes Short Term: Continue to assess and modify interventions until short term weight is achieved;Long Term: Adherence to nutrition and physical activity/exercise program aimed toward attainment of established weight goal;Weight Loss: Understanding of general recommendations for a balanced deficit meal plan, which promotes 1-2 lb weight loss per week and includes a negative energy balance of [phone] kcal/d;Understanding recommendations for meals to include 15-35% energy as protein, 25-35% energy from fat, 35-60% energy from carbohydrates, less than 200mg  of dietary cholesterol, 20-35 gm of total fiber daily;Understanding of distribution of calorie intake throughout the day with the consumption of 4-5 meals/snacks    Improve  shortness of breath with ADL's Yes    Intervention Provide education, individualized exercise plan and daily activity instruction to help decrease symptoms of SOB with activities of daily living.    Expected Outcomes Short Term: Improve cardiorespiratory fitness to achieve a reduction of symptoms when performing ADLs;Long Term: Be able to perform more ADLs without symptoms or delay the onset of symptoms    Increase knowledge of respiratory medications and ability to use respiratory devices properly  Yes    Intervention Provide education and demonstration as needed of appropriate use of medications, inhalers, and oxygen therapy.    Expected Outcomes Short Term: Achieves understanding of medications use. Understands that oxygen is a medication prescribed by physician. Demonstrates appropriate use of inhaler and oxygen therapy.;Long Term: Maintain appropriate use of medications, inhalers, and oxygen therapy.    Diabetes Yes    Intervention Provide education about signs/symptoms and action to take for hypo/hyperglycemia.;Provide education about proper nutrition, including hydration, and aerobic/resistive exercise prescription along with prescribed medications to achieve blood glucose in normal ranges: Fasting glucose 65-99 mg/dL    Expected Outcomes Short Term: Participant verbalizes understanding of the signs/symptoms and immediate care of hyper/hypoglycemia, proper foot care and importance of medication, aerobic/resistive exercise and nutrition plan for blood glucose control.;Long Term: Attainment of HbA1C < 7%.    Hypertension Yes    Intervention Provide education on lifestyle modifcations including regular physical activity/exercise, weight management, moderate sodium restriction and increased consumption of fresh fruit, vegetables, and low fat dairy, alcohol moderation, and smoking cessation.;Monitor prescription use compliance.    Expected Outcomes Short Term: Continued assessment and intervention until  BP is < 140/35mm HG in hypertensive participants. < 130/58mm HG in hypertensive participants with diabetes, heart failure or chronic kidney disease.;Long Term: Maintenance of blood pressure at goal levels.    Intervention Provide education and support for participant on nutrition & aerobic/resistive exercise along with prescribed medications to achieve LDL 70mg , HDL >40mg .    Expected Outcomes Short Term: Participant states understanding of desired cholesterol values and is compliant with medications prescribed. Participant is following exercise prescription and nutrition guidelines.;Long Term: Cholesterol controlled with medications as prescribed, with individualized exercise RX and with personalized nutrition plan. Value goals: LDL < 70mg , HDL > 40 mg.             Education:Diabetes -  Individual verbal and written instruction to review signs/symptoms of diabetes, desired ranges of glucose level fasting, after meals and with exercise. Acknowledge that pre and post exercise glucose checks will be done for 3 sessions at entry of program. Flowsheet Row Pulmonary Rehab from 06/15/2022 in [facility] Cardiac and Pulmonary Rehab  Date 04/10/22  Educator [facility]  Instruction Review Code 1- Verbalizes Understanding       Know Your Numbers and Heart Failure: - Group verbal and visual instruction to discuss disease risk factors for cardiac and pulmonary disease and treatment options.  Reviews associated critical values for Overweight/Obesity, Hypertension, Cholesterol, and Diabetes.  Discusses basics of heart failure: signs/symptoms and treatments.  Introduces Heart Failure Zone chart for action plan for heart failure.  Written material given at graduation.   Core Components/Risk Factors/Patient Goals Review:   Goals and Risk Factor Review     Row Name 05/23/22 0753 06/06/22 0805 07/06/22 0801         Core Components/Risk Factors/Patient Goals Review   Personal Goals Review Weight  Management/Obesity;Improve shortness of breath with ADL's;Increase knowledge of respiratory medications and ability to use respiratory devices properly.;Diabetes;Hypertension Weight Management/Obesity;Improve shortness of breath with ADL's;Increase knowledge of respiratory medications and ability to use respiratory devices properly.;Diabetes;Hypertension Weight Management/Obesity;Improve shortness of breath with ADL's;Increase knowledge of respiratory medications and ability to use respiratory devices properly.;Diabetes;Hypertension     Review Al is doing well in rehab.  His weight is trending down and is down to 211 here and 205 at home.  He is pleased with his progress.  His blood pressures are doing well haning around 130s/80s.  His sugars have been around 130s as well.  He checks both daily at home.  His breathing is rough today.  Overall, it has been getting better. He is using his inhaler and nebulizer each day. Al states he feels like he's doing a lot at home at the moment as he is participating in light therapy for his neuropathy in addition to his home exercise. He has good and bad breathing days and continues to monitor his O2 and using his oxygen prn. He is taking all medications OK. Continues to use his inhaler and nebulizer. He is now able to walk down here using his walker. BP has been running nicely at home and rehab, ranging around 120/70s. He checks it daily. So far he has maintained his weight. Overall he has lost 20 lbs in 3 months from Ozempic and wants to be under 200 lb. Today he was around 210 lb. He checks his  blood sugar every morning which is ranging around 130 every day, where his MD wants it to be. Al is doing well in rehab.  His weight goes up and down, but he thinks that it is trending down overall.  He is down 20 lb since all of this started.  His pressures are doing well and his sugars continue to do well.  He is good about using his inhalers.  His breathing is still hanging around  about the same, but his is also limited his pain too. He is doing well with using his PLB.     Expected Outcomes Short: Continue to use breathing treatments on bad breathing days Long: Conitnue to work on weight loss Short: Continue to work on weight loss and monitor BP/BS Long: Work toward weight goal under 200 lb Short: Continue to work on [employer] loss Long: Continue to monitor risk factors.  Core Components/Risk Factors/Patient Goals at Discharge (Final Review):   Goals and Risk Factor Review - 07/06/22 0801       Core Components/Risk Factors/Patient Goals Review   Personal Goals Review Weight Management/Obesity;Improve shortness of breath with ADL's;Increase knowledge of respiratory medications and ability to use respiratory devices properly.;Diabetes;Hypertension    Review Al is doing well in rehab.  His weight goes up and down, but he thinks that it is trending down overall.  He is down 20 lb since all of this started.  His pressures are doing well and his sugars continue to do well.  He is good about using his inhalers.  His breathing is still hanging around about the same, but his is also limited his pain too. He is doing well with using his PLB.    Expected Outcomes Short: Continue to work on weight loss Long: Continue to monitor risk factors.             ITP Comments:  ITP Comments     Row Name 04/10/22 0981 04/12/22 1141 04/25/22 0840 05/03/22 0927 05/31/22 1349   ITP Comments Virtual Visit completed. Patient informed on EP and RD appointment and 6 Minute walk test. Patient also informed of patient health questionnaires on My Chart. Patient Verbalizes understanding. Visit diagnosis can be found in [facility] 02/01/2022. Completed and gym orientation. Initial ITP created and sent for review to Dr. Jinny Sanders, Medical Director. First full day of exercise!  Patient was oriented to gym and equipment including functions, settings, policies, and procedures.  Patient's  individual exercise prescription and treatment plan were reviewed.  All starting workloads were established based on the results of the 6 minute walk test done at initial orientation visit.  The plan for exercise progression was also introduced and progression will be customized based on patient's performance and goals. 30 Day review completed. Medical Director ITP review done, changes made as directed, and signed approval by Medical Director.     new to program 30 day review completed. ITP sent to Dr. Jinny Sanders, Medical Director of  Pulmonary Rehab. Continue with ITP unless changes are made by physician.    Row Name 06/28/22 0934 07/26/22 1124         ITP Comments 30 Day review completed. Medical Director ITP review done, changes made as directed, and signed approval by Medical Director. 30 Day review completed. Medical Director ITP review done, changes made as directed, and signed approval by Medical Director.               Comments:

## 2022-07-27 ENCOUNTER — Encounter: Payer: Medicare Other | Admitting: *Deleted

## 2022-08-01 DIAGNOSIS — D869 Sarcoidosis, unspecified: Secondary | ICD-10-CM | POA: Diagnosis not present

## 2022-08-01 NOTE — Progress Notes (Signed)
Daily Session Note  Patient Details  Name: Ethan Frye MRN: [id number] Date of Birth: 12-30-1950 Referring Provider:   Flowsheet Row Pulmonary Rehab from 04/12/2022 in [facility] Cardiac and Pulmonary Rehab  Referring Provider Landry Dyke MD       Encounter Date: 08/01/2022  Check In:  Session Check In - 08/01/22 0743       Check-In   Supervising physician immediately available to respond to emergencies See telemetry face sheet for immediately available ER MD    Location [facility]-Cardiac & Pulmonary Rehab    Staff Present Darcel Bayley, RN,BC,MSN;Susanne Bice, RN, BSN, CCRP    Virtual Visit No    Medication changes reported     No    Fall or balance concerns reported    No    Tobacco Cessation No Change    Warm-up and Cool-down Performed on first and last piece of equipment    Resistance Training Performed Yes    VAD Patient? No    PAD/SET Patient? No      Pain Assessment   Currently in Pain? No/denies    Multiple Pain Sites No                Social History   Tobacco Use  Smoking Status Former   Packs/day: 0.25   Years: 8.00   Additional pack years: 0.00   Total pack years: 2.00   Types: Cigarettes   Quit date: 03/16/1977   Years since quitting: 45.4   Passive exposure: Past  Smokeless Tobacco Former    Goals Met:  Independence with exercise equipment Exercise tolerated well No report of concerns or symptoms today  Goals Unmet:  Not Applicable  Comments: Pt able to follow exercise prescription today without complaint.  Will continue to monitor for progression.    Dr. Bethann Punches is Medical Director for [facility] Cardiac Rehabilitation.  Dr. Vida Rigger is Medical Director for [facility] Pulmonary Rehabilitation.

## 2022-08-03 ENCOUNTER — Encounter: Payer: Medicare Other | Admitting: *Deleted

## 2022-08-03 DIAGNOSIS — D869 Sarcoidosis, unspecified: Secondary | ICD-10-CM

## 2022-08-03 NOTE — Progress Notes (Signed)
Daily Session Note  Patient Details  Name: Ethan Frye MRN: [id number] Date of Birth: [birth year] Referring Provider:   Flowsheet Row Pulmonary Rehab from 04/12/2022 in [facility] Cardiac and Pulmonary Rehab  Referring Provider Landry Dyke MD       Encounter Date: 08/03/2022  Check In:  Session Check In - 08/03/22 0745       Check-In   Supervising physician immediately available to respond to emergencies See telemetry face sheet for immediately available ER MD    Location [facility]-Cardiac & Pulmonary Rehab    Staff Present Lanny Hurst, RN, ADN;Susanne Bice, RN, BSN, CCRP   [place], MS   Virtual Visit No    Medication changes reported     No    Fall or balance concerns reported    No    Warm-up and Cool-down Performed on first and last piece of equipment    Resistance Training Performed Yes    VAD Patient? No    PAD/SET Patient? No      Pain Assessment   Currently in Pain? No/denies                Social History   Tobacco Use  Smoking Status Former   Packs/day: 0.25   Years: 8.00   Additional pack years: 0.00   Total pack years: 2.00   Types: Cigarettes   Quit date: 03/16/1977   Years since quitting: 45.4   Passive exposure: Past  Smokeless Tobacco Former    Goals Met:  Independence with exercise equipment Exercise tolerated well No report of concerns or symptoms today Strength training completed today  Goals Unmet:  Not Applicable  Comments: Pt able to follow exercise prescription today without complaint.  Will continue to monitor for progression.    Dr. Bethann Punches is Medical Director for [facility] Cardiac Rehabilitation.  Dr. Vida Rigger is Medical Director for [facility] Pulmonary Rehabilitation.

## 2022-08-10 ENCOUNTER — Encounter: Payer: Medicare Other | Admitting: *Deleted

## 2022-08-15 ENCOUNTER — Encounter: Payer: Medicare Other | Attending: Pulmonary Disease | Admitting: *Deleted

## 2022-08-15 DIAGNOSIS — J42 Unspecified chronic bronchitis: Secondary | ICD-10-CM | POA: Diagnosis not present

## 2022-08-15 DIAGNOSIS — R0602 Shortness of breath: Secondary | ICD-10-CM | POA: Insufficient documentation

## 2022-08-15 DIAGNOSIS — Z87891 Personal history of nicotine dependence: Secondary | ICD-10-CM | POA: Diagnosis not present

## 2022-08-15 DIAGNOSIS — D869 Sarcoidosis, unspecified: Secondary | ICD-10-CM | POA: Insufficient documentation

## 2022-08-15 DIAGNOSIS — D860 Sarcoidosis of lung: Secondary | ICD-10-CM | POA: Insufficient documentation

## 2022-08-15 NOTE — Progress Notes (Signed)
Daily Session Note  Patient Details  Name: Ethan Frye MRN: [id number] Date of Birth: [birth year] Referring Provider:   Flowsheet Row Pulmonary Rehab from 04/12/2022 in [facility] Cardiac and Pulmonary Rehab  Referring Provider Landry Dyke MD       Encounter Date: 08/15/2022  Check In:  Session Check In - 08/15/22 0749       Check-In   Supervising physician immediately available to respond to emergencies See telemetry face sheet for immediately available ER MD    Location [facility]-Cardiac & Pulmonary Rehab    Staff Present Lanny Hurst, RN, ADN;Krista Karleen Hampshire RN, BSN;Other   [place], MS   Virtual Visit No    Medication changes reported     No    Fall or balance concerns reported    No    Warm-up and Cool-down Performed on first and last piece of equipment    Resistance Training Performed Yes    VAD Patient? No    PAD/SET Patient? No      Pain Assessment   Currently in Pain? No/denies                Social History   Tobacco Use  Smoking Status Former   Packs/day: 0.25   Years: 8.00   Additional pack years: 0.00   Total pack years: 2.00   Types: Cigarettes   Quit date: 03/16/1977   Years since quitting: 45.4   Passive exposure: Past  Smokeless Tobacco Former    Goals Met:  Independence with exercise equipment Exercise tolerated well No report of concerns or symptoms today Strength training completed today  Goals Unmet:  Not Applicable  Comments: Pt able to follow exercise prescription today without complaint.  Will continue to monitor for progression.    Dr. Bethann Punches is Medical Director for [facility] Cardiac Rehabilitation.  Dr. Vida Rigger is Medical Director for [facility] Pulmonary Rehabilitation.

## 2022-08-22 ENCOUNTER — Encounter: Payer: Medicare Other | Admitting: *Deleted

## 2022-08-22 ENCOUNTER — Encounter: Payer: Self-pay | Admitting: *Deleted

## 2022-08-22 DIAGNOSIS — D869 Sarcoidosis, unspecified: Secondary | ICD-10-CM

## 2022-08-22 DIAGNOSIS — D860 Sarcoidosis of lung: Secondary | ICD-10-CM | POA: Diagnosis not present

## 2022-08-22 NOTE — Progress Notes (Signed)
Daily Session Note  Patient Details  Name: Ethan Frye MRN: [id number] Date of Birth: [birth year] Referring Provider:   Flowsheet Row Pulmonary Rehab from 04/12/2022 in [facility] Cardiac and Pulmonary Rehab  Referring Provider Landry Dyke MD       Encounter Date: 08/22/2022  Check In:  Session Check In - 08/22/22 0811       Check-In   Supervising physician immediately available to respond to emergencies See telemetry face sheet for immediately available ER MD    Location [facility]-Cardiac & Pulmonary Rehab    Staff Present Cora Collum, RN, BSN, Matthew Saras, BS, ACSM CEP, Exercise Physiologist    Virtual Visit No    Medication changes reported     No    Fall or balance concerns reported    No    Warm-up and Cool-down Performed on first and last piece of equipment    Resistance Training Performed Yes    VAD Patient? No    PAD/SET Patient? No      Pain Assessment   Currently in Pain? No/denies                Social History   Tobacco Use  Smoking Status Former   Packs/day: 0.25   Years: 8.00   Additional pack years: 0.00   Total pack years: 2.00   Types: Cigarettes   Quit date: 03/16/1977   Years since quitting: 45.4   Passive exposure: Past  Smokeless Tobacco Former    Goals Met:  Proper associated with RPD/PD & O2 Sat Independence with exercise equipment Exercise tolerated well No report of concerns or symptoms today  Goals Unmet:  Not Applicable  Comments: Pt able to follow exercise prescription today without complaint.  Will continue to monitor for progression.    Dr. Bethann Punches is Medical Director for [facility] Cardiac Rehabilitation.  Dr. Vida Rigger is Medical Director for [facility] Pulmonary Rehabilitation.

## 2022-08-22 NOTE — Progress Notes (Signed)
Pulmonary Individual Treatment Plan  Patient Details  Name: Ethan Frye MRN: [id number] Date of Birth: January 28, 1951 Referring Provider:   Flowsheet Row Pulmonary Rehab from 04/12/2022 in [facility] Cardiac and Pulmonary Rehab  Referring Provider Landry Dyke MD       Initial Encounter Date:  Flowsheet Row Pulmonary Rehab from 04/12/2022 in [facility] Cardiac and Pulmonary Rehab  Date 04/12/22       Visit Diagnosis: Sarcoidosis  Patient's Home Medications on Admission:  Current Outpatient Medications:    acidophilus (RISAQUAD) CAPS capsule, Take 1 capsule by mouth daily., Disp: , Rfl:    albuterol (PROVENTIL) (2.5 MG/3ML) 0.083% nebulizer solution, Take 2.5 mg by nebulization every 6 (six) hours as needed for wheezing or shortness of breath. (Patient not taking: Reported on 04/10/2022), Disp: , Rfl:    albuterol (VENTOLIN HFA) 108 (90 Base) MCG/ACT inhaler, Inhale 2 puffs into the lungs every 6 (six) hours as needed for wheezing or shortness of breath., Disp: , Rfl:    aspirin EC 81 MG tablet, Take 81 mg by mouth daily. Swallow whole., Disp: , Rfl:    aspirin EC 81 MG tablet, Take by mouth. (Patient not taking: Reported on 04/10/2022), Disp: , Rfl:    atorvastatin (LIPITOR) 20 MG tablet, Take 20 mg by mouth daily. (Patient not taking: Reported on 04/10/2022), Disp: , Rfl:    atorvastatin (LIPITOR) 40 MG tablet, Take by mouth., Disp: , Rfl:    azithromycin (ZITHROMAX) 500 MG tablet, Take 500 mg by mouth every Monday, Wednesday, and Friday. (Patient not taking: Reported on 04/10/2022), Disp: , Rfl:    Blood Glucose Monitoring Suppl (FIFTY50 GLUCOSE METER 2.0) w/Device KIT, See admin instructions., Disp: , Rfl:    budesonide (PULMICORT) 0.5 MG/2ML nebulizer solution, Inhale into the lungs., Disp: , Rfl:    carvedilol (COREG) 25 MG tablet, Take 25 mg by mouth 2 (two) times daily. (Patient not taking: Reported on 04/10/2022), Disp: , Rfl:    carvedilol (COREG) 25 MG tablet, Take 1 tablet by mouth 2  (two) times daily with a meal., Disp: , Rfl:    celecoxib (CELEBREX) 200 MG capsule, Take 200 mg by mouth daily as needed for pain. (Patient not taking: Reported on 04/10/2022), Disp: , Rfl:    celecoxib (CELEBREX) 200 MG capsule, Take by mouth., Disp: , Rfl:    cetirizine (ZYRTEC) 10 MG tablet, Take 10 mg by mouth daily., Disp: , Rfl:    cholecalciferol (VITAMIN D) 25 MCG (1000 UNIT) tablet, Take 1,000 Units by mouth daily., Disp: , Rfl:    clonazePAM (KLONOPIN) 1 MG tablet, Take 1 mg by mouth 2 (two) times daily. (Patient not taking: Reported on 04/10/2022), Disp: , Rfl:    clopidogrel (PLAVIX) 75 MG tablet, clopidogrel 75 mg tablet  TAKE 1 TABLET (75 MG TOTAL) BY MOUTH ONCE DAILY., Disp: , Rfl:    Cysteamine Bitartrate (PROCYSBI) 300 MG PACK, Use 1 each 3 (three) times daily Use as instructed., Disp: , Rfl:    diclofenac Sodium (VOLTAREN) 1 % GEL, Apply 2 g topically 4 (four) times daily. (Patient not taking: Reported on 04/10/2022), Disp: , Rfl:    diclofenac Sodium (VOLTAREN) 1 % GEL, Apply 2 g topically 4 (four) times daily., Disp: , Rfl:    doxepin (SINEQUAN) 50 MG capsule, Take 150 mg by mouth at bedtime. (Patient not taking: Reported on 04/10/2022), Disp: , Rfl:    doxepin (SINEQUAN) 50 MG capsule, Take by mouth., Disp: , Rfl:    DULoxetine (CYMBALTA) 60 MG  capsule, Take 60 mg by mouth daily. (Patient not taking: Reported on 04/10/2022), Disp: , Rfl:    DULoxetine (CYMBALTA) 60 MG capsule, Take 1 tablet by mouth daily., Disp: , Rfl:    ferrous sulfate 325 (65 FE) MG tablet, Take 325 mg by mouth daily with breakfast., Disp: , Rfl:    fluticasone-salmeterol (ADVAIR) 500-50 MCG/ACT AEPB, Inhale 1 puff into the lungs every 12 (twelve) hours., Disp: , Rfl:    glucose blood (PRECISION QID TEST) test strip, Use 1 each (1 strip total) once daily Use as instructed., Disp: , Rfl:    HYDROcodone-acetaminophen (NORCO/VICODIN) 5-325 MG tablet, Take 1 tablet by mouth every 4 (four) hours as needed for pain.,  Disp: , Rfl:    ketoconazole (NIZORAL) 2 % cream, ketoconazole 2 % topical cream  APPLY TWICE DAILY TO RASH ON FACE AND GROIN, Disp: , Rfl:    lisinopril (ZESTRIL) 40 MG tablet, Take 40 mg by mouth daily., Disp: , Rfl:    losartan-hydrochlorothiazide (HYZAAR) 100-25 MG tablet, Take 1 tablet by mouth daily., Disp: , Rfl:    metFORMIN (GLUCOPHAGE) 500 MG tablet, Take 500-1,000 mg by mouth 2 (two) times daily with a meal. (Patient not taking: Reported on 04/10/2022), Disp: , Rfl:    metFORMIN (GLUCOPHAGE) 500 MG tablet, Take by mouth., Disp: , Rfl:    methadone (DOLOPHINE) 5 MG tablet, Take 2.5-5 mg by mouth 2 (two) times daily. Take 2.5 mg (one-half tablet) twice daily for one week then increase to 5 mg (one tablet) twice daily, thereafter, Disp: , Rfl:    montelukast (SINGULAIR) 10 MG tablet, Take 10 mg by mouth daily. (Patient not taking: Reported on 04/10/2022), Disp: , Rfl:    montelukast (SINGULAIR) 10 MG tablet, Take 1 tablet by mouth daily., Disp: , Rfl:    moxifloxacin (VIGAMOX) 0.5 % ophthalmic solution, moxifloxacin 0.5 % eye drops  PUT 1 DROP IN RIGHT EYE 4 TIMES A DAY FOR 7 DAYS OR AS DIRECTED, Disp: , Rfl:    Multiple Vitamins-Minerals (MULTIVITAMIN WITH MINERALS) tablet, Take 1 tablet by mouth daily., Disp: , Rfl:    naloxone (NARCAN) nasal spray 4 mg/0.1 mL, Place into the nose., Disp: , Rfl:    nitroGLYCERIN (NITROSTAT) 0.4 MG SL tablet, nitroglycerin 0.4 mg sublingual tablet  PLACE 1 TABLET UNDER THE TONGUE EVERY FOR CHEST PAIN, MAY TAKE UP TO 3 DOSES, Disp: , Rfl:    nystatin (MYCOSTATIN) 100000 UNIT/ML suspension, Take 2 mLs by mouth in the morning, at noon, in the evening, and at bedtime. (Patient not taking: Reported on 04/10/2022), Disp: , Rfl:    nystatin (MYCOSTATIN) 100000 UNIT/ML suspension, nystatin 100,000 unit/mL oral suspension  SWISH AND SWALLOW 5 MLS 4 (FOUR) TIMES DAILY., Disp: , Rfl:    omeprazole (PRILOSEC) 40 MG capsule, Take 40 mg by mouth daily. (Patient not  taking: Reported on 04/10/2022), Disp: , Rfl:    omeprazole (PRILOSEC) 40 MG capsule, Take 1 tablet by mouth daily., Disp: , Rfl:    oxyCODONE (OXY IR/ROXICODONE) 5 MG immediate release tablet, Take by mouth., Disp: , Rfl:    pimecrolimus (ELIDEL) 1 % cream, Apply 1 application topically 2 (two) times daily., Disp: , Rfl:    pimecrolimus (ELIDEL) 1 % cream, Elidel 1 % topical cream  APPLY TWICE DAILY TO AFFECTED AREA(S) FOR 1-2 WEEKS (Patient not taking: Reported on 04/10/2022), Disp: , Rfl:    polyethylene glycol (MIRALAX / GLYCOLAX) 17 g packet, Take 17 g by mouth 2 (two) times daily. (Patient  not taking: Reported on 04/10/2022), Disp: , Rfl:    polyethylene glycol powder (GLYCOLAX/MIRALAX) 17 GM/SCOOP powder, Take by mouth., Disp: , Rfl:    pregabalin (LYRICA) 75 MG capsule, Take 75 mg by mouth 3 (three) times daily., Disp: , Rfl:    Semaglutide,0.25 or 0.5MG /DOS, (OZEMPIC, 0.25 OR 0.5 MG/DOSE,) 2 MG/3ML SOPN, Inject into the skin., Disp: , Rfl:    senna-docusate (SENOKOT-S) 8.6-50 MG tablet, Take 1 tablet by mouth daily. (Patient not taking: Reported on 04/10/2022), Disp: , Rfl:    senna-docusate (SENOKOT-S) 8.6-50 MG tablet, Take 2 tablets by mouth daily., Disp: , Rfl:    spironolactone (ALDACTONE) 25 MG tablet, Take 12.5 mg by mouth daily., Disp: , Rfl:    SUMAtriptan (IMITREX) 50 MG tablet, Take 50 mg by mouth as directed., Disp: , Rfl:    tiZANidine (ZANAFLEX) 4 MG tablet, Take 4 mg by mouth 3 (three) times daily., Disp: , Rfl:    traMADol (ULTRAM) 50 MG tablet, Take 50 mg by mouth every 6 (six) hours as needed for pain., Disp: , Rfl:    vitamin B-12 (CYANOCOBALAMIN) 1000 MCG tablet, Take 1,000 mcg by mouth daily., Disp: , Rfl:   Past Medical History: No past medical history on file.  Tobacco Use: Social History   Tobacco Use  Smoking Status Former   Packs/day: 0.25   Years: 8.00   Additional pack years: 0.00   Total pack years: 2.00   Types: Cigarettes   Quit date: 03/16/1977    Years since quitting: 45.4   Passive exposure: Past  Smokeless Tobacco Former    Labs: Investment banker, operational       Latest Ref Rng & Units 11/04/2020 11/05/2020  Labs for ITP Cardiac and Pulmonary Rehab  Hemoglobin A1c 4.8 - 5.6 % - 6.2   PH, Arterial 7.350 - 7.450 7.24  7.34   PCO2 arterial 32.0 - 48.0 mmHg 44  37   Bicarbonate 20.0 - 28.0 mmol/L 20.2  18.9  20.0   Acid-base deficit 0.0 - 2.0 mmol/L 6.9  8.2  5.2   O2 Saturation % 75.8  89.5  99.2      Pulmonary Assessment Scores:  Pulmonary Assessment Scores     Row Name 04/12/22 1259         ADL UCSD   ADL Phase Entry     SOB Score total 91     Rest 3     Walk 4     Stairs 5     Bath 2     Dress 3     Shop 4       CAT Score   CAT Score 32       mMRC Score   mMRC Score 4              UCSD: Self-administered rating of dyspnea associated with activities of daily living (ADLs) 6-point scale (0 = "not at all" to 5 = "maximal or unable to do because of breathlessness")  Scoring Scores range from 0 to 120.  Minimally important difference is 5 units  CAT: CAT can identify the health impairment of COPD patients and is better correlated with disease progression.  CAT has a scoring range of zero to 40. The CAT score is classified into four groups of low (less than 10), medium (10 - 20), high (21-30) and very high (31-40) based on the impact level of disease on health status. A CAT score over 10 suggests significant symptoms.  A worsening CAT score  could be explained by an exacerbation, poor medication adherence, poor inhaler technique, or progression of COPD or comorbid conditions.  CAT MCID is 2 points  mMRC: mMRC (Modified Medical Research Council) Dyspnea Scale is used to assess the degree of baseline functional disability in patients of respiratory disease due to dyspnea. No minimal important difference is established. A decrease in score of 1 point or greater is considered a positive change.   Pulmonary Function  Assessment:  Pulmonary Function Assessment - 04/12/22 1259       Breath   Shortness of Breath Yes;Limiting activity;Panic with Shortness of Breath             Exercise Target Goals: Exercise Program Goal: Individual exercise prescription set using results from initial 6 min walk test and THRR while considering  patient's activity barriers and safety.   Exercise Prescription Goal: Initial exercise prescription builds to 30-45 minutes a day of aerobic activity, 2-3 days per week.  Home exercise guidelines will be given to patient during program as part of exercise prescription that the participant will acknowledge.  Education: Aerobic Exercise: - Group verbal and visual presentation on the components of exercise prescription. Introduces F.I.T.T principle from ACSM for exercise prescriptions.  Reviews F.I.T.T. principles of aerobic exercise including progression. Written material given at graduation. Flowsheet Row Pulmonary Rehab from 06/15/2022 in [facility] Cardiac and Pulmonary Rehab  Education need identified 04/12/22  Date 05/11/22  Educator [facility]  Instruction Review Code 1- Verbalizes Understanding       Education: Resistance Exercise: - Group verbal and visual presentation on the components of exercise prescription. Introduces F.I.T.T principle from ACSM for exercise prescriptions  Reviews F.I.T.T. principles of resistance exercise including progression. Written material given at graduation. Flowsheet Row Pulmonary Rehab from 06/15/2022 in [facility] Cardiac and Pulmonary Rehab  Date 05/18/22  Educator NT  Instruction Review Code 1- Verbalizes Understanding        Education: Exercise & Equipment Safety: - Individual verbal instruction and demonstration of equipment use and safety with use of the equipment. Flowsheet Row Pulmonary Rehab from 06/15/2022 in [facility] Cardiac and Pulmonary Rehab  Date 04/10/22  Educator [facility]  Instruction Review Code 1- Verbalizes Understanding       Education:  Exercise Physiology & General Exercise Guidelines: - Group verbal and written instruction with models to review the exercise physiology of the cardiovascular system and associated critical values. Provides general exercise guidelines with specific guidelines to those with heart or lung disease.    Education: Flexibility, Balance, Mind/Body Relaxation: - Group verbal and visual presentation with interactive activity on the components of exercise prescription. Introduces F.I.T.T principle from ACSM for exercise prescriptions. Reviews F.I.T.T. principles of flexibility and balance exercise training including progression. Also discusses the mind body connection.  Reviews various relaxation techniques to help reduce and manage stress (i.e. Deep breathing, progressive muscle relaxation, and visualization). Balance handout provided to take home. Written material given at graduation. Flowsheet Row Pulmonary Rehab from 06/15/2022 in [facility] Cardiac and Pulmonary Rehab  Date 05/18/22  Educator NT  Instruction Review Code 1- Verbalizes Understanding       Activity Barriers & Risk Stratification:  Activity Barriers & Cardiac Risk Stratification - 04/12/22 1154       Activity Barriers & Cardiac Risk Stratification   Activity Barriers Arthritis;Fibromyalgia;Joint Problems;Shortness of Breath;Muscular Weakness;Deconditioning;Balance Concerns;History of Falls;Assistive Device   chronic pain from fibromyalgia and RA in hands/shoulders/knees, fell at home on Sunday bruised left side  6 Minute Walk:  6 Minute Walk     Row Name 04/12/22 1141         6 Minute Walk   Phase Initial     Distance 840 feet     Walk Time 6 minutes     # of Rest Breaks 0     MPH 1.59     METS 1.86     RPE 17     Perceived Dyspnea  3     VO2 Peak 6.51     Symptoms Yes (comment)     Comments SOB, chronic hip pain     Resting HR 78 bpm     Resting BP 118/68     Resting Oxygen Saturation  94 %     Exercise  Oxygen Saturation  during 6 min walk 89 %     Max Ex. HR 92 bpm     Max Ex. BP 146/74     2 Minute Post BP 136/72       Interval HR   1 Minute HR 86     2 Minute HR 91     3 Minute HR 90     4 Minute HR 91     5 Minute HR 92     6 Minute HR 92     2 Minute Post HR 78     Interval Heart Rate? Yes       Interval Oxygen   Interval Oxygen? Yes     Baseline Oxygen Saturation % 94 %     1 Minute Oxygen Saturation % 90 %     1 Minute Liters of Oxygen 0 L  Room Air     2 Minute Oxygen Saturation % 90 %     2 Minute Liters of Oxygen 0 L     3 Minute Oxygen Saturation % 89 %     3 Minute Liters of Oxygen 0 L     4 Minute Oxygen Saturation % 89 %     4 Minute Liters of Oxygen 0 L     5 Minute Oxygen Saturation % 90 %     5 Minute Liters of Oxygen 0 L     6 Minute Oxygen Saturation % 90 %     6 Minute Liters of Oxygen 0 L     2 Minute Post Oxygen Saturation % 94 %     2 Minute Post Liters of Oxygen 0 L             Oxygen Initial Assessment:  Oxygen Initial Assessment - 06/06/22 0803       Home Oxygen   Home Oxygen Device Home Concentrator    Sleep Oxygen Prescription Continuous;CPAP   new sleep study completed, waiting on results   Home Exercise Oxygen Prescription Continuous   prn   Liters per minute 2    Home Resting Oxygen Prescription Continuous   prn   Liters per minute 2    Compliance with Home Oxygen Use Yes   only prn     Program Oxygen Prescription   Program Oxygen Prescription None   Can use O2 prn     Intervention   Short Term Goals To learn and exhibit compliance with exercise, home and travel O2 prescription;To learn and understand importance of monitoring SPO2 with pulse oximeter and demonstrate accurate use of the pulse oximeter.;To learn and understand importance of maintaining oxygen saturations>88%;To learn and demonstrate proper pursed lip breathing techniques or other breathing techniques. ;  To learn and demonstrate proper use of respiratory medications     Long  Term Goals Verbalizes importance of monitoring SPO2 with pulse oximeter and return demonstration;Exhibits proper breathing techniques, such as pursed lip breathing or other method taught during program session;Demonstrates proper use of MDI's;Compliance with respiratory medication;Maintenance of O2 saturations>88%;Exhibits compliance with exercise, home  and travel O2 prescription             Oxygen Re-Evaluation:  Oxygen Re-Evaluation     Row Name 04/25/22 0840 05/23/22 0756 06/06/22 0803 07/06/22 0804       Program Oxygen Prescription   Program Oxygen Prescription None None -- None      Home Oxygen   Home Oxygen Device Home Concentrator Home Concentrator -- Home Concentrator    Sleep Oxygen Prescription Continuous;CPAP Continuous;CPAP -- Continuous;CPAP    Liters per minute 0 0 -- 2    Home Exercise Oxygen Prescription -- Continuous -- None    Liters per minute 2 2 -- --    Home Resting Oxygen Prescription Continuous Continuous -- None    Liters per minute 2 2 -- --    Compliance with Home Oxygen Use -- No -- Yes  except CPAP, waiting for new mask      Goals/Expected Outcomes   Short Term Goals To learn and demonstrate proper pursed lip breathing techniques or other breathing techniques.  To learn and exhibit compliance with exercise, home and travel O2 prescription;To learn and understand importance of monitoring SPO2 with pulse oximeter and demonstrate accurate use of the pulse oximeter.;To learn and understand importance of maintaining oxygen saturations>88%;To learn and demonstrate proper pursed lip breathing techniques or other breathing techniques. ;To learn and demonstrate proper use of respiratory medications -- To learn and exhibit compliance with exercise, home and travel O2 prescription;To learn and understand importance of monitoring SPO2 with pulse oximeter and demonstrate accurate use of the pulse oximeter.;To learn and understand importance of maintaining oxygen  saturations>88%;To learn and demonstrate proper pursed lip breathing techniques or other breathing techniques. ;To learn and demonstrate proper use of respiratory medications    Long  Term Goals -- Verbalizes importance of monitoring SPO2 with pulse oximeter and return demonstration;Exhibits proper breathing techniques, such as pursed lip breathing or other method taught during program session;Demonstrates proper use of MDI's;Compliance with respiratory medication;Maintenance of O2 saturations>88%;Exhibits compliance with exercise, home  and travel O2 prescription -- Verbalizes importance of monitoring SPO2 with pulse oximeter and return demonstration;Exhibits proper breathing techniques, such as pursed lip breathing or other method taught during program session;Demonstrates proper use of MDI's;Compliance with respiratory medication;Maintenance of O2 saturations>88%;Exhibits compliance with exercise, home  and travel O2 prescription    Comments Reviewed PLB technique with pt.  Talked about how it works and it's importance in maintaining their exercise saturations. Al is scheduled for a sleep study as his equipment has not been working well for him and is out of date. He tried to use his CPAP but it was putting too much pressure on his teeth and nose.  He has been wearing his oxygen at home.  His saturations are doing pretty good overall.  Walking will still drop him on occassion.  He is doing well with his PLB. Al continues to do well. He just had a completed a sleep study this past Sunday and is awaiting the results. He is supposed to be getting a new mask this will fit him better with his CPAP. His oxygen saturations at rehab have been staying above 88%  on RA. He continues to check them at home and uses oxygen as needed. Continues to use PLB. Al has not been able to use his CPAP at night as his mask does not fit.  He is still waiting to hear back from doctor about it.  He is wearing oxygen at night.  He is not  needing his oxygen during the day except on occassion when he feels short of breath and then he will use it to help recover.  He is good about using his PLB and inhalers.    Goals/Expected Outcomes Short: Become more profiecient at using PLB. Long: Become independent at using PLB. Short; Get sleep study done Long; Continue to use PLB Short: Get results from sleep study and follow doctor orders on CPAP Long: Continue to monitor O2 and use PLB long-term Shrot: Continue to use O2 as needed Long: conitnue to use PLB             Oxygen Discharge (Final Oxygen Re-Evaluation):  Oxygen Re-Evaluation - 07/06/22 0804       Program Oxygen Prescription   Program Oxygen Prescription None      Home Oxygen   Home Oxygen Device Home Concentrator    Sleep Oxygen Prescription Continuous;CPAP    Liters per minute 2    Home Exercise Oxygen Prescription None    Home Resting Oxygen Prescription None    Compliance with Home Oxygen Use Yes   except CPAP, waiting for new mask     Goals/Expected Outcomes   Short Term Goals To learn and exhibit compliance with exercise, home and travel O2 prescription;To learn and understand importance of monitoring SPO2 with pulse oximeter and demonstrate accurate use of the pulse oximeter.;To learn and understand importance of maintaining oxygen saturations>88%;To learn and demonstrate proper pursed lip breathing techniques or other breathing techniques. ;To learn and demonstrate proper use of respiratory medications    Long  Term Goals Verbalizes importance of monitoring SPO2 with pulse oximeter and return demonstration;Exhibits proper breathing techniques, such as pursed lip breathing or other method taught during program session;Demonstrates proper use of MDI's;Compliance with respiratory medication;Maintenance of O2 saturations>88%;Exhibits compliance with exercise, home  and travel O2 prescription    Comments Al has not been able to use his CPAP at night as his mask does not  fit.  He is still waiting to hear back from doctor about it.  He is wearing oxygen at night.  He is not needing his oxygen during the day except on occassion when he feels short of breath and then he will use it to help recover.  He is good about using his PLB and inhalers.    Goals/Expected Outcomes Shrot: Continue to use O2 as needed Long: conitnue to use PLB             Initial Exercise Prescription:  Initial Exercise Prescription - 04/12/22 1100       Date of Initial Exercise RX and Referring Provider   Date 04/12/22    Referring Provider Landry Dyke MD      Oxygen   Oxygen --   may need oxygen is desaturates with exercise   Maintain Oxygen Saturation 88% or higher      Recumbant Bike   Level 1    RPM 50    Watts 10    Minutes 15    METs 1.8      T5 Nustep   Level 1    SPM 80    Minutes 15  METs 1.8      Biostep-RELP   Level 1    SPM 50    Minutes 15    METs 2      Track   Laps 21    Minutes 15    METs 2.14      Prescription Details   Frequency (times per week) 2    Duration Progress to 30 minutes of continuous aerobic without signs/symptoms of physical distress      Intensity   THRR 40-80% of Max Heartrate 106-135    Ratings of Perceived Exertion 11-13    Perceived Dyspnea 0-4      Progression   Progression Continue to progress workloads to maintain intensity without signs/symptoms of physical distress.      Resistance Training   Training Prescription Yes    Weight 3 lb    Reps 10-15             Perform Capillary Blood Glucose checks as needed.  Exercise Prescription Changes:   Exercise Prescription Changes     Row Name 04/12/22 1100 05/04/22 1400 05/18/22 1200 06/01/22 0800 06/01/22 1400     Response to Exercise   Blood Pressure (Admit) 118/68 126/70 104/66 -- 142/70   Blood Pressure (Exercise) 146/74 140/76 -- -- 136/76   Blood Pressure (Exit) 142/60 130/74 112/62 -- 124/62   Heart Rate (Admit) 78 bpm 88 bpm 72 bpm -- 72  bpm   Heart Rate (Exercise) 92 bpm 87 bpm 93 bpm -- 104 bpm   Heart Rate (Exit) 74 bpm 85 bpm 84 bpm -- 85 bpm   Oxygen Saturation (Admit) 94 % 90 % 96 % -- 94 %   Oxygen Saturation (Exercise) 89 % 92 % 91 % -- 90 %   Oxygen Saturation (Exit) 94 % 95 % 95 % -- 97 %   Rating of Perceived Exertion (Exercise) 17 15 16  -- 15   Perceived Dyspnea (Exercise) 3 3 3  -- 3   Symptoms SOB, hip pain none SOB -- SOB, dizziness   Comments walk test results First two days of exercise -- -- --   Duration -- Progress to 30 minutes of  aerobic without signs/symptoms of physical distress Progress to 30 minutes of  aerobic without signs/symptoms of physical distress -- Progress to 30 minutes of  aerobic without signs/symptoms of physical distress   Intensity -- THRR unchanged THRR unchanged -- THRR unchanged     Progression   Progression -- Continue to progress workloads to maintain intensity without signs/symptoms of physical distress. Continue to progress workloads to maintain intensity without signs/symptoms of physical distress. -- Continue to progress workloads to maintain intensity without signs/symptoms of physical distress.   Average METs -- 1.86 1.95 -- 1.67     Resistance Training   Training Prescription -- Yes Yes -- Yes   Weight -- 3 lb 3 lb -- 3 lb   Reps -- 10-15 10-15 -- 10-15     Interval Training   Interval Training -- No No -- No     Oxygen   Oxygen -- -- --  prn -- --     Recumbant Bike   Level -- 1 -- -- --   Watts -- 10 -- -- --   Minutes -- 15 -- -- --   METs -- 2.32 -- -- --     NuStep   Level -- 1 1 -- 1   Minutes -- 30 30 -- 15   METs -- 1.8 2 -- --  Biostep-RELP   Level -- -- 1 -- 2   Minutes -- -- 15 -- 30   METs -- -- 2 -- 2     Track   Laps -- -- 2  trying out to start -- 2  dizzy   Minutes -- -- 15 -- 15   METs -- -- 1 -- 1     Home Exercise Plan   Plans to continue exercise at -- -- -- Home (comment)  walking,  recumbent bike Home (comment)  walking,   recumbent bike   Frequency -- -- -- Add 3 additional days to program exercise sessions. Add 3 additional days to program exercise sessions.   Initial Home Exercises Provided -- -- -- 06/01/22 06/01/22     Oxygen   Maintain Oxygen Saturation -- 88% or higher 88% or higher -- 88% or higher    Row Name 06/13/22 1500 06/28/22 0700 07/13/22 1300 07/26/22 0900 08/10/22 1400     Response to Exercise   Blood Pressure (Admit) 124/70 126/62 122/76 116/78 118/78   Blood Pressure (Exit) 120/68 130/80 118/78 114/70 110/74   Heart Rate (Admit) 78 bpm 71 bpm 93 bpm 75 bpm 73 bpm   Heart Rate (Exercise) 92 bpm 87 bpm 84 bpm 88 bpm 89 bpm   Heart Rate (Exit) 81 bpm 83 bpm 79 bpm 73 bpm 73 bpm   Oxygen Saturation (Admit) 95 % 96 % 92 % 95 % 97 %   Oxygen Saturation (Exercise) 93 % 93 % 94 % 92 % 92 %   Oxygen Saturation (Exit) 96 % 95 % 97 % 97 % 97 %   Rating of Perceived Exertion (Exercise) 15 15 13 12 14    Perceived Dyspnea (Exercise) 2 2 2 2 2    Symptoms SOB SOB SOB SOB SOB   Duration Continue with 30 min of aerobic exercise without signs/symptoms of physical distress. Continue with 30 min of aerobic exercise without signs/symptoms of physical distress. Continue with 30 min of aerobic exercise without signs/symptoms of physical distress. Continue with 30 min of aerobic exercise without signs/symptoms of physical distress. Continue with 30 min of aerobic exercise without signs/symptoms of physical distress.   Intensity THRR unchanged THRR unchanged THRR unchanged THRR unchanged THRR unchanged     Progression   Progression Continue to progress workloads to maintain intensity without signs/symptoms of physical distress. Continue to progress workloads to maintain intensity without signs/symptoms of physical distress. Continue to progress workloads to maintain intensity without signs/symptoms of physical distress. Continue to progress workloads to maintain intensity without signs/symptoms of physical distress.  Continue to progress workloads to maintain intensity without signs/symptoms of physical distress.   Average METs 2 2.18 2.35 2.18 2.25     Resistance Training   Training Prescription Yes Yes Yes Yes Yes   Weight 3 lb 3 lb 3 lb 3 lb 3 lb   Reps 10-15 10-15 10-15 10-15 10-15     Interval Training   Interval Training No No No No No     NuStep   Level 1 3 2 3 2    Minutes 30 30 30 30 30    METs 2.9 2.5 2.7 2.4 2.6     Biostep-RELP   Level 2 2 2 2 2    Minutes 30 30 30 30 30    METs 2 2 2 2 2      Home Exercise Plan   Plans to continue exercise at Home (comment)  walking,  recumbent bike Home (comment)  walking,  recumbent bike  Home (comment)  walking,  recumbent bike Home (comment)  walking,  recumbent bike Home (comment)  walking,  recumbent bike   Frequency Add 3 additional days to program exercise sessions. Add 3 additional days to program exercise sessions. Add 3 additional days to program exercise sessions. Add 3 additional days to program exercise sessions. Add 3 additional days to program exercise sessions.   Initial Home Exercises Provided 06/01/22 06/01/22 06/01/22 06/01/22 06/01/22     Oxygen   Maintain Oxygen Saturation 88% or higher 88% or higher 88% or higher 88% or higher 88% or higher            Exercise Comments:   Exercise Goals and Review:   Exercise Goals     Row Name 04/12/22 1255             Exercise Goals   Increase Physical Activity Yes       Intervention Provide advice, education, support and counseling about physical activity/exercise needs.;Develop an individualized exercise prescription for aerobic and resistive training based on initial evaluation findings, risk stratification, comorbidities and participant's personal goals.       Expected Outcomes Short Term: Attend rehab on a regular basis to increase amount of physical activity.;Long Term: Add in home exercise to make exercise part of routine and to increase amount of physical activity.;Long  Term: Exercising regularly at least 3-5 days a week.       Increase Strength and Stamina Yes       Intervention Provide advice, education, support and counseling about physical activity/exercise needs.;Develop an individualized exercise prescription for aerobic and resistive training based on initial evaluation findings, risk stratification, comorbidities and participant's personal goals.       Expected Outcomes Short Term: Increase workloads from initial exercise prescription for resistance, speed, and METs.;Short Term: Perform resistance training exercises routinely during rehab and add in resistance training at home;Long Term: Improve cardiorespiratory fitness, muscular endurance and strength as measured by increased METs and functional capacity ( )       Able to understand and use rate of perceived exertion (RPE) scale Yes       Intervention Provide education and explanation on how to use RPE scale       Expected Outcomes Short Term: Able to use RPE daily in rehab to express subjective intensity level;Long Term:  Able to use RPE to guide intensity level when exercising independently       Able to understand and use Dyspnea scale Yes       Intervention Provide education and explanation on how to use Dyspnea scale       Expected Outcomes Short Term: Able to use Dyspnea scale daily in rehab to express subjective sense of shortness of breath during exertion;Long Term: Able to use Dyspnea scale to guide intensity level when exercising independently       Knowledge and understanding of Target Heart Rate Range (THRR) Yes       Intervention Provide education and explanation of THRR including how the numbers were predicted and where they are located for reference       Expected Outcomes Short Term: Able to state/look up THRR;Short Term: Able to use daily as guideline for intensity in rehab;Long Term: Able to use THRR to govern intensity when exercising independently       Able to check pulse independently  Yes       Intervention Provide education and demonstration on how to check pulse in carotid and radial arteries.;Review the importance of being  able to check your own pulse for safety during independent exercise       Expected Outcomes Short Term: Able to explain why pulse checking is important during independent exercise;Long Term: Able to check pulse independently and accurately       Understanding of Exercise Prescription Yes       Intervention Provide education, explanation, and written materials on patient's individual exercise prescription       Expected Outcomes Short Term: Able to explain program exercise prescription;Long Term: Able to explain home exercise prescription to exercise independently                Exercise Goals Re-Evaluation :  Exercise Goals Re-Evaluation     Row Name 04/25/22 0842 05/04/22 1433 05/18/22 1209 05/23/22 0749 06/01/22 0819     Exercise Goal Re-Evaluation   Exercise Goals Review Understanding of Exercise Prescription;Knowledge and understanding of Target Heart Rate Range (THRR);Able to understand and use Dyspnea scale;Able to understand and use rate of perceived exertion (RPE) scale Increase Physical Activity;Increase Strength and Stamina;Understanding of Exercise Prescription Increase Physical Activity;Increase Strength and Stamina;Understanding of Exercise Prescription Increase Physical Activity;Increase Strength and Stamina;Understanding of Exercise Prescription Increase Physical Activity;Increase Strength and Stamina;Understanding of Exercise Prescription;Able to understand and use rate of perceived exertion (RPE) scale;Able to understand and use Dyspnea scale;Able to check pulse independently;Knowledge and understanding of Target Heart Rate Range (THRR)   Comments Reviewed RPE scale, THR and program prescription with pt today.  Pt voiced understanding and was given a copy of goals to take home. Shelvy is off to a good start in the program. He had an  overall average MET level of 1.86 METs during his first two days of rehab. He also was able to tolerate 30 minutes on the T4 nustep at level 1. He tried to use the recumbent bike but had to switch back to the T4 nustep due to discomfort. We will continue to monitor his progress in the program. Al missed a couple sessions last review as he out town. He has been staying on the T4 Nustep as he is limited on fitting on machines. He also is limited by his breathing when walking. He tried out walking a couple laps on the track, we hope to that improve over time. His oxygen has been stayinh above 88%. We will continue to monitor. Al is doing well in rehab.  He has been using his bike/seated elliptical at home.  He will usually go for about 15-20 min.  We talked about continuing to build up his time to 30 min.  He is trying to get there.  He does feel like his stamina is starting to get better. Reviewed home exercise with pt today.  Pt plans to walk and use recumbent bike at home for exercise.  Reviewed THR, pulse, RPE, sign and symptoms, pulse oximetery and when to call 911 or MD.  Also discussed weather considerations and indoor options.  Pt voiced understanding.   Expected Outcomes Short: Use RPE daily to regulate intensity. Long: Follow program prescription in THR. Short: Continue to follow current exercise prescription. Long: Continue to improve strength and stamina. Short: Slowly add in more laps on track Long: Continue to increase overall MET level and stamina Short: Continue to add time in on bike at home Long: Conitnue to improve stamina Short: Start to add in exercise more at home Long: Continue to improve stamina    Row Name 06/01/22 1413 06/06/22 0757 06/13/22 1559 06/28/22 0737 07/06/22 0750  Exercise Goal Re-Evaluation   Exercise Goals Review Increase Physical Activity;Increase Strength and Stamina;Understanding of Exercise Prescription Increase Physical Activity;Increase Strength and  Stamina;Understanding of Exercise Prescription Increase Physical Activity;Increase Strength and Stamina;Understanding of Exercise Prescription Increase Physical Activity;Increase Strength and Stamina;Understanding of Exercise Prescription Increase Physical Activity;Increase Strength and Stamina;Understanding of Exercise Prescription   Comments Tryton is doing well in rehab. He has continued to try and walk the track but has been limited due to dizziness. He was able to improve to level 2 on the biostep and tolerated it for 30 minutes. He has continued to use 3 lb hand weights for resistance training as well. We will continue to monitor his progress in the program. Al states he has been using his recumbent bike for exercise. He uses it for about 15 minutes at a time, we talked about slowly accumulating his time up, and taking breaks when needed to. He practices PLB when he needs to and checks his O2, he states his O2 stays above 88%.  He continues to check his HR. He is using light therapy to help with neuropathy 2x/day for 90 days. He has seen an improvement so far and is hoping it may help improve his blaance and walking. Al continues to come to rehab, has missed a couple sessions due to transportation issues. He usually walks to and from rehab and stays on seated machines while at rehab. He has been working at level 2 on [employer] but continues to exercise at level 1 on the T4 Nustep. He would benefit from also increasing that to level 2. He is still limited by his SOB when exercising, with an average RPE of 15. We will continue to monitor. Al is doing well in rehab, although he has only attended rehab twic since the last review. He increased his overall average MET level to 2.18 METs. He also improved to level 3 on the T4 nustep and continued to work at level 2 on the biostep. His O2 saturations have stayed above 93% during exercise since the last review as well. We will continue to monitor his progress in  the program. Al is doing well in rehab. He is up to 15-34min on his recumbent bike at home.  His stamina is not getting too much better.  He feels he has a lot to get done, but not able to do it all.  He is also doing PT and neurorehab for nerves in his legs.   Expected Outcomes Short: Continue to push for more laps on the track. Long: Continue to improve strength and stamina. Short: Slowly build up tolerance on recumbent bike, monitor HR and O2 during Long: Continue independent exercise at home at appropriate prescription Short: Increase to level 2 on the Nustep Long: Continue to increase overall MET level and stamina Short: Continue to progressively increase workloads. Long: Continue to increase strength and stamina. Short: Continue to rehab nerves Long: Conitnue to exercise independent    Row Name 07/13/22 1312 07/26/22 0915 08/10/22 1436         Exercise Goal Re-Evaluation   Exercise Goals Review Increase Physical Activity;Increase Strength and Stamina;Understanding of Exercise Prescription Increase Physical Activity;Increase Strength and Stamina;Understanding of Exercise Prescription Increase Physical Activity;Increase Strength and Stamina;Understanding of Exercise Prescription     Comments Al is doing wellin rehab.  He does 30 min on NuStep or BioStep each day and is now on level 2 for the full time.  We will continue to monitor his progress. Al is doing  wellin rehab. He continues to do well with 30 min on the T4 nustep or biostep each day. He also improved back up to level 3 on the T4 nustep, while continuing to work at level 2 on the biostep. He also has stayed consistent with 3 lb hand weights for resistance training. We will continue to monitor his progress in the program. Al is doing well in rehab. He continues to work for 30 minutes on the biostep and T4 nustep at level 2. He also continues to do well with 3 lb hand weights for resistance training. We will continue to monitor his progress in the  program.     Expected Outcomes Short: Continue to increase workloads for longer periods of time Long: continue to improve stamina Short: Continue to progressively increase workloads for longer periods of time. Long: Continue to improve strength and stamina. Short: Try level 3 on the biostep, go back up to level 3 on the T4 nustep. Long: Continue to increase overall METs and stamina.              Discharge Exercise Prescription (Final Exercise Prescription Changes):  Exercise Prescription Changes - 08/10/22 1400       Response to Exercise   Blood Pressure (Admit) 118/78    Blood Pressure (Exit) 110/74    Heart Rate (Admit) 73 bpm    Heart Rate (Exercise) 89 bpm    Heart Rate (Exit) 73 bpm    Oxygen Saturation (Admit) 97 %    Oxygen Saturation (Exercise) 92 %    Oxygen Saturation (Exit) 97 %    Rating of Perceived Exertion (Exercise) 14    Perceived Dyspnea (Exercise) 2    Symptoms SOB    Duration Continue with 30 min of aerobic exercise without signs/symptoms of physical distress.    Intensity THRR unchanged      Progression   Progression Continue to progress workloads to maintain intensity without signs/symptoms of physical distress.    Average METs 2.25      Resistance Training   Training Prescription Yes    Weight 3 lb    Reps 10-15      Interval Training   Interval Training No      NuStep   Level 2    Minutes 30    METs 2.6      Biostep-RELP   Level 2    Minutes 30    METs 2      Home Exercise Plan   Plans to continue exercise at Home (comment)   walking,  recumbent bike   Frequency Add 3 additional days to program exercise sessions.    Initial Home Exercises Provided 06/01/22      Oxygen   Maintain Oxygen Saturation 88% or higher             Nutrition:  Target Goals: Understanding of nutrition guidelines, daily intake of sodium 1500mg , cholesterol 200mg , calories 30% from fat and 7% or less from saturated fats, daily to have 5 or more servings of  fruits and vegetables.  Education: All About Nutrition: -Group instruction provided by verbal, written material, interactive activities, discussions, models, and posters to present general guidelines for heart healthy nutrition including fat, fiber, MyPlate, the role of sodium in heart healthy nutrition, utilization of the nutrition label, and utilization of this knowledge for meal planning. Follow up email sent as well. Written material given at graduation. Flowsheet Row Pulmonary Rehab from 06/15/2022 in [facility]  Education need identified  04/12/22       Biometrics:  Pre Biometrics - 04/12/22 1256       Pre Biometrics   Height 5\' 10"  (1.778 m)    Weight 219 lb 11.2 oz (99.7 kg)    Waist Circumference 41 inches    Hip Circumference 40 inches    Waist to Hip Ratio 1.03 %    BMI (Calculated) 31.52    Single Leg Stand 1.4 seconds              Nutrition Therapy Plan and Nutrition Goals:  Nutrition Therapy & Goals - 06/06/22 0825       Nutrition Therapy   RD appointment deferred Yes             Nutrition Assessments:  MEDIFICTS Score Key: ?70 Need to make dietary changes  40-70 Heart Healthy Diet ? 40 Therapeutic Level Cholesterol Diet  Flowsheet Row Pulmonary Rehab from 04/12/2022 in [facility] Pulmonary Rehab  Picture Your Plate Total Score on Admission 44      Picture Your Plate Scores: <16 Unhealthy dietary pattern with much room for improvement. 41-50 Dietary pattern unlikely to meet recommendations for good health and room for improvement. 51-60 More healthful dietary pattern, with some room for improvement.  >60 Healthy dietary pattern, although there may be some specific behaviors that could be improved.   Nutrition Goals Re-Evaluation:  Nutrition Goals Re-Evaluation     Row Name 05/23/22 0751 06/06/22 0811 07/06/22 0759         Goals   Nutrition Goal Work on mechanical eating -- Short: Continue to work on  [employer] Long: Continue to eat pulmonary healthy based diet     Comment Al says that he is not hungry often but is eating some.  We talked about getting in at least two meals a day.  We talked about using meal replacement bars/shakes to help.  We talked about making sure he gets in enough calories each day. Patient has deferred talking to a RD at this time. He is working on trying to eat several smaller meals/ day and eating enough calories and macronutrients to meet his needs. Al is doing well in rehab.  He is eating small meals but feels that has become his new normal. He is trying to get in protein and varitey.     Expected Outcome Short: Try meal replacement options Long: conitnue to improve diet Short: Continue to work on [employer] Long: Continue to eat pulmonary healthy based diet Continue to work on eating around the clock.              Nutrition Goals Discharge (Final Nutrition Goals Re-Evaluation):  Nutrition Goals Re-Evaluation - 07/06/22 0759       Goals   Nutrition Goal Short: Continue to work on smaller/frequent meals Long: Continue to eat pulmonary healthy based diet    Comment Al is doing well in rehab.  He is eating small meals but feels that has become his new normal. He is trying to get in protein and varitey.    Expected Outcome Continue to work on eating around the clock.             Psychosocial: Target Goals: Acknowledge presence or absence of significant depression and/or stress, maximize coping skills, provide positive support system. Participant is able to verbalize types and ability to use techniques and skills needed for reducing stress and depression.   Education: Stress, Anxiety, and Depression - Group verbal and visual presentation  to define topics covered.  Reviews how body is impacted by stress, anxiety, and depression.  Also discusses healthy ways to reduce stress and to treat/manage anxiety and depression.  Written material  given at graduation. Flowsheet Row Pulmonary Rehab from 06/15/2022 in [facility] Cardiac and Pulmonary Rehab  Date 04/27/22  Educator [facility]  Instruction Review Code 1- [employer] Understanding       Education: Sleep Hygiene -Provides group verbal and written instruction about how sleep can affect your health.  Define sleep hygiene, discuss sleep cycles and impact of sleep habits. Review good sleep hygiene tips.    Initial Review & Psychosocial Screening:  Initial Psych Review & Screening - 04/10/22 0956       Initial Review   Current issues with Current Anxiety/Panic;History of Depression;Current Psychotropic Meds;Current Sleep Concerns      Family Dynamics   Good Support System? Yes    Comments Wayden states that his health and pain levels are the source of his depression and anxiety. He states he feels bad all the time. Al can look to his wife, son and other family for support.      Barriers   Psychosocial barriers to participate in program The patient should benefit from training in stress management and relaxation.      Screening Interventions   Interventions Encouraged to exercise;To provide support and resources with identified psychosocial needs;Provide feedback about the scores to participant    Expected Outcomes Short Term goal: Utilizing psychosocial counselor, staff and physician to assist with identification of specific Stressors or current issues interfering with healing process. Setting desired goal for each stressor or current issue identified.;Long Term Goal: Stressors or current issues are controlled or eliminated.;Short Term goal: Identification and review with participant of any Quality of Life or Depression concerns found by scoring the questionnaire.;Long Term goal: The participant improves quality of Life and PHQ9 Scores as seen by post scores and/or verbalization of changes             Quality of Life Scores:  Scores of 19 and below usually indicate a poorer  quality of life in these areas.  A difference of  2-3 points is a clinically meaningful difference.  A difference of 2-3 points in the total score of the Quality of Life Index has been associated with significant improvement in overall quality of life, self-image, physical symptoms, and general health in studies assessing change in quality of life.  PHQ-9: Review Flowsheet       05/11/2022 04/12/2022  Depression screen PHQ 2/9  Decreased Interest 1 2  Down, Depressed, Hopeless 1 1  PHQ - 2 Score 2 3  Altered sleeping 3 2  Tired, decreased energy 3 2  Change in appetite 3 2  Feeling bad or failure about yourself  1 0  Trouble concentrating 0 0  Moving slowly or fidgety/restless 0 1  Suicidal thoughts 0 0  PHQ-9 Score 12 10  Difficult doing work/chores Somewhat difficult Somewhat difficult   Interpretation of Total Score  Total Score Depression Severity:  1-4 = Minimal depression, 5-9 = Mild depression, 10-14 = Moderate depression, 15-19 = Moderately severe depression, 20-27 = Severe depression   Psychosocial Evaluation and Intervention:  Psychosocial Evaluation - 04/10/22 0958       Psychosocial Evaluation & Interventions   Interventions Encouraged to exercise with the program and follow exercise prescription;Relaxation education;Stress management education    Comments Elkin states that his health and pain levels are the source of his depression and anxiety.  He states he feels bad all the time. Al can look to his wife, son and other family for support.    Expected Outcomes Short: Start [facility] to help with mood. Long: Maintain a healthy mental state.    Continue Psychosocial Services  Follow up required by staff             Psychosocial Re-Evaluation:  Psychosocial Re-Evaluation     Row Name 05/11/22 0809 06/06/22 0813 07/06/22 1610         Psychosocial Re-Evaluation   Current issues with Current Sleep Concerns Current Stress Concerns Current Stress Concerns      Comments Reviewed patient health questionnaire (PHQ-9) with patient for follow up. Previously, patients score indicated signs/symptoms of depression.  Reviewed to see if patient is improving symptom wise while in program.  Score declined and patient states that it is because he hasnt been sleeping well and has little energy. Al just completed a new sleep study this last Sunday to get a new CPAP, getting fit for a new mask. He is still waiting on the results. Wakes up from chronic pain at night which he following pain management. His biggest stressor is that his roof is leaking and may need a new one costing him a financial burden. Coming to rehab helps him get his mind off of things and hopes to continue to see improvement. Al is doing well in rehab.  He is still getting frustruted with not being able to get every thing done.  He is going to pulm rehab and neuro rehab as well.  He is still taking his supplements. He is still waiting on his sleep study to be reviewed with him to get new mask. He is still in process of getting new roof put on and he is still paying for it as well.  He is getting in his exercise and hoping to feel better once it is all done.     Expected Outcomes Short: Continue to work toward an improvement in PHQ9 scores by attending [facility] regularly. Long: Continue to improve stress and depression coping skills by talking with staff and attending [facility] regularly and work toward a positive mental state. Short: Get results from sleep study Long: Continue to utilize exercise for stress management and positive attitude Short: Continue to try to hear back about sleep study Long: Conitnue to improve stamina and exercise for mental boost     Interventions Encouraged to attend Pulmonary Rehabilitation for the exercise Encouraged to attend Pulmonary Rehabilitation for the exercise Encouraged to attend Pulmonary Rehabilitation for the exercise     Continue Psychosocial Services  Follow up required  by staff Follow up required by staff Follow up required by staff              Psychosocial Discharge (Final Psychosocial Re-Evaluation):  Psychosocial Re-Evaluation - 07/06/22 9604       Psychosocial Re-Evaluation   Current issues with Current Stress Concerns    Comments Al is doing well in rehab.  He is still getting frustruted with not being able to get every thing done.  He is going to pulm rehab and neuro rehab as well.  He is still taking his supplements. He is still waiting on his sleep study to be reviewed with him to get new mask. He is still in process of getting new roof put on and he is still paying for it as well.  He is getting in his exercise and hoping to feel better once it is  all done.    Expected Outcomes Short: Continue to try to hear back about sleep study Long: Conitnue to improve stamina and exercise for mental boost    Interventions Encouraged to attend Pulmonary Rehabilitation for the exercise    Continue Psychosocial Services  Follow up required by staff             Education: Education Goals: Education classes will be provided on a weekly basis, covering required topics. Participant will state understanding/return demonstration of topics presented.  Learning Barriers/Preferences:  Learning Barriers/Preferences - 04/12/22 1257       Learning Barriers/Preferences   Learning Barriers Hearing    Learning Preferences None             General Pulmonary Education Topics:  Infection Prevention: - Provides verbal and written material to individual with discussion of infection control including proper hand washing and proper equipment cleaning during exercise session. Flowsheet Row Pulmonary Rehab from 06/15/2022 in [facility] Cardiac and Pulmonary Rehab  Date 04/10/22  Educator [facility]  Instruction Review Code 1- Verbalizes Understanding       Falls Prevention: - Provides verbal and written material to individual with discussion of falls prevention and  safety. Flowsheet Row Pulmonary Rehab from 06/15/2022 in [facility] Cardiac and Pulmonary Rehab  Date 04/10/22  Educator [facility]  Instruction Review Code 1- Verbalizes Understanding       Chronic Lung Disease Review: - Group verbal instruction with posters, models, PowerPoint presentations and videos,  to review new updates, new respiratory medications, new advancements in procedures and treatments. Providing information on websites and "800" numbers for continued self-education. Includes information about supplement oxygen, available portable oxygen systems, continuous and intermittent flow rates, oxygen safety, concentrators, and Medicare reimbursement for oxygen. Explanation of Pulmonary Drugs, including class, frequency, complications, importance of spacers, rinsing mouth after steroid MDI's, and proper cleaning methods for nebulizers. Review of basic lung anatomy and physiology related to function, structure, and complications of lung disease. Review of risk factors. Discussion about methods for diagnosing sleep apnea and types of masks and machines for OSA. Includes a review of the use of types of environmental controls: home humidity, furnaces, filters, dust mite/pet prevention, HEPA vacuums. Discussion about weather changes, air quality and the benefits of nasal washing. Instruction on Warning signs, infection symptoms, calling MD promptly, preventive modes, and value of vaccinations. Review of effective airway clearance, coughing and/or vibration techniques. Emphasizing that all should Create an Action Plan. Written material given at graduation. Flowsheet Row Pulmonary Rehab from 06/15/2022 in [facility] Cardiac and Pulmonary Rehab  Education need identified 04/12/22       AED/CPR: - Group verbal and written instruction with the use of models to demonstrate the basic use of the AED with the basic ABC's of resuscitation.    Anatomy and Cardiac Procedures: - Group verbal and visual presentation and models  provide information about basic cardiac anatomy and function. Reviews the testing methods done to diagnose heart disease and the outcomes of the test results. Describes the treatment choices: Medical Management, Angioplasty, or Coronary Bypass Surgery for treating various heart conditions including Myocardial Infarction, Angina, Valve Disease, and Cardiac Arrhythmias.  Written material given at graduation.   Medication Safety: - Group verbal and visual instruction to review commonly prescribed medications for heart and lung disease. Reviews the medication, class of the drug, and side effects. Includes the steps to properly store meds and maintain the prescription regimen.  Written material given at graduation. Flowsheet Row Pulmonary Rehab from 06/15/2022 in [facility]  Cardiac and Pulmonary Rehab  Date 06/15/22  Educator SB  Instruction Review Code 1- Verbalizes Understanding       Other: -Provides group and verbal instruction on various topics (see comments)   Knowledge Questionnaire Score:  Knowledge Questionnaire Score - 04/12/22 1257       Knowledge Questionnaire Score   Pre Score 11/18              Core Components/Risk Factors/Patient Goals at Admission:  Personal Goals and Risk Factors at Admission - 04/12/22 1257       Core Components/Risk Factors/Patient Goals on Admission    Weight Management Yes;Weight Loss;Obesity    Intervention Weight Management: Develop a combined nutrition and exercise program designed to reach desired caloric intake, while maintaining appropriate intake of nutrient and fiber, sodium and fats, and appropriate energy expenditure required for the weight goal.;Weight Management: Provide education and appropriate resources to help participant work on and attain dietary goals.;Weight Management/Obesity: Establish reasonable short term and long term weight goals.;Obesity: Provide education and appropriate resources to help participant work on and attain dietary  goals.    Admit Weight 219 lb 11.2 oz (99.7 kg)    Goal Weight: Short Term 214 lb (97.1 kg)    Goal Weight: Long Term 200 lb (90.7 kg)    Expected Outcomes Short Term: Continue to assess and modify interventions until short term weight is achieved;Long Term: Adherence to nutrition and physical activity/exercise program aimed toward attainment of established weight goal;Weight Loss: Understanding of general recommendations for a balanced deficit meal plan, which promotes 1-2 lb weight loss per week and includes a negative energy balance of [phone] kcal/d;Understanding recommendations for meals to include 15-35% energy as protein, 25-35% energy from fat, 35-60% energy from carbohydrates, less than 200mg  of dietary cholesterol, 20-35 gm of total fiber daily;Understanding of distribution of calorie intake throughout the day with the consumption of 4-5 meals/snacks    Improve shortness of breath with ADL's Yes    Intervention Provide education, individualized exercise plan and daily activity instruction to help decrease symptoms of SOB with activities of daily living.    Expected Outcomes Short Term: Improve cardiorespiratory fitness to achieve a reduction of symptoms when performing ADLs;Long Term: Be able to perform more ADLs without symptoms or delay the onset of symptoms    Increase knowledge of respiratory medications and ability to use respiratory devices properly  Yes    Intervention Provide education and demonstration as needed of appropriate use of medications, inhalers, and oxygen therapy.    Expected Outcomes Short Term: Achieves understanding of medications use. Understands that oxygen is a medication prescribed by physician. Demonstrates appropriate use of inhaler and oxygen therapy.;Long Term: Maintain appropriate use of medications, inhalers, and oxygen therapy.    Diabetes Yes    Intervention Provide education about signs/symptoms and action to take for hypo/hyperglycemia.;Provide education  about proper nutrition, including hydration, and aerobic/resistive exercise prescription along with prescribed medications to achieve blood glucose in normal ranges: Fasting glucose 65-99 mg/dL    Expected Outcomes Short Term: Participant verbalizes understanding of the signs/symptoms and immediate care of hyper/hypoglycemia, proper foot care and importance of medication, aerobic/resistive exercise and nutrition plan for blood glucose control.;Long Term: Attainment of HbA1C < 7%.    Hypertension Yes    Intervention Provide education on lifestyle modifcations including regular physical activity/exercise, weight management, moderate sodium restriction and increased consumption of fresh fruit, vegetables, and low fat dairy, alcohol moderation, and smoking cessation.;Monitor prescription use compliance.    Expected  Outcomes Short Term: Continued assessment and intervention until BP is < 140/57mm HG in hypertensive participants. < 130/58mm HG in hypertensive participants with diabetes, heart failure or chronic kidney disease.;Long Term: Maintenance of blood pressure at goal levels.    Intervention Provide education and support for participant on nutrition & aerobic/resistive exercise along with prescribed medications to achieve LDL 70mg , HDL >40mg .    Expected Outcomes Short Term: Participant states understanding of desired cholesterol values and is compliant with medications prescribed. Participant is following exercise prescription and nutrition guidelines.;Long Term: Cholesterol controlled with medications as prescribed, with individualized exercise RX and with personalized nutrition plan. Value goals: LDL < 70mg , HDL > 40 mg.             Education:Diabetes - Individual verbal and written instruction to review signs/symptoms of diabetes, desired ranges of glucose level fasting, after meals and with exercise. Acknowledge that pre and post exercise glucose checks will be done for 3 sessions at entry of  program. Flowsheet Row Pulmonary Rehab from 06/15/2022 in [facility] Cardiac and Pulmonary Rehab  Date 04/10/22  Educator [facility]  Instruction Review Code 1- Verbalizes Understanding       Know Your Numbers and Heart Failure: - Group verbal and visual instruction to discuss disease risk factors for cardiac and pulmonary disease and treatment options.  Reviews associated critical values for Overweight/Obesity, Hypertension, Cholesterol, and Diabetes.  Discusses basics of heart failure: signs/symptoms and treatments.  Introduces Heart Failure Zone chart for action plan for heart failure.  Written material given at graduation.   Core Components/Risk Factors/Patient Goals Review:   Goals and Risk Factor Review     Row Name 05/23/22 0753 06/06/22 0805 07/06/22 0801         Core Components/Risk Factors/Patient Goals Review   Personal Goals Review Weight Management/Obesity;Improve shortness of breath with ADL's;Increase knowledge of respiratory medications and ability to use respiratory devices properly.;Diabetes;Hypertension Weight Management/Obesity;Improve shortness of breath with ADL's;Increase knowledge of respiratory medications and ability to use respiratory devices properly.;Diabetes;Hypertension Weight Management/Obesity;Improve shortness of breath with ADL's;Increase knowledge of respiratory medications and ability to use respiratory devices properly.;Diabetes;Hypertension     Review Al is doing well in rehab.  His weight is trending down and is down to 211 here and 205 at home.  He is pleased with his progress.  His blood pressures are doing well haning around 130s/80s.  His sugars have been around 130s as well.  He checks both daily at home.  His breathing is rough today.  Overall, it has been getting better. He is using his inhaler and nebulizer each day. Al states he feels like he's doing a lot at home at the moment as he is participating in light therapy for his neuropathy in addition to his home  exercise. He has good and bad breathing days and continues to monitor his O2 and using his oxygen prn. He is taking all medications OK. Continues to use his inhaler and nebulizer. He is now able to walk down here using his walker. BP has been running nicely at home and rehab, ranging around 120/70s. He checks it daily. So far he has maintained his weight. Overall he has lost 20 lbs in 3 months from Ozempic and wants to be under 200 lb. Today he was around 210 lb. He checks his  blood sugar every morning which is ranging around 130 every day, where his MD wants it to be. Al is doing well in rehab.  His weight goes up and down, but  he thinks that it is trending down overall.  He is down 20 lb since all of this started.  His pressures are doing well and his sugars continue to do well.  He is good about using his inhalers.  His breathing is still hanging around about the same, but his is also limited his pain too. He is doing well with using his PLB.     Expected Outcomes Short: Continue to use breathing treatments on bad breathing days Long: Conitnue to work on weight loss Short: Continue to work on weight loss and monitor BP/BS Long: Work toward weight goal under 200 lb Short: Continue to work on [employer] loss Long: Continue to monitor risk factors.              Core Components/Risk Factors/Patient Goals at Discharge (Final Review):   Goals and Risk Factor Review - 07/06/22 0801       Core Components/Risk Factors/Patient Goals Review   Personal Goals Review Weight Management/Obesity;Improve shortness of breath with ADL's;Increase knowledge of respiratory medications and ability to use respiratory devices properly.;Diabetes;Hypertension    Review Al is doing well in rehab.  His weight goes up and down, but he thinks that it is trending down overall.  He is down 20 lb since all of this started.  His pressures are doing well and his sugars continue to do well.  He is good about using his inhalers.  His  breathing is still hanging around about the same, but his is also limited his pain too. He is doing well with using his PLB.    Expected Outcomes Short: Continue to work on weight loss Long: Continue to monitor risk factors.             ITP Comments:  ITP Comments     Row Name 04/10/22 4098 04/12/22 1141 04/25/22 0840 05/03/22 0927 05/31/22 1349   ITP Comments Virtual Visit completed. Patient informed on EP and RD appointment and 6 Minute walk test. Patient also informed of patient health questionnaires on My Chart. Patient Verbalizes understanding. Visit diagnosis can be found in [facility] 02/01/2022. Completed and gym orientation. Initial ITP created and sent for review to Dr. Jinny Sanders, Medical Director. First full day of exercise!  Patient was oriented to gym and equipment including functions, settings, policies, and procedures.  Patient's individual exercise prescription and treatment plan were reviewed.  All starting workloads were established based on the results of the 6 minute walk test done at initial orientation visit.  The plan for exercise progression was also introduced and progression will be customized based on patient's performance and goals. 30 Day review completed. Medical Director ITP review done, changes made as directed, and signed approval by Medical Director.     new to program 30 day review completed. ITP sent to Dr. Jinny Sanders, Medical Director of  Pulmonary Rehab. Continue with ITP unless changes are made by physician.    Row Name 06/28/22 0934 07/26/22 1124 08/22/22 1458       ITP Comments 30 Day review completed. Medical Director ITP review done, changes made as directed, and signed approval by Medical Director. 30 Day review completed. Medical Director ITP review done, changes made as directed, and signed approval by Medical Director. 30 Day review completed. Medical Director ITP review done, changes made as directed, and signed approval by Medical Director.               Comments:

## 2022-08-24 ENCOUNTER — Encounter: Payer: Medicare Other | Admitting: *Deleted

## 2022-08-29 ENCOUNTER — Encounter: Payer: Medicare Other | Admitting: *Deleted

## 2022-08-29 DIAGNOSIS — D869 Sarcoidosis, unspecified: Secondary | ICD-10-CM

## 2022-08-29 NOTE — Progress Notes (Signed)
Incomplete Session Note  Patient Details  Name: MOUA RASMUSSON MRN: [id number] Date of Birth: Apr 08, 1950 Referring Provider:   Flowsheet Row Pulmonary Rehab from 04/12/2022 in [facility] Cardiac and Pulmonary Rehab  Referring Provider Landry Dyke MD       Elisabeth Pigeon did not complete his rehab session.  Al's initial BP was low 76/52. He was advised to drink water. Over 30 min and a bottle of water his BP increased to 88/62. He stated he has been feeling dizzy today. Skin warm and dry, no other symptoms. He was sent home to call physician and let them know what is happening. Al was advised to call 911 if his symptoms got worse.  I spoke to Sharon,his wife, she and Al both verbalized understanding of the advice  provided.  Jasmine December picked up Al to take him home. He was advised to continue drinking water and to monitor his BP.

## 2022-08-31 ENCOUNTER — Encounter: Payer: Medicare Other | Admitting: *Deleted

## 2022-09-07 ENCOUNTER — Encounter: Payer: Medicare Other | Admitting: *Deleted

## 2022-09-12 ENCOUNTER — Encounter: Payer: Medicare Other | Admitting: *Deleted

## 2022-09-12 DIAGNOSIS — D869 Sarcoidosis, unspecified: Secondary | ICD-10-CM

## 2022-09-12 DIAGNOSIS — D860 Sarcoidosis of lung: Secondary | ICD-10-CM | POA: Diagnosis not present

## 2022-09-12 NOTE — Progress Notes (Signed)
Daily Session Note  Patient Details  Name: Ethan Frye MRN: [id number] Date of Birth: 1950-05-13 Referring Provider:   Flowsheet Row Pulmonary Rehab from 04/12/2022 in [facility] Cardiac and Pulmonary Rehab  Referring Provider Landry Dyke MD       Encounter Date: 09/12/2022  Check In:  Session Check In - 09/12/22 0835       Check-In   Supervising physician immediately available to respond to emergencies See telemetry face sheet for immediately available ER MD    Location [facility]-Cardiac & Pulmonary Rehab    Staff Present Cora Collum, RN, BSN, CCRP;Other   Maxon Conetta BS Exercise Physiologist, Rory Percy MS Exercise Physiologist   Virtual Visit No    Medication changes reported     No    Fall or balance concerns reported    No    Warm-up and Cool-down Performed on first and last piece of equipment    Resistance Training Performed Yes    VAD Patient? No    PAD/SET Patient? No      Pain Assessment   Currently in Pain? No/denies                Social History   Tobacco Use  Smoking Status Former   Current packs/day: 0.00   Average packs/day: 0.3 packs/day for 8.0 years (2.0 ttl pk-yrs)   Types: Cigarettes   Start date: 03/16/1969   Quit date: 03/16/1977   Years since quitting: 45.5   Passive exposure: Past  Smokeless Tobacco Former    Goals Met:  Proper associated with RPD/PD & O2 Sat [place] with exercise equipment Exercise tolerated well No report of concerns or symptoms today  Goals Unmet:  Not Applicable  Comments: Pt able to follow exercise prescription today without complaint.  Will continue to monitor for progression.    Dr. Bethann Punches is Medical Director for [facility] Cardiac Rehabilitation.  Dr. Vida Rigger is Medical Director for [facility] Pulmonary Rehabilitation.

## 2022-09-14 ENCOUNTER — Encounter: Payer: Medicare Other | Admitting: *Deleted

## 2022-09-20 ENCOUNTER — Encounter: Payer: Self-pay | Admitting: *Deleted

## 2022-09-20 DIAGNOSIS — D869 Sarcoidosis, unspecified: Secondary | ICD-10-CM

## 2022-09-20 NOTE — Progress Notes (Signed)
Pulmonary Individual Treatment Plan  Patient Details  Name: AADHI SMYSER MRN: [id number] Date of Birth: 05/01/1950 Referring Provider:   Flowsheet Row Pulmonary Rehab from 04/12/2022 in [facility] Cardiac and Pulmonary Rehab  Referring Provider Landry Dyke MD       Initial Encounter Date:  Flowsheet Row Pulmonary Rehab from 04/12/2022 in [facility] Cardiac and Pulmonary Rehab  Date 04/12/22       Visit Diagnosis: Sarcoidosis  Patient's Home Medications on Admission:  Current Outpatient Medications:    acidophilus (RISAQUAD) CAPS capsule, Take 1 capsule by mouth daily., Disp: , Rfl:    albuterol (PROVENTIL) (2.5 MG/3ML) 0.083% nebulizer solution, Take 2.5 mg by nebulization every 6 (six) hours as needed for wheezing or shortness of breath. (Patient not taking: Reported on 04/10/2022), Disp: , Rfl:    albuterol (VENTOLIN HFA) 108 (90 Base) MCG/ACT inhaler, Inhale 2 puffs into the lungs every 6 (six) hours as needed for wheezing or shortness of breath., Disp: , Rfl:    aspirin EC 81 MG tablet, Take 81 mg by mouth daily. Swallow whole., Disp: , Rfl:    aspirin EC 81 MG tablet, Take by mouth. (Patient not taking: Reported on 04/10/2022), Disp: , Rfl:    atorvastatin (LIPITOR) 20 MG tablet, Take 20 mg by mouth daily. (Patient not taking: Reported on 04/10/2022), Disp: , Rfl:    atorvastatin (LIPITOR) 40 MG tablet, Take by mouth., Disp: , Rfl:    azithromycin (ZITHROMAX) 500 MG tablet, Take 500 mg by mouth every Monday, Wednesday, and Friday. (Patient not taking: Reported on 04/10/2022), Disp: , Rfl:    Blood Glucose Monitoring Suppl (FIFTY50 GLUCOSE METER 2.0) w/Device KIT, See admin instructions., Disp: , Rfl:    budesonide (PULMICORT) 0.5 MG/2ML nebulizer solution, Inhale into the lungs., Disp: , Rfl:    carvedilol (COREG) 25 MG tablet, Take 25 mg by mouth 2 (two) times daily. (Patient not taking: Reported on 04/10/2022), Disp: , Rfl:    carvedilol (COREG) 25 MG tablet, Take 1 tablet by mouth 2  (two) times daily with a meal., Disp: , Rfl:    celecoxib (CELEBREX) 200 MG capsule, Take 200 mg by mouth daily as needed for pain. (Patient not taking: Reported on 04/10/2022), Disp: , Rfl:    celecoxib (CELEBREX) 200 MG capsule, Take by mouth., Disp: , Rfl:    cetirizine (ZYRTEC) 10 MG tablet, Take 10 mg by mouth daily., Disp: , Rfl:    cholecalciferol (VITAMIN D) 25 MCG (1000 UNIT) tablet, Take 1,000 Units by mouth daily., Disp: , Rfl:    clonazePAM (KLONOPIN) 1 MG tablet, Take 1 mg by mouth 2 (two) times daily. (Patient not taking: Reported on 04/10/2022), Disp: , Rfl:    clopidogrel (PLAVIX) 75 MG tablet, clopidogrel 75 mg tablet  TAKE 1 TABLET (75 MG TOTAL) BY MOUTH ONCE DAILY., Disp: , Rfl:    Cysteamine Bitartrate (PROCYSBI) 300 MG PACK, Use 1 each 3 (three) times daily Use as instructed., Disp: , Rfl:    diclofenac Sodium (VOLTAREN) 1 % GEL, Apply 2 g topically 4 (four) times daily. (Patient not taking: Reported on 04/10/2022), Disp: , Rfl:    diclofenac Sodium (VOLTAREN) 1 % GEL, Apply 2 g topically 4 (four) times daily., Disp: , Rfl:    doxepin (SINEQUAN) 50 MG capsule, Take 150 mg by mouth at bedtime. (Patient not taking: Reported on 04/10/2022), Disp: , Rfl:    doxepin (SINEQUAN) 50 MG capsule, Take by mouth., Disp: , Rfl:    DULoxetine (CYMBALTA) 60 MG  capsule, Take 60 mg by mouth daily. (Patient not taking: Reported on 04/10/2022), Disp: , Rfl:    DULoxetine (CYMBALTA) 60 MG capsule, Take 1 tablet by mouth daily., Disp: , Rfl:    ferrous sulfate 325 (65 FE) MG tablet, Take 325 mg by mouth daily with breakfast., Disp: , Rfl:    fluticasone-salmeterol (ADVAIR) 500-50 MCG/ACT AEPB, Inhale 1 puff into the lungs every 12 (twelve) hours., Disp: , Rfl:    glucose blood (PRECISION QID TEST) test strip, Use 1 each (1 strip total) once daily Use as instructed., Disp: , Rfl:    HYDROcodone-acetaminophen (NORCO/VICODIN) 5-325 MG tablet, Take 1 tablet by mouth every 4 (four) hours as needed for pain.,  Disp: , Rfl:    ketoconazole (NIZORAL) 2 % cream, ketoconazole 2 % topical cream  APPLY TWICE DAILY TO RASH ON FACE AND GROIN, Disp: , Rfl:    lisinopril (ZESTRIL) 40 MG tablet, Take 40 mg by mouth daily., Disp: , Rfl:    losartan-hydrochlorothiazide (HYZAAR) 100-25 MG tablet, Take 1 tablet by mouth daily., Disp: , Rfl:    metFORMIN (GLUCOPHAGE) 500 MG tablet, Take 500-1,000 mg by mouth 2 (two) times daily with a meal. (Patient not taking: Reported on 04/10/2022), Disp: , Rfl:    metFORMIN (GLUCOPHAGE) 500 MG tablet, Take by mouth., Disp: , Rfl:    methadone (DOLOPHINE) 5 MG tablet, Take 2.5-5 mg by mouth 2 (two) times daily. Take 2.5 mg (one-half tablet) twice daily for one week then increase to 5 mg (one tablet) twice daily, thereafter, Disp: , Rfl:    montelukast (SINGULAIR) 10 MG tablet, Take 10 mg by mouth daily. (Patient not taking: Reported on 04/10/2022), Disp: , Rfl:    montelukast (SINGULAIR) 10 MG tablet, Take 1 tablet by mouth daily., Disp: , Rfl:    moxifloxacin (VIGAMOX) 0.5 % ophthalmic solution, moxifloxacin 0.5 % eye drops  PUT 1 DROP IN RIGHT EYE 4 TIMES A DAY FOR 7 DAYS OR AS DIRECTED, Disp: , Rfl:    Multiple Vitamins-Minerals (MULTIVITAMIN WITH MINERALS) tablet, Take 1 tablet by mouth daily., Disp: , Rfl:    naloxone (NARCAN) nasal spray 4 mg/0.1 mL, Place into the nose., Disp: , Rfl:    nitroGLYCERIN (NITROSTAT) 0.4 MG SL tablet, nitroglycerin 0.4 mg sublingual tablet  PLACE 1 TABLET UNDER THE TONGUE EVERY FOR CHEST PAIN, MAY TAKE UP TO 3 DOSES, Disp: , Rfl:    nystatin (MYCOSTATIN) 100000 UNIT/ML suspension, Take 2 mLs by mouth in the morning, at noon, in the evening, and at bedtime. (Patient not taking: Reported on 04/10/2022), Disp: , Rfl:    nystatin (MYCOSTATIN) 100000 UNIT/ML suspension, nystatin 100,000 unit/mL oral suspension  SWISH AND SWALLOW 5 MLS 4 (FOUR) TIMES DAILY., Disp: , Rfl:    omeprazole (PRILOSEC) 40 MG capsule, Take 40 mg by mouth daily. (Patient not  taking: Reported on 04/10/2022), Disp: , Rfl:    omeprazole (PRILOSEC) 40 MG capsule, Take 1 tablet by mouth daily., Disp: , Rfl:    oxyCODONE (OXY IR/ROXICODONE) 5 MG immediate release tablet, Take by mouth., Disp: , Rfl:    pimecrolimus (ELIDEL) 1 % cream, Apply 1 application topically 2 (two) times daily., Disp: , Rfl:    pimecrolimus (ELIDEL) 1 % cream, Elidel 1 % topical cream  APPLY TWICE DAILY TO AFFECTED AREA(S) FOR 1-2 WEEKS (Patient not taking: Reported on 04/10/2022), Disp: , Rfl:    polyethylene glycol (MIRALAX / GLYCOLAX) 17 g packet, Take 17 g by mouth 2 (two) times daily. (Patient  not taking: Reported on 04/10/2022), Disp: , Rfl:    polyethylene glycol powder (GLYCOLAX/MIRALAX) 17 GM/SCOOP powder, Take by mouth., Disp: , Rfl:    pregabalin (LYRICA) 75 MG capsule, Take 75 mg by mouth 3 (three) times daily., Disp: , Rfl:    Semaglutide,0.25 or 0.5MG /DOS, (OZEMPIC, 0.25 OR 0.5 MG/DOSE,) 2 MG/3ML SOPN, Inject into the skin., Disp: , Rfl:    senna-docusate (SENOKOT-S) 8.6-50 MG tablet, Take 1 tablet by mouth daily. (Patient not taking: Reported on 04/10/2022), Disp: , Rfl:    senna-docusate (SENOKOT-S) 8.6-50 MG tablet, Take 2 tablets by mouth daily., Disp: , Rfl:    spironolactone (ALDACTONE) 25 MG tablet, Take 12.5 mg by mouth daily., Disp: , Rfl:    SUMAtriptan (IMITREX) 50 MG tablet, Take 50 mg by mouth as directed., Disp: , Rfl:    tiZANidine (ZANAFLEX) 4 MG tablet, Take 4 mg by mouth 3 (three) times daily., Disp: , Rfl:    traMADol (ULTRAM) 50 MG tablet, Take 50 mg by mouth every 6 (six) hours as needed for pain., Disp: , Rfl:    vitamin B-12 (CYANOCOBALAMIN) 1000 MCG tablet, Take 1,000 mcg by mouth daily., Disp: , Rfl:   Past Medical History: No past medical history on file.  Tobacco Use: Social History   Tobacco Use  Smoking Status Former   Current packs/day: 0.00   Average packs/day: 0.3 packs/day for 8.0 years (2.0 ttl pk-yrs)   Types: Cigarettes   Start date: 03/16/1969    Quit date: 03/16/1977   Years since quitting: 45.5   Passive exposure: Past  Smokeless Tobacco Former    Labs: Investment banker, operational       Latest Ref Rng & Units 11/04/2020 11/05/2020  Labs for ITP Cardiac and Pulmonary Rehab  Hemoglobin A1c 4.8 - 5.6 % - 6.2   PH, Arterial 7.350 - 7.450 7.24  7.34   PCO2 arterial 32.0 - 48.0 mmHg 44  37   Bicarbonate 20.0 - 28.0 mmol/L 20.2  18.9  20.0   Acid-base deficit 0.0 - 2.0 mmol/L 6.9  8.2  5.2   O2 Saturation % 75.8  89.5  99.2     Details       Multiple values from one day are sorted in reverse-chronological order          Pulmonary Assessment Scores:  Pulmonary Assessment Scores     Row Name 04/12/22 1259         ADL UCSD   ADL Phase Entry     SOB Score total 91     Rest 3     Walk 4     Stairs 5     Bath 2     Dress 3     Shop 4       CAT Score   CAT Score 32       mMRC Score   mMRC Score 4              UCSD: Self-administered rating of dyspnea associated with activities of daily living (ADLs) 6-point scale (0 = "not at all" to 5 = "maximal or unable to do because of breathlessness")  Scoring Scores range from 0 to 120.  Minimally important difference is 5 units  CAT: CAT can identify the health impairment of COPD patients and is better correlated with disease progression.  CAT has a scoring range of zero to 40. The CAT score is classified into four groups of low (less than 10), medium (10 - 20), high (21-30)  and very high (31-40) based on the impact level of disease on health status. A CAT score over 10 suggests significant symptoms.  A worsening CAT score could be explained by an exacerbation, poor medication adherence, poor inhaler technique, or progression of COPD or comorbid conditions.  CAT MCID is 2 points  mMRC: mMRC (Modified Medical Research Council) Dyspnea Scale is used to assess the degree of baseline functional disability in patients of respiratory disease due to dyspnea. No minimal important  difference is established. A decrease in score of 1 point or greater is considered a positive change.   Pulmonary Function Assessment:  Pulmonary Function Assessment - 04/12/22 1259       Breath   Shortness of Breath Yes;Limiting activity;Panic with Shortness of Breath             Exercise Target Goals: Exercise Program Goal: Individual exercise prescription set using results from initial 6 min walk test and THRR while considering  patient's activity barriers and safety.   Exercise Prescription Goal: Initial exercise prescription builds to 30-45 minutes a day of aerobic activity, 2-3 days per week.  Home exercise guidelines will be given to patient during program as part of exercise prescription that the participant will acknowledge.  Education: Aerobic Exercise: - Group verbal and visual presentation on the components of exercise prescription. Introduces F.I.T.T principle from ACSM for exercise prescriptions.  Reviews F.I.T.T. principles of aerobic exercise including progression. Written material given at graduation. Flowsheet Row Pulmonary Rehab from 06/15/2022 in [facility] Cardiac and Pulmonary Rehab  Education need identified 04/12/22  Date 05/11/22  Educator [facility]  Instruction Review Code 1- Verbalizes Understanding       Education: Resistance Exercise: - Group verbal and visual presentation on the components of exercise prescription. Introduces F.I.T.T principle from ACSM for exercise prescriptions  Reviews F.I.T.T. principles of resistance exercise including progression. Written material given at graduation. Flowsheet Row Pulmonary Rehab from 06/15/2022 in [facility] Cardiac and Pulmonary Rehab  Date 05/18/22  Educator NT  Instruction Review Code 1- Verbalizes Understanding        Education: Exercise & Equipment Safety: - Individual verbal instruction and demonstration of equipment use and safety with use of the equipment. Flowsheet Row Pulmonary Rehab from 06/15/2022 in [facility] Cardiac  and Pulmonary Rehab  Date 04/10/22  Educator [facility]  Instruction Review Code 1- Verbalizes Understanding       Education: Exercise Physiology & General Exercise Guidelines: - Group verbal and written instruction with models to review the exercise physiology of the cardiovascular system and associated critical values. Provides general exercise guidelines with specific guidelines to those with heart or lung disease.    Education: Flexibility, Balance, Mind/Body Relaxation: - Group verbal and visual presentation with interactive activity on the components of exercise prescription. Introduces F.I.T.T principle from ACSM for exercise prescriptions. Reviews F.I.T.T. principles of flexibility and balance exercise training including progression. Also discusses the mind body connection.  Reviews various relaxation techniques to help reduce and manage stress (i.e. Deep breathing, progressive muscle relaxation, and visualization). Balance handout provided to take home. Written material given at graduation. Flowsheet Row Pulmonary Rehab from 06/15/2022 in [facility] Cardiac and Pulmonary Rehab  Date 05/18/22  Educator NT  Instruction Review Code 1- Verbalizes Understanding       Activity Barriers & Risk Stratification:  Activity Barriers & Cardiac Risk Stratification - 04/12/22 1154       Activity Barriers & Cardiac Risk Stratification   Activity Barriers Arthritis;Fibromyalgia;Joint Problems;Shortness of Breath;Muscular Weakness;Deconditioning;Balance Concerns;History of Falls;Assistive Device  chronic pain from fibromyalgia and RA in hands/shoulders/knees, fell at home on Sunday bruised left side            6 Minute Walk:  6 Minute Walk     Row Name 04/12/22 1141         6 Minute Walk   Phase Initial     Distance 840 feet     Walk Time 6 minutes     # of Rest Breaks 0     MPH 1.59     METS 1.86     RPE 17     Perceived Dyspnea  3     VO2 Peak 6.51     Symptoms Yes (comment)      Comments SOB, chronic hip pain     Resting HR 78 bpm     Resting BP 118/68     Resting Oxygen Saturation  94 %     Exercise Oxygen Saturation  during 6 min walk 89 %     Max Ex. HR 92 bpm     Max Ex. BP 146/74     2 Minute Post BP 136/72       Interval HR   1 Minute HR 86     2 Minute HR 91     3 Minute HR 90     4 Minute HR 91     5 Minute HR 92     6 Minute HR 92     2 Minute Post HR 78     Interval Heart Rate? Yes       Interval Oxygen   Interval Oxygen? Yes     Baseline Oxygen Saturation % 94 %     1 Minute Oxygen Saturation % 90 %     1 Minute Liters of Oxygen 0 L  Room Air     2 Minute Oxygen Saturation % 90 %     2 Minute Liters of Oxygen 0 L     3 Minute Oxygen Saturation % 89 %     3 Minute Liters of Oxygen 0 L     4 Minute Oxygen Saturation % 89 %     4 Minute Liters of Oxygen 0 L     5 Minute Oxygen Saturation % 90 %     5 Minute Liters of Oxygen 0 L     6 Minute Oxygen Saturation % 90 %     6 Minute Liters of Oxygen 0 L     2 Minute Post Oxygen Saturation % 94 %     2 Minute Post Liters of Oxygen 0 L             Oxygen Initial Assessment:  Oxygen Initial Assessment - 06/06/22 0803       Home Oxygen   Home Oxygen Device Home Concentrator    Sleep Oxygen Prescription Continuous;CPAP   new sleep study completed, waiting on results   Home Exercise Oxygen Prescription Continuous   prn   Liters per minute 2    Home Resting Oxygen Prescription Continuous   prn   Liters per minute 2    Compliance with Home Oxygen Use Yes   only prn     Program Oxygen Prescription   Program Oxygen Prescription None   Can use O2 prn     Intervention   Short Term Goals To learn and exhibit compliance with exercise, home and travel O2 prescription;To learn and understand importance of monitoring SPO2 with pulse oximeter and  demonstrate accurate use of the pulse oximeter.;To learn and understand importance of maintaining oxygen saturations>88%;To learn and demonstrate proper  pursed lip breathing techniques or other breathing techniques. ;To learn and demonstrate proper use of respiratory medications    Long  Term Goals Verbalizes importance of monitoring SPO2 with pulse oximeter and return demonstration;Exhibits proper breathing techniques, such as pursed lip breathing or other method taught during program session;Demonstrates proper use of MDI's;Compliance with respiratory medication;Maintenance of O2 saturations>88%;Exhibits compliance with exercise, home  and travel O2 prescription             Oxygen Re-Evaluation:  Oxygen Re-Evaluation     Row Name 04/25/22 0840 05/23/22 0756 06/06/22 0803 07/06/22 0804       Program Oxygen Prescription   Program Oxygen Prescription None None -- None      Home Oxygen   Home Oxygen Device Home Concentrator Home Concentrator -- Home Concentrator    Sleep Oxygen Prescription Continuous;CPAP Continuous;CPAP -- Continuous;CPAP    Liters per minute 0 0 -- 2    Home Exercise Oxygen Prescription -- Continuous -- None    Liters per minute 2 2 -- --    Home Resting Oxygen Prescription Continuous Continuous -- None    Liters per minute 2 2 -- --    Compliance with Home Oxygen Use -- No -- Yes  except CPAP, waiting for new mask      Goals/Expected Outcomes   Short Term Goals To learn and demonstrate proper pursed lip breathing techniques or other breathing techniques.  To learn and exhibit compliance with exercise, home and travel O2 prescription;To learn and understand importance of monitoring SPO2 with pulse oximeter and demonstrate accurate use of the pulse oximeter.;To learn and understand importance of maintaining oxygen saturations>88%;To learn and demonstrate proper pursed lip breathing techniques or other breathing techniques. ;To learn and demonstrate proper use of respiratory medications -- To learn and exhibit compliance with exercise, home and travel O2 prescription;To learn and understand importance of monitoring SPO2  with pulse oximeter and demonstrate accurate use of the pulse oximeter.;To learn and understand importance of maintaining oxygen saturations>88%;To learn and demonstrate proper pursed lip breathing techniques or other breathing techniques. ;To learn and demonstrate proper use of respiratory medications    Long  Term Goals -- Verbalizes importance of monitoring SPO2 with pulse oximeter and return demonstration;Exhibits proper breathing techniques, such as pursed lip breathing or other method taught during program session;Demonstrates proper use of MDI's;Compliance with respiratory medication;Maintenance of O2 saturations>88%;Exhibits compliance with exercise, home  and travel O2 prescription -- Verbalizes importance of monitoring SPO2 with pulse oximeter and return demonstration;Exhibits proper breathing techniques, such as pursed lip breathing or other method taught during program session;Demonstrates proper use of MDI's;Compliance with respiratory medication;Maintenance of O2 saturations>88%;Exhibits compliance with exercise, home  and travel O2 prescription    Comments Reviewed PLB technique with pt.  Talked about how it works and it's importance in maintaining their exercise saturations. Al is scheduled for a sleep study as his equipment has not been working well for him and is out of date. He tried to use his CPAP but it was putting too much pressure on his teeth and nose.  He has been wearing his oxygen at home.  His saturations are doing pretty good overall.  Walking will still drop him on occassion.  He is doing well with his PLB. Al continues to do well. He just had a completed a sleep study this past Sunday and is awaiting the results.  He is supposed to be getting a new mask this will fit him better with his CPAP. His oxygen saturations at rehab have been staying above 88% on RA. He continues to check them at home and uses oxygen as needed. Continues to use PLB. Al has not been able to use his CPAP at  night as his mask does not fit.  He is still waiting to hear back from doctor about it.  He is wearing oxygen at night.  He is not needing his oxygen during the day except on occassion when he feels short of breath and then he will use it to help recover.  He is good about using his PLB and inhalers.    Goals/Expected Outcomes Short: Become more profiecient at using PLB. Long: Become independent at using PLB. Short; Get sleep study done Long; Continue to use PLB Short: Get results from sleep study and follow doctor orders on CPAP Long: Continue to monitor O2 and use PLB long-term Shrot: Continue to use O2 as needed Long: conitnue to use PLB             Oxygen Discharge (Final Oxygen Re-Evaluation):  Oxygen Re-Evaluation - 07/06/22 0804       Program Oxygen Prescription   Program Oxygen Prescription None      Home Oxygen   Home Oxygen Device Home Concentrator    Sleep Oxygen Prescription Continuous;CPAP    Liters per minute 2    Home Exercise Oxygen Prescription None    Home Resting Oxygen Prescription None    Compliance with Home Oxygen Use Yes   except CPAP, waiting for new mask     Goals/Expected Outcomes   Short Term Goals To learn and exhibit compliance with exercise, home and travel O2 prescription;To learn and understand importance of monitoring SPO2 with pulse oximeter and demonstrate accurate use of the pulse oximeter.;To learn and understand importance of maintaining oxygen saturations>88%;To learn and demonstrate proper pursed lip breathing techniques or other breathing techniques. ;To learn and demonstrate proper use of respiratory medications    Long  Term Goals Verbalizes importance of monitoring SPO2 with pulse oximeter and return demonstration;Exhibits proper breathing techniques, such as pursed lip breathing or other method taught during program session;Demonstrates proper use of MDI's;Compliance with respiratory medication;Maintenance of O2 saturations>88%;Exhibits  compliance with exercise, home  and travel O2 prescription    Comments Al has not been able to use his CPAP at night as his mask does not fit.  He is still waiting to hear back from doctor about it.  He is wearing oxygen at night.  He is not needing his oxygen during the day except on occassion when he feels short of breath and then he will use it to help recover.  He is good about using his PLB and inhalers.    Goals/Expected Outcomes Shrot: Continue to use O2 as needed Long: conitnue to use PLB             Initial Exercise Prescription:  Initial Exercise Prescription - 04/12/22 1100       Date of Initial Exercise RX and Referring Provider   Date 04/12/22    Referring Provider Landry Dyke MD      Oxygen   Oxygen --   may need oxygen is desaturates with exercise   Maintain Oxygen Saturation 88% or higher      Recumbant Bike   Level 1    RPM 50    Watts 10    Minutes 15  METs 1.8      T5 Nustep   Level 1    SPM 80    Minutes 15    METs 1.8      Biostep-RELP   Level 1    SPM 50    Minutes 15    METs 2      Track   Laps 21    Minutes 15    METs 2.14      Prescription Details   Frequency (times per week) 2    Duration Progress to 30 minutes of continuous aerobic without signs/symptoms of physical distress      Intensity   THRR 40-80% of Max Heartrate 106-135    Ratings of Perceived Exertion 11-13    Perceived Dyspnea 0-4      Progression   Progression Continue to progress workloads to maintain intensity without signs/symptoms of physical distress.      Resistance Training   Training Prescription Yes    Weight 3 lb    Reps 10-15             Perform Capillary Blood Glucose checks as needed.  Exercise Prescription Changes:   Exercise Prescription Changes     Row Name 04/12/22 1100 05/04/22 1400 05/18/22 1200 06/01/22 0800 06/01/22 1400     Response to Exercise   Blood Pressure (Admit) 118/68 126/70 104/66 -- 142/70   Blood Pressure  (Exercise) 146/74 140/76 -- -- 136/76   Blood Pressure (Exit) 142/60 130/74 112/62 -- 124/62   Heart Rate (Admit) 78 bpm 88 bpm 72 bpm -- 72 bpm   Heart Rate (Exercise) 92 bpm 87 bpm 93 bpm -- 104 bpm   Heart Rate (Exit) 74 bpm 85 bpm 84 bpm -- 85 bpm   Oxygen Saturation (Admit) 94 % 90 % 96 % -- 94 %   Oxygen Saturation (Exercise) 89 % 92 % 91 % -- 90 %   Oxygen Saturation (Exit) 94 % 95 % 95 % -- 97 %   Rating of Perceived Exertion (Exercise) 17 15 16  -- 15   Perceived Dyspnea (Exercise) 3 3 3  -- 3   Symptoms SOB, hip pain none SOB -- SOB, dizziness   Comments walk test results First two days of exercise -- -- --   Duration -- Progress to 30 minutes of  aerobic without signs/symptoms of physical distress Progress to 30 minutes of  aerobic without signs/symptoms of physical distress -- Progress to 30 minutes of  aerobic without signs/symptoms of physical distress   Intensity -- THRR unchanged THRR unchanged -- THRR unchanged     Progression   Progression -- Continue to progress workloads to maintain intensity without signs/symptoms of physical distress. Continue to progress workloads to maintain intensity without signs/symptoms of physical distress. -- Continue to progress workloads to maintain intensity without signs/symptoms of physical distress.   Average METs -- 1.86 1.95 -- 1.67     Resistance Training   Training Prescription -- Yes Yes -- Yes   Weight -- 3 lb 3 lb -- 3 lb   Reps -- 10-15 10-15 -- 10-15     Interval Training   Interval Training -- No No -- No     Oxygen   Oxygen -- -- --  prn -- --     Recumbant Bike   Level -- 1 -- -- --   Watts -- 10 -- -- --   Minutes -- 15 -- -- --   METs -- 2.32 -- -- --  NuStep   Level -- 1 1 -- 1   Minutes -- 30 30 -- 15   METs -- 1.8 2 -- --     Biostep-RELP   Level -- -- 1 -- 2   Minutes -- -- 15 -- 30   METs -- -- 2 -- 2     Track   Laps -- -- 2  trying out to start -- 2  dizzy   Minutes -- -- 15 -- 15   METs -- --  1 -- 1     Home Exercise Plan   Plans to continue exercise at -- -- -- Home (comment)  walking,  recumbent bike Home (comment)  walking,  recumbent bike   Frequency -- -- -- Add 3 additional days to program exercise sessions. Add 3 additional days to program exercise sessions.   Initial Home Exercises Provided -- -- -- 06/01/22 06/01/22     Oxygen   Maintain Oxygen Saturation -- 88% or higher 88% or higher -- 88% or higher    Row Name 06/13/22 1500 06/28/22 0700 07/13/22 1300 07/26/22 0900 08/10/22 1400     Response to Exercise   Blood Pressure (Admit) 124/70 126/62 122/76 116/78 118/78   Blood Pressure (Exit) 120/68 130/80 118/78 114/70 110/74   Heart Rate (Admit) 78 bpm 71 bpm 93 bpm 75 bpm 73 bpm   Heart Rate (Exercise) 92 bpm 87 bpm 84 bpm 88 bpm 89 bpm   Heart Rate (Exit) 81 bpm 83 bpm 79 bpm 73 bpm 73 bpm   Oxygen Saturation (Admit) 95 % 96 % 92 % 95 % 97 %   Oxygen Saturation (Exercise) 93 % 93 % 94 % 92 % 92 %   Oxygen Saturation (Exit) 96 % 95 % 97 % 97 % 97 %   Rating of Perceived Exertion (Exercise) 15 15 13 12 14    Perceived Dyspnea (Exercise) 2 2 2 2 2    Symptoms SOB SOB SOB SOB SOB   Duration Continue with 30 min of aerobic exercise without signs/symptoms of physical distress. Continue with 30 min of aerobic exercise without signs/symptoms of physical distress. Continue with 30 min of aerobic exercise without signs/symptoms of physical distress. Continue with 30 min of aerobic exercise without signs/symptoms of physical distress. Continue with 30 min of aerobic exercise without signs/symptoms of physical distress.   Intensity THRR unchanged THRR unchanged THRR unchanged THRR unchanged THRR unchanged     Progression   Progression Continue to progress workloads to maintain intensity without signs/symptoms of physical distress. Continue to progress workloads to maintain intensity without signs/symptoms of physical distress. Continue to progress workloads to maintain intensity  without signs/symptoms of physical distress. Continue to progress workloads to maintain intensity without signs/symptoms of physical distress. Continue to progress workloads to maintain intensity without signs/symptoms of physical distress.   Average METs 2 2.18 2.35 2.18 2.25     Resistance Training   Training Prescription Yes Yes Yes Yes Yes   Weight 3 lb 3 lb 3 lb 3 lb 3 lb   Reps 10-15 10-15 10-15 10-15 10-15     Interval Training   Interval Training No No No No No     NuStep   Level 1 3 2 3 2    Minutes 30 30 30 30 30    METs 2.9 2.5 2.7 2.4 2.6     Biostep-RELP   Level 2 2 2 2 2    Minutes 30 30 30 30 30    METs 2 2 2  2  2     Home Exercise Plan   Plans to continue exercise at Home (comment)  walking,  recumbent bike Home (comment)  walking,  recumbent bike Home (comment)  walking,  recumbent bike Home (comment)  walking,  recumbent bike Home (comment)  walking,  recumbent bike   Frequency Add 3 additional days to program exercise sessions. Add 3 additional days to program exercise sessions. Add 3 additional days to program exercise sessions. Add 3 additional days to program exercise sessions. Add 3 additional days to program exercise sessions.   Initial Home Exercises Provided 06/01/22 06/01/22 06/01/22 06/01/22 06/01/22     Oxygen   Maintain Oxygen Saturation 88% or higher 88% or higher 88% or higher 88% or higher 88% or higher    Row Name 08/24/22 1000 09/07/22 0900 09/19/22 1400         Response to Exercise   Blood Pressure (Admit) 118/70 128/72 106/70     Blood Pressure (Exit) 116/68 132/80 96/60     Heart Rate (Admit) 81 bpm 85 bpm 81 bpm     Heart Rate (Exercise) 90 bpm 86 bpm 80 bpm     Heart Rate (Exit) 84 bpm 84 bpm 76 bpm     Oxygen Saturation (Admit) 91 % 94 % 94 %     Oxygen Saturation (Exercise) 96 % 93 % 92 %     Oxygen Saturation (Exit) 98 % 95 % 94 %     Rating of Perceived Exertion (Exercise) 12 12 12      Perceived Dyspnea (Exercise) 2 2 2      Symptoms  SOB SOB SOB     Duration Continue with 30 min of aerobic exercise without signs/symptoms of physical distress. Continue with 30 min of aerobic exercise without signs/symptoms of physical distress. Continue with 30 min of aerobic exercise without signs/symptoms of physical distress.     Intensity THRR unchanged THRR unchanged THRR unchanged       Progression   Progression Continue to progress workloads to maintain intensity without signs/symptoms of physical distress. Continue to progress workloads to maintain intensity without signs/symptoms of physical distress. Continue to progress workloads to maintain intensity without signs/symptoms of physical distress.     Average METs 2 2.5 2.35       Resistance Training   Training Prescription Yes Yes Yes     Weight 3 lb 3 lb 3 lb     Reps 10-15 10-15 10-15       Interval Training   Interval Training No No No       NuStep   Level -- 2 3     Minutes -- 15 15     METs -- 2.4 2.7       Biostep-RELP   Level 2 -- --     Minutes 30 -- --     METs 2 -- --       Home Exercise Plan   Plans to continue exercise at Home (comment)  walking,  recumbent bike Home (comment)  walking,  recumbent bike Home (comment)  walking,  recumbent bike     Frequency Add 3 additional days to program exercise sessions. Add 3 additional days to program exercise sessions. Add 3 additional days to program exercise sessions.     Initial Home Exercises Provided 06/01/22 06/01/22 06/01/22       Oxygen   Maintain Oxygen Saturation 88% or higher 88% or higher 88% or higher  Exercise Comments:   Exercise Goals and Review:   Exercise Goals     Row Name 04/12/22 1255             Exercise Goals   Increase Physical Activity Yes       Intervention Provide advice, education, support and counseling about physical activity/exercise needs.;Develop an individualized exercise prescription for aerobic and resistive training based on initial evaluation  findings, risk stratification, comorbidities and participant's personal goals.       Expected Outcomes Short Term: Attend rehab on a regular basis to increase amount of physical activity.;Long Term: Add in home exercise to make exercise part of routine and to increase amount of physical activity.;Long Term: Exercising regularly at least 3-5 days a week.       Increase Strength and Stamina Yes       Intervention Provide advice, education, support and counseling about physical activity/exercise needs.;Develop an individualized exercise prescription for aerobic and resistive training based on initial evaluation findings, risk stratification, comorbidities and participant's personal goals.       Expected Outcomes Short Term: Increase workloads from initial exercise prescription for resistance, speed, and METs.;Short Term: Perform resistance training exercises routinely during rehab and add in resistance training at home;Long Term: Improve cardiorespiratory fitness, muscular endurance and strength as measured by increased METs and functional capacity ( )       Able to understand and use rate of perceived exertion (RPE) scale Yes       Intervention Provide education and explanation on how to use RPE scale       Expected Outcomes Short Term: Able to use RPE daily in rehab to express subjective intensity level;Long Term:  Able to use RPE to guide intensity level when exercising independently       Able to understand and use Dyspnea scale Yes       Intervention Provide education and explanation on how to use Dyspnea scale       Expected Outcomes Short Term: Able to use Dyspnea scale daily in rehab to express subjective sense of shortness of breath during exertion;Long Term: Able to use Dyspnea scale to guide intensity level when exercising independently       Knowledge and understanding of Target Heart Rate Range (THRR) Yes       Intervention Provide education and explanation of THRR including how the numbers  were predicted and where they are located for reference       Expected Outcomes Short Term: Able to state/look up THRR;Short Term: Able to use daily as guideline for intensity in rehab;Long Term: Able to use THRR to govern intensity when exercising independently       Able to check pulse independently Yes       Intervention Provide education and demonstration on how to check pulse in carotid and radial arteries.;Review the importance of being able to check your own pulse for safety during independent exercise       Expected Outcomes Short Term: Able to explain why pulse checking is important during independent exercise;Long Term: Able to check pulse independently and accurately       Understanding of Exercise Prescription Yes       Intervention Provide education, explanation, and written materials on patient's individual exercise prescription       Expected Outcomes Short Term: Able to explain program exercise prescription;Long Term: Able to explain home exercise prescription to exercise independently                Exercise  Goals Re-Evaluation :  Exercise Goals Re-Evaluation     Row Name 04/25/22 0842 05/04/22 1433 05/18/22 1209 05/23/22 0749 06/01/22 0819     Exercise Goal Re-Evaluation   Exercise Goals Review Understanding of Exercise Prescription;Knowledge and understanding of Target Heart Rate Range (THRR);Able to understand and use Dyspnea scale;Able to understand and use rate of perceived exertion (RPE) scale Increase Physical Activity;Increase Strength and Stamina;Understanding of Exercise Prescription Increase Physical Activity;Increase Strength and Stamina;Understanding of Exercise Prescription Increase Physical Activity;Increase Strength and Stamina;Understanding of Exercise Prescription Increase Physical Activity;Increase Strength and Stamina;Understanding of Exercise Prescription;Able to understand and use rate of perceived exertion (RPE) scale;Able to understand and use Dyspnea  scale;Able to check pulse independently;Knowledge and understanding of Target Heart Rate Range (THRR)   Comments Reviewed RPE scale, THR and program prescription with pt today.  Pt voiced understanding and was given a copy of goals to take home. Emon is off to a good start in the program. He had an overall average MET level of 1.86 METs during his first two days of rehab. He also was able to tolerate 30 minutes on the T4 nustep at level 1. He tried to use the recumbent bike but had to switch back to the T4 nustep due to discomfort. We will continue to monitor his progress in the program. Al missed a couple sessions last review as he out town. He has been staying on the T4 Nustep as he is limited on fitting on machines. He also is limited by his breathing when walking. He tried out walking a couple laps on the track, we hope to that improve over time. His oxygen has been stayinh above 88%. We will continue to monitor. Al is doing well in rehab.  He has been using his bike/seated elliptical at home.  He will usually go for about 15-20 min.  We talked about continuing to build up his time to 30 min.  He is trying to get there.  He does feel like his stamina is starting to get better. Reviewed home exercise with pt today.  Pt plans to walk and use recumbent bike at home for exercise.  Reviewed THR, pulse, RPE, sign and symptoms, pulse oximetery and when to call 911 or MD.  Also discussed weather considerations and indoor options.  Pt voiced understanding.   Expected Outcomes Short: Use RPE daily to regulate intensity. Long: Follow program prescription in THR. Short: Continue to follow current exercise prescription. Long: Continue to improve strength and stamina. Short: Slowly add in more laps on track Long: Continue to increase overall MET level and stamina Short: Continue to add time in on bike at home Long: Conitnue to improve stamina Short: Start to add in exercise more at home Long: Continue to improve stamina     Row Name 06/01/22 1413 06/06/22 0757 06/13/22 1559 06/28/22 0737 07/06/22 0750     Exercise Goal Re-Evaluation   Exercise Goals Review Increase Physical Activity;Increase Strength and Stamina;Understanding of Exercise Prescription Increase Physical Activity;Increase Strength and Stamina;Understanding of Exercise Prescription Increase Physical Activity;Increase Strength and Stamina;Understanding of Exercise Prescription Increase Physical Activity;Increase Strength and Stamina;Understanding of Exercise Prescription Increase Physical Activity;Increase Strength and Stamina;Understanding of Exercise Prescription   Comments Tyerell is doing well in rehab. He has continued to try and walk the track but has been limited due to dizziness. He was able to improve to level 2 on the biostep and tolerated it for 30 minutes. He has continued to use 3 lb hand weights for resistance training  as well. We will continue to monitor his progress in the program. Al states he has been using his recumbent bike for exercise. He uses it for about 15 minutes at a time, we talked about slowly accumulating his time up, and taking breaks when needed to. He practices PLB when he needs to and checks his O2, he states his O2 stays above 88%.  He continues to check his HR. He is using light therapy to help with neuropathy 2x/day for 90 days. He has seen an improvement so far and is hoping it may help improve his blaance and walking. Al continues to come to rehab, has missed a couple sessions due to transportation issues. He usually walks to and from rehab and stays on seated machines while at rehab. He has been working at level 2 on [employer] but continues to exercise at level 1 on the T4 Nustep. He would benefit from also increasing that to level 2. He is still limited by his SOB when exercising, with an average RPE of 15. We will continue to monitor. Al is doing well in rehab, although he has only attended rehab twic since the last  review. He increased his overall average MET level to 2.18 METs. He also improved to level 3 on the T4 nustep and continued to work at level 2 on the biostep. His O2 saturations have stayed above 93% during exercise since the last review as well. We will continue to monitor his progress in the program. Al is doing well in rehab. He is up to 15-88min on his recumbent bike at home.  His stamina is not getting too much better.  He feels he has a lot to get done, but not able to do it all.  He is also doing PT and neurorehab for nerves in his legs.   Expected Outcomes Short: Continue to push for more laps on the track. Long: Continue to improve strength and stamina. Short: Slowly build up tolerance on recumbent bike, monitor HR and O2 during Long: Continue independent exercise at home at appropriate prescription Short: Increase to level 2 on the Nustep Long: Continue to increase overall MET level and stamina Short: Continue to progressively increase workloads. Long: Continue to increase strength and stamina. Short: Continue to rehab nerves Long: Conitnue to exercise independent    Row Name 07/13/22 1312 07/26/22 0915 08/10/22 1436 08/24/22 1009 09/07/22 0909     Exercise Goal Re-Evaluation   Exercise Goals Review Increase Physical Activity;Increase Strength and Stamina;Understanding of Exercise Prescription Increase Physical Activity;Increase Strength and Stamina;Understanding of Exercise Prescription Increase Physical Activity;Increase Strength and Stamina;Understanding of Exercise Prescription Increase Physical Activity;Increase Strength and Stamina;Understanding of Exercise Prescription Increase Physical Activity;Increase Strength and Stamina;Understanding of Exercise Prescription   Comments Al is doing wellin rehab.  He does 30 min on NuStep or BioStep each day and is now on level 2 for the full time.  We will continue to monitor his progress. Al is doing wellin rehab. He continues to do well with 30 min on  the T4 nustep or biostep each day. He also improved back up to level 3 on the T4 nustep, while continuing to work at level 2 on the biostep. He also has stayed consistent with 3 lb hand weights for resistance training. We will continue to monitor his progress in the program. Al is doing well in rehab. He continues to work for 30 minutes on the biostep and T4 nustep at level 2. He also continues to do well  with 3 lb hand weights for resistance training. We will continue to monitor his progress in the program. Al continues to maintain his workloads. He was absent 3 sessions this review period,  Will encuorage increasing workloads as tolerated. Will continue to monitor exercise progression with goal of increased stamina and strength. Al attended 1 of 4 sesions. He maintained his workloads. He continued to use 3 lb hand weights.   Will continue to encourage and monitor exercise  progression.   Expected Outcomes Short: Continue to increase workloads for longer periods of time Long: continue to improve stamina Short: Continue to progressively increase workloads for longer periods of time. Long: Continue to improve strength and stamina. Short: Try level 3 on the biostep, go back up to level 3 on the T4 nustep. Long: Continue to increase overall METs and stamina. STG Increase workloads as tolerated with goal of increased stamina and strength.  Try Biostep at level 3.  LTG Continued exercise progression during program and after discharge STG have Al incresae workloads as tolerated. Suggest intervals of higher level ,back to lower level until he can manage the new level for 15 minutes. LTG: Continued exercise progression as tolerated during program and after discharge.    Row Name 09/19/22 1500             Exercise Goal Re-Evaluation   Exercise Goals Review Increase Physical Activity;Increase Strength and Stamina;Understanding of Exercise Prescription       Comments Al is doing well in rehab. He has only attended  one session since the time of the last review. His average METs did decrease to 2.35 METs. He did however increase his level on the T4 nustep from 2 to 3. We will continue to monitor his progress in the program.       Expected Outcomes Short: Return to regular attendance in the program. Long: Continue exercise to improve strength and stamina.                Discharge Exercise Prescription (Final Exercise Prescription Changes):  Exercise Prescription Changes - 09/19/22 1400       Response to Exercise   Blood Pressure (Admit) 106/70    Blood Pressure (Exit) 96/60    Heart Rate (Admit) 81 bpm    Heart Rate (Exercise) 80 bpm    Heart Rate (Exit) 76 bpm    Oxygen Saturation (Admit) 94 %    Oxygen Saturation (Exercise) 92 %    Oxygen Saturation (Exit) 94 %    Rating of Perceived Exertion (Exercise) 12    Perceived Dyspnea (Exercise) 2    Symptoms SOB    Duration Continue with 30 min of aerobic exercise without signs/symptoms of physical distress.    Intensity THRR unchanged      Progression   Progression Continue to progress workloads to maintain intensity without signs/symptoms of physical distress.    Average METs 2.35      Resistance Training   Training Prescription Yes    Weight 3 lb    Reps 10-15      Interval Training   Interval Training No      NuStep   Level 3    Minutes 15    METs 2.7      Home Exercise Plan   Plans to continue exercise at Home (comment)   walking,  recumbent bike   Frequency Add 3 additional days to program exercise sessions.    Initial Home Exercises Provided 06/01/22      Oxygen  Maintain Oxygen Saturation 88% or higher             Nutrition:  Target Goals: Understanding of nutrition guidelines, daily intake of sodium 1500mg , cholesterol 200mg , calories 30% from fat and 7% or less from saturated fats, daily to have 5 or more servings of fruits and vegetables.  Education: All About Nutrition: -Group instruction provided by  verbal, written material, interactive activities, discussions, models, and posters to present general guidelines for heart healthy nutrition including fat, fiber, MyPlate, the role of sodium in heart healthy nutrition, utilization of the nutrition label, and utilization of this knowledge for meal planning. Follow up email sent as well. Written material given at graduation. Flowsheet Row Pulmonary Rehab from 06/15/2022 in [facility]  Education need identified 04/12/22       Biometrics:  Pre Biometrics - 04/12/22 1256       Pre Biometrics   Height 5\' 10"  (1.778 m)    Weight 219 lb 11.2 oz (99.7 kg)    Waist Circumference 41 inches    Hip Circumference 40 inches    Waist to Hip Ratio 1.03 %    BMI (Calculated) 31.52    Single Leg Stand 1.4 seconds              Nutrition Therapy Plan and Nutrition Goals:  Nutrition Therapy & Goals - 06/06/22 0825       Nutrition Therapy   RD appointment deferred Yes             Nutrition Assessments:  MEDIFICTS Score Key: ?70 Need to make dietary changes  40-70 Heart Healthy Diet ? 40 Therapeutic Level Cholesterol Diet  Flowsheet Row Pulmonary Rehab from 04/12/2022 in [facility]  Picture Your Plate Total Score on Admission 44      Picture Your Plate Scores: <62 Unhealthy dietary pattern with much room for improvement. 41-50 Dietary pattern unlikely to meet recommendations for good health and room for improvement. 51-60 More healthful dietary pattern, with some room for improvement.  >60 Healthy dietary pattern, although there may be some specific behaviors that could be improved.   Nutrition Goals Re-Evaluation:  Nutrition Goals Re-Evaluation     Row Name 05/23/22 0751 06/06/22 0811 07/06/22 0759         Goals   Nutrition Goal Work on mechanical eating -- Short: Continue to work on [employer] Long: Continue to eat pulmonary healthy based diet     Comment Al says that  he is not hungry often but is eating some.  We talked about getting in at least two meals a day.  We talked about using meal replacement bars/shakes to help.  We talked about making sure he gets in enough calories each day. Patient has deferred talking to a RD at this time. He is working on trying to eat several smaller meals/ day and eating enough calories and macronutrients to meet his needs. Al is doing well in rehab.  He is eating small meals but feels that has become his new normal. He is trying to get in protein and varitey.     Expected Outcome Short: Try meal replacement options Long: conitnue to improve diet Short: Continue to work on [employer] Long: Continue to eat pulmonary healthy based diet Continue to work on eating around the clock.              Nutrition Goals Discharge (Final Nutrition Goals Re-Evaluation):  Nutrition Goals Re-Evaluation - 07/06/22 1308  Goals   Nutrition Goal Short: Continue to work on smaller/frequent meals Long: Continue to eat pulmonary healthy based diet    Comment Al is doing well in rehab.  He is eating small meals but feels that has become his new normal. He is trying to get in protein and varitey.    Expected Outcome Continue to work on eating around the clock.             Psychosocial: Target Goals: Acknowledge presence or absence of significant depression and/or stress, maximize coping skills, provide positive support system. Participant is able to verbalize types and ability to use techniques and skills needed for reducing stress and depression.   Education: Stress, Anxiety, and Depression - Group verbal and visual presentation to define topics covered.  Reviews how body is impacted by stress, anxiety, and depression.  Also discusses healthy ways to reduce stress and to treat/manage anxiety and depression.  Written material given at graduation. Flowsheet Row Pulmonary Rehab from 06/15/2022 in [facility] Cardiac and Pulmonary Rehab   Date 04/27/22  Educator [facility]  Instruction Review Code 1- [employer] Understanding       Education: Sleep Hygiene -Provides group verbal and written instruction about how sleep can affect your health.  Define sleep hygiene, discuss sleep cycles and impact of sleep habits. Review good sleep hygiene tips.    Initial Review & Psychosocial Screening:  Initial Psych Review & Screening - 04/10/22 0956       Initial Review   Current issues with Current Anxiety/Panic;History of Depression;Current Psychotropic Meds;Current Sleep Concerns      Family Dynamics   Good Support System? Yes    Comments Darryn states that his health and pain levels are the source of his depression and anxiety. He states he feels bad all the time. Al can look to his wife, son and other family for support.      Barriers   Psychosocial barriers to participate in program The patient should benefit from training in stress management and relaxation.      Screening Interventions   Interventions Encouraged to exercise;To provide support and resources with identified psychosocial needs;Provide feedback about the scores to participant    Expected Outcomes Short Term goal: Utilizing psychosocial counselor, staff and physician to assist with identification of specific Stressors or current issues interfering with healing process. Setting desired goal for each stressor or current issue identified.;Long Term Goal: Stressors or current issues are controlled or eliminated.;Short Term goal: Identification and review with participant of any Quality of Life or Depression concerns found by scoring the questionnaire.;Long Term goal: The participant improves quality of Life and PHQ9 Scores as seen by post scores and/or verbalization of changes             Quality of Life Scores:  Scores of 19 and below usually indicate a poorer quality of life in these areas.  A difference of  2-3 points is a clinically meaningful difference.  A  difference of 2-3 points in the total score of the Quality of Life Index has been associated with significant improvement in overall quality of life, self-image, physical symptoms, and general health in studies assessing change in quality of life.  PHQ-9: Review Flowsheet       05/11/2022 04/12/2022  Depression screen PHQ 2/9  Decreased Interest 1 2  Down, Depressed, Hopeless 1 1  PHQ - 2 Score 2 3  Altered sleeping 3 2  Tired, decreased energy 3 2  Change in appetite 3 2  Feeling  bad or failure about yourself  1 0  Trouble concentrating 0 0  Moving slowly or fidgety/restless 0 1  Suicidal thoughts 0 0  PHQ-9 Score 12 10  Difficult doing work/chores Somewhat difficult Somewhat difficult    Details           Interpretation of Total Score  Total Score Depression Severity:  1-4 = Minimal depression, 5-9 = Mild depression, 10-14 = Moderate depression, 15-19 = Moderately severe depression, 20-27 = Severe depression   Psychosocial Evaluation and Intervention:  Psychosocial Evaluation - 04/10/22 0958       Psychosocial Evaluation & Interventions   Interventions Encouraged to exercise with the program and follow exercise prescription;Relaxation education;Stress management education    Comments Hasib states that his health and pain levels are the source of his depression and anxiety. He states he feels bad all the time. Al can look to his wife, son and other family for support.    Expected Outcomes Short: Start [facility] to help with mood. Long: Maintain a healthy mental state.    Continue Psychosocial Services  Follow up required by staff             Psychosocial Re-Evaluation:  Psychosocial Re-Evaluation     Row Name 05/11/22 0809 06/06/22 0813 07/06/22 6213         Psychosocial Re-Evaluation   Current issues with Current Sleep Concerns Current Stress Concerns Current Stress Concerns     Comments Reviewed patient health questionnaire (PHQ-9) with patient for follow  up. Previously, patients score indicated signs/symptoms of depression.  Reviewed to see if patient is improving symptom wise while in program.  Score declined and patient states that it is because he hasnt been sleeping well and has little energy. Al just completed a new sleep study this last Sunday to get a new CPAP, getting fit for a new mask. He is still waiting on the results. Wakes up from chronic pain at night which he following pain management. His biggest stressor is that his roof is leaking and may need a new one costing him a financial burden. Coming to rehab helps him get his mind off of things and hopes to continue to see improvement. Al is doing well in rehab.  He is still getting frustruted with not being able to get every thing done.  He is going to pulm rehab and neuro rehab as well.  He is still taking his supplements. He is still waiting on his sleep study to be reviewed with him to get new mask. He is still in process of getting new roof put on and he is still paying for it as well.  He is getting in his exercise and hoping to feel better once it is all done.     Expected Outcomes Short: Continue to work toward an improvement in PHQ9 scores by attending [facility] regularly. Long: Continue to improve stress and depression coping skills by talking with staff and attending [facility] regularly and work toward a positive mental state. Short: Get results from sleep study Long: Continue to utilize exercise for stress management and positive attitude Short: Continue to try to hear back about sleep study Long: Conitnue to improve stamina and exercise for mental boost     Interventions Encouraged to attend Pulmonary Rehabilitation for the exercise Encouraged to attend Pulmonary Rehabilitation for the exercise Encouraged to attend Pulmonary Rehabilitation for the exercise     Continue Psychosocial Services  Follow up required by staff Follow up required by staff Follow  up required by staff               Psychosocial Discharge (Final Psychosocial Re-Evaluation):  Psychosocial Re-Evaluation - 07/06/22 2585       Psychosocial Re-Evaluation   Current issues with Current Stress Concerns    Comments Al is doing well in rehab.  He is still getting frustruted with not being able to get every thing done.  He is going to pulm rehab and neuro rehab as well.  He is still taking his supplements. He is still waiting on his sleep study to be reviewed with him to get new mask. He is still in process of getting new roof put on and he is still paying for it as well.  He is getting in his exercise and hoping to feel better once it is all done.    Expected Outcomes Short: Continue to try to hear back about sleep study Long: Conitnue to improve stamina and exercise for mental boost    Interventions Encouraged to attend Pulmonary Rehabilitation for the exercise    Continue Psychosocial Services  Follow up required by staff             Education: Education Goals: Education classes will be provided on a weekly basis, covering required topics. Participant will state understanding/return demonstration of topics presented.  Learning Barriers/Preferences:  Learning Barriers/Preferences - 04/12/22 1257       Learning Barriers/Preferences   Learning Barriers Hearing    Learning Preferences None             General Pulmonary Education Topics:  Infection Prevention: - Provides verbal and written material to individual with discussion of infection control including proper hand washing and proper equipment cleaning during exercise session. Flowsheet Row Pulmonary Rehab from 06/15/2022 in [facility] Cardiac and Pulmonary Rehab  Date 04/10/22  Educator [facility]  Instruction Review Code 1- Verbalizes Understanding       Falls Prevention: - Provides verbal and written material to individual with discussion of falls prevention and safety. Flowsheet Row Pulmonary Rehab from 06/15/2022 in [facility] Cardiac and Pulmonary  Rehab  Date 04/10/22  Educator [facility]  Instruction Review Code 1- Verbalizes Understanding       Chronic Lung Disease Review: - Group verbal instruction with posters, models, PowerPoint presentations and videos,  to review new updates, new respiratory medications, new advancements in procedures and treatments. Providing information on websites and "800" numbers for continued self-education. Includes information about supplement oxygen, available portable oxygen systems, continuous and intermittent flow rates, oxygen safety, concentrators, and Medicare reimbursement for oxygen. Explanation of Pulmonary Drugs, including class, frequency, complications, importance of spacers, rinsing mouth after steroid MDI's, and proper cleaning methods for nebulizers. Review of basic lung anatomy and physiology related to function, structure, and complications of lung disease. Review of risk factors. Discussion about methods for diagnosing sleep apnea and types of masks and machines for OSA. Includes a review of the use of types of environmental controls: home humidity, furnaces, filters, dust mite/pet prevention, HEPA vacuums. Discussion about weather changes, air quality and the benefits of nasal washing. Instruction on Warning signs, infection symptoms, calling MD promptly, preventive modes, and value of vaccinations. Review of effective airway clearance, coughing and/or vibration techniques. Emphasizing that all should Create an Action Plan. Written material given at graduation. Flowsheet Row Pulmonary Rehab from 06/15/2022 in [facility] Cardiac and Pulmonary Rehab  Education need identified 04/12/22       AED/CPR: - Group verbal and written instruction with the use of models  to demonstrate the basic use of the AED with the basic ABC's of resuscitation.    Anatomy and Cardiac Procedures: - Group verbal and visual presentation and models provide information about basic cardiac anatomy and function. Reviews the testing  methods done to diagnose heart disease and the outcomes of the test results. Describes the treatment choices: Medical Management, Angioplasty, or Coronary Bypass Surgery for treating various heart conditions including Myocardial Infarction, Angina, Valve Disease, and Cardiac Arrhythmias.  Written material given at graduation.   Medication Safety: - Group verbal and visual instruction to review commonly prescribed medications for heart and lung disease. Reviews the medication, class of the drug, and side effects. Includes the steps to properly store meds and maintain the prescription regimen.  Written material given at graduation. Flowsheet Row Pulmonary Rehab from 06/15/2022 in [facility] Cardiac and Pulmonary Rehab  Date 06/15/22  Educator [facility]  Instruction Review Code 1- Verbalizes Understanding       Other: -Provides group and verbal instruction on various topics (see comments)   Knowledge Questionnaire Score:  Knowledge Questionnaire Score - 04/12/22 1257       Knowledge Questionnaire Score   Pre Score 11/18              Core Components/Risk Factors/Patient Goals at Admission:  Personal Goals and Risk Factors at Admission - 04/12/22 1257       Core Components/Risk Factors/Patient Goals on Admission    Weight Management Yes;Weight Loss;Obesity    Intervention Weight Management: Develop a combined nutrition and exercise program designed to reach desired caloric intake, while maintaining appropriate intake of nutrient and fiber, sodium and fats, and appropriate energy expenditure required for the weight goal.;Weight Management: Provide education and appropriate resources to help participant work on and attain dietary goals.;Weight Management/Obesity: Establish reasonable short term and long term weight goals.;Obesity: Provide education and appropriate resources to help participant work on and attain dietary goals.    Admit Weight 219 lb 11.2 oz (99.7 kg)    Goal Weight: Short Term 214 lb  (97.1 kg)    Goal Weight: Long Term 200 lb (90.7 kg)    Expected Outcomes Short Term: Continue to assess and modify interventions until short term weight is achieved;Long Term: Adherence to nutrition and physical activity/exercise program aimed toward attainment of established weight goal;Weight Loss: Understanding of general recommendations for a balanced deficit meal plan, which promotes 1-2 lb weight loss per week and includes a negative energy balance of [phone] kcal/d;Understanding recommendations for meals to include 15-35% energy as protein, 25-35% energy from fat, 35-60% energy from carbohydrates, less than 200mg  of dietary cholesterol, 20-35 gm of total fiber daily;Understanding of distribution of calorie intake throughout the day with the consumption of 4-5 meals/snacks    Improve shortness of breath with ADL's Yes    Intervention Provide education, individualized exercise plan and daily activity instruction to help decrease symptoms of SOB with activities of daily living.    Expected Outcomes Short Term: Improve cardiorespiratory fitness to achieve a reduction of symptoms when performing ADLs;Long Term: Be able to perform more ADLs without symptoms or delay the onset of symptoms    Increase knowledge of respiratory medications and ability to use respiratory devices properly  Yes    Intervention Provide education and demonstration as needed of appropriate use of medications, inhalers, and oxygen therapy.    Expected Outcomes Short Term: Achieves understanding of medications use. Understands that oxygen is a medication prescribed by physician. Demonstrates appropriate use of inhaler and oxygen therapy.;Long  Term: Maintain appropriate use of medications, inhalers, and oxygen therapy.    Diabetes Yes    Intervention Provide education about signs/symptoms and action to take for hypo/hyperglycemia.;Provide education about proper nutrition, including hydration, and aerobic/resistive exercise  prescription along with prescribed medications to achieve blood glucose in normal ranges: Fasting glucose 65-99 mg/dL    Expected Outcomes Short Term: Participant verbalizes understanding of the signs/symptoms and immediate care of hyper/hypoglycemia, proper foot care and importance of medication, aerobic/resistive exercise and nutrition plan for blood glucose control.;Long Term: Attainment of HbA1C < 7%.    Hypertension Yes    Intervention Provide education on lifestyle modifcations including regular physical activity/exercise, weight management, moderate sodium restriction and increased consumption of fresh fruit, vegetables, and low fat dairy, alcohol moderation, and smoking cessation.;Monitor prescription use compliance.    Expected Outcomes Short Term: Continued assessment and intervention until BP is < 140/44mm HG in hypertensive participants. < 130/50mm HG in hypertensive participants with diabetes, heart failure or chronic kidney disease.;Long Term: Maintenance of blood pressure at goal levels.    Intervention Provide education and support for participant on nutrition & aerobic/resistive exercise along with prescribed medications to achieve LDL 70mg , HDL >40mg .    Expected Outcomes Short Term: Participant states understanding of desired cholesterol values and is compliant with medications prescribed. Participant is following exercise prescription and nutrition guidelines.;Long Term: Cholesterol controlled with medications as prescribed, with individualized exercise RX and with personalized nutrition plan. Value goals: LDL < 70mg , HDL > 40 mg.             Education:Diabetes - Individual verbal and written instruction to review signs/symptoms of diabetes, desired ranges of glucose level fasting, after meals and with exercise. Acknowledge that pre and post exercise glucose checks will be done for 3 sessions at entry of program. Flowsheet Row Pulmonary Rehab from 06/15/2022 in [facility] Cardiac and  Pulmonary Rehab  Date 04/10/22  Educator [facility]  Instruction Review Code 1- Verbalizes Understanding       Know Your Numbers and Heart Failure: - Group verbal and visual instruction to discuss disease risk factors for cardiac and pulmonary disease and treatment options.  Reviews associated critical values for Overweight/Obesity, Hypertension, Cholesterol, and Diabetes.  Discusses basics of heart failure: signs/symptoms and treatments.  Introduces Heart Failure Zone chart for action plan for heart failure.  Written material given at graduation.   Core Components/Risk Factors/Patient Goals Review:   Goals and Risk Factor Review     Row Name 05/23/22 0753 06/06/22 0805 07/06/22 0801         Core Components/Risk Factors/Patient Goals Review   Personal Goals Review Weight Management/Obesity;Improve shortness of breath with ADL's;Increase knowledge of respiratory medications and ability to use respiratory devices properly.;Diabetes;Hypertension Weight Management/Obesity;Improve shortness of breath with ADL's;Increase knowledge of respiratory medications and ability to use respiratory devices properly.;Diabetes;Hypertension Weight Management/Obesity;Improve shortness of breath with ADL's;Increase knowledge of respiratory medications and ability to use respiratory devices properly.;Diabetes;Hypertension     Review Al is doing well in rehab.  His weight is trending down and is down to 211 here and 205 at home.  He is pleased with his progress.  His blood pressures are doing well haning around 130s/80s.  His sugars have been around 130s as well.  He checks both daily at home.  His breathing is rough today.  Overall, it has been getting better. He is using his inhaler and nebulizer each day. Al states he feels like he's doing a lot at home at the moment as  he is participating in light therapy for his neuropathy in addition to his home exercise. He has good and bad breathing days and continues to monitor his O2  and using his oxygen prn. He is taking all medications OK. Continues to use his inhaler and nebulizer. He is now able to walk down here using his walker. BP has been running nicely at home and rehab, ranging around 120/70s. He checks it daily. So far he has maintained his weight. Overall he has lost 20 lbs in 3 months from Ozempic and wants to be under 200 lb. Today he was around 210 lb. He checks his  blood sugar every morning which is ranging around 130 every day, where his MD wants it to be. Al is doing well in rehab.  His weight goes up and down, but he thinks that it is trending down overall.  He is down 20 lb since all of this started.  His pressures are doing well and his sugars continue to do well.  He is good about using his inhalers.  His breathing is still hanging around about the same, but his is also limited his pain too. He is doing well with using his PLB.     Expected Outcomes Short: Continue to use breathing treatments on bad breathing days Long: Conitnue to work on weight loss Short: Continue to work on weight loss and monitor BP/BS Long: Work toward weight goal under 200 lb Short: Continue to work on [employer] loss Long: Continue to monitor risk factors.              Core Components/Risk Factors/Patient Goals at Discharge (Final Review):   Goals and Risk Factor Review - 07/06/22 0801       Core Components/Risk Factors/Patient Goals Review   Personal Goals Review Weight Management/Obesity;Improve shortness of breath with ADL's;Increase knowledge of respiratory medications and ability to use respiratory devices properly.;Diabetes;Hypertension    Review Al is doing well in rehab.  His weight goes up and down, but he thinks that it is trending down overall.  He is down 20 lb since all of this started.  His pressures are doing well and his sugars continue to do well.  He is good about using his inhalers.  His breathing is still hanging around about the same, but his is also limited his pain  too. He is doing well with using his PLB.    Expected Outcomes Short: Continue to work on weight loss Long: Continue to monitor risk factors.             ITP Comments:  ITP Comments     Row Name 04/10/22 1610 04/12/22 1141 04/25/22 0840 05/03/22 0927 05/31/22 1349   ITP Comments Virtual Visit completed. Patient informed on EP and RD appointment and 6 Minute walk test. Patient also informed of patient health questionnaires on My Chart. Patient Verbalizes understanding. Visit diagnosis can be found in [facility] 02/01/2022. Completed and gym orientation. Initial ITP created and sent for review to Dr. Jinny Sanders, Medical Director. First full day of exercise!  Patient was oriented to gym and equipment including functions, settings, policies, and procedures.  Patient's individual exercise prescription and treatment plan were reviewed.  All starting workloads were established based on the results of the 6 minute walk test done at initial orientation visit.  The plan for exercise progression was also introduced and progression will be customized based on patient's performance and goals. 30 Day review completed. Medical Director ITP review done, changes  made as directed, and signed approval by Medical Director.     new to program 30 day review completed. ITP sent to Dr. Jinny Sanders, Medical Director of  Pulmonary Rehab. Continue with ITP unless changes are made by physician.    Row Name 06/28/22 0934 07/26/22 1124 08/22/22 1458 09/20/22 1200     ITP Comments 30 Day review completed. Medical Director ITP review done, changes made as directed, and signed approval by Medical Director. 30 Day review completed. Medical Director ITP review done, changes made as directed, and signed approval by Medical Director. 30 Day review completed. Medical Director ITP review done, changes made as directed, and signed approval by Medical Director. 30 Day review completed. Medical Director ITP review done, changes made as  directed, and signed approval by Medical Director.             Comments:

## 2022-09-26 ENCOUNTER — Encounter: Payer: Medicare Other | Attending: Pulmonary Disease | Admitting: *Deleted

## 2022-09-26 DIAGNOSIS — Z87891 Personal history of nicotine dependence: Secondary | ICD-10-CM | POA: Diagnosis not present

## 2022-09-26 DIAGNOSIS — D869 Sarcoidosis, unspecified: Secondary | ICD-10-CM | POA: Diagnosis present

## 2022-09-26 DIAGNOSIS — J449 Chronic obstructive pulmonary disease, unspecified: Secondary | ICD-10-CM | POA: Diagnosis not present

## 2022-09-26 NOTE — Progress Notes (Signed)
Daily Session Note  Patient Details  Name: ABHINAV SLONE MRN: [id number] Date of Birth: [birth year] Referring Provider:   Flowsheet Row Pulmonary Rehab from 04/12/2022 in [facility] Cardiac and Pulmonary Rehab  Referring Provider Landry Dyke MD       Encounter Date: 09/26/2022  Check In:  Session Check In - 09/26/22 0829       Check-In   Supervising physician immediately available to respond to emergencies See telemetry face sheet for immediately available ER MD    Location [facility]-Cardiac & Pulmonary Rehab    Staff Present Cora Collum, RN, BSN, CCRP;Noah Tickle, BS, Exercise Physiologist;Other   Maxon Conetta BS, Exercise Physiologist   Virtual Visit No    Medication changes reported     No    Fall or balance concerns reported    No    Warm-up and Cool-down Performed on first and last piece of equipment    Resistance Training Performed Yes    VAD Patient? No    PAD/SET Patient? No      Pain Assessment   Currently in Pain? No/denies                Social History   Tobacco Use  Smoking Status Former   Current packs/day: 0.00   Average packs/day: 0.3 packs/day for 8.0 years (2.0 ttl pk-yrs)   Types: Cigarettes   Start date: 03/16/1969   Quit date: 03/16/1977   Years since quitting: 45.5   Passive exposure: Past  Smokeless Tobacco Former    Goals Met:  Proper associated with RPD/PD & O2 Sat [place] with exercise equipment Exercise tolerated well No report of concerns or symptoms today  Goals Unmet:  Not Applicable  Comments: Pt able to follow exercise prescription today without complaint.  Will continue to monitor for progression.    Dr. Bethann Punches is Medical Director for [facility] Cardiac Rehabilitation.  Dr. Vida Rigger is Medical Director for [facility] Pulmonary Rehabilitation.

## 2022-09-28 ENCOUNTER — Encounter: Payer: Medicare Other | Admitting: *Deleted

## 2022-09-28 DIAGNOSIS — D869 Sarcoidosis, unspecified: Secondary | ICD-10-CM

## 2022-09-28 NOTE — Progress Notes (Signed)
Daily Session Note  Patient Details  Name: Ethan Frye MRN: [id number] Date of Birth: [birth year] Referring Provider:   Flowsheet Row Pulmonary Rehab from 04/12/2022 in [facility]  Referring Provider Landry Dyke MD       Encounter Date: 09/28/2022  Check In:  Session Check In - 09/28/22 0802       Check-In   Supervising physician immediately available to respond to emergencies See telemetry face sheet for immediately available ER MD    Location [facility]-Card[facility] & Pul[facility]    Staff Present Ronette Deter, BS, Exercise Physiologist;Maxon Conetta BS, , Exercise Physiologist;Mariene Dickerman Katrinka Blazing, RN, ADN    Virtual Visit No    Medication changes reported     No    Fall or balance concerns reported    No    Warm-up and Cool-down Performed on first and last piece of equipment    Resistance Training Performed Yes    VAD Patient? No    PAD/SET Patient? No      Pain Assessment   Currently in Pain? No/denies                Social History   Tobacco Use  Smoking Status Former   Current packs/day: 0.00   Average packs/day: 0.3 packs/day for 8.0 years (2.0 ttl pk-yrs)   Types: Cigarettes   Start date: 03/16/1969   Quit date: 03/16/1977   Years since quitting: 45.5   Passive exposure: Past  Smokeless Tobacco Former    Goals Met:  Independence with exercise equipment Exercise tolerated well No report of concerns or symptoms today Strength training completed today  Goals Unmet:  Not Applicable  Comments: Pt able to follow exercise prescription today without complaint.  Will continue to monitor for progression.    Dr. Bethann Punches is Medical Director for [facility].  Dr. Vida Rigger is Medical Director for [facility] Pul[facility].

## 2022-10-17 ENCOUNTER — Encounter: Payer: Medicare Other | Attending: Pulmonary Disease | Admitting: *Deleted

## 2022-10-17 DIAGNOSIS — D869 Sarcoidosis, unspecified: Secondary | ICD-10-CM | POA: Insufficient documentation

## 2022-10-17 DIAGNOSIS — Z87891 Personal history of nicotine dependence: Secondary | ICD-10-CM | POA: Insufficient documentation

## 2022-10-17 DIAGNOSIS — J449 Chronic obstructive pulmonary disease, unspecified: Secondary | ICD-10-CM | POA: Insufficient documentation

## 2022-10-18 ENCOUNTER — Encounter: Payer: Self-pay | Admitting: *Deleted

## 2022-10-18 DIAGNOSIS — D869 Sarcoidosis, unspecified: Secondary | ICD-10-CM

## 2022-10-18 NOTE — Progress Notes (Signed)
Pulmonary Individual Treatment Plan  Patient Details  Name: Ethan Frye MRN: [id number] Date of Birth: [birth year] Referring Provider:   Flowsheet Row Pulmonary Rehab from 04/12/2022 in [facility]  Referring Provider Landry Dyke MD       Initial Encounter Date:  Flowsheet Row Pulmonary Rehab from 04/12/2022 in [facility]  Date 04/12/22       Visit Diagnosis: Sarcoidosis  Patient's Home Medications on Admission:  Current Outpatient Medications:    acidophilus (RISAQUAD) CAPS capsule, Take 1 capsule by mouth daily., Disp: , Rfl:    albuterol (PROVENTIL) (2.5 MG/3ML) 0.083% nebulizer solution, Take 2.5 mg by nebulization every 6 (six) hours as needed for wheezing or shortness of breath. (Patient not taking: Reported on 04/10/2022), Disp: , Rfl:    albuterol (VENTOLIN HFA) 108 (90 Base) MCG/ACT inhaler, Inhale 2 puffs into the lungs every 6 (six) hours as needed for wheezing or shortness of breath., Disp: , Rfl:    aspirin EC 81 MG tablet, Take 81 mg by mouth daily. Swallow whole., Disp: , Rfl:    aspirin EC 81 MG tablet, Take by mouth. (Patient not taking: Reported on 04/10/2022), Disp: , Rfl:    atorvastatin (LIPITOR) 20 MG tablet, Take 20 mg by mouth daily. (Patient not taking: Reported on 04/10/2022), Disp: , Rfl:    atorvastatin (LIPITOR) 40 MG tablet, Take by mouth., Disp: , Rfl:    azithromycin (ZITHROMAX) 500 MG tablet, Take 500 mg by mouth every Monday, Wednesday, and Friday. (Patient not taking: Reported on 04/10/2022), Disp: , Rfl:    Blood Glucose Monitoring Suppl (FIFTY50 GLUCOSE METER 2.0) w/Device KIT, See admin instructions., Disp: , Rfl:    budesonide (PULMICORT) 0.5 MG/2ML nebulizer solution, Inhale into the lungs., Disp: , Rfl:    carvedilol (COREG) 25 MG tablet, Take 25 mg by mouth 2 (two) times daily. (Patient not taking: Reported on 04/10/2022), Disp: , Rfl:    carvedilol (COREG) 25 MG tablet, Take 1 tablet by mouth 2  (two) times daily with a meal., Disp: , Rfl:    celecoxib (CELEBREX) 200 MG capsule, Take 200 mg by mouth daily as needed for pain. (Patient not taking: Reported on 04/10/2022), Disp: , Rfl:    celecoxib (CELEBREX) 200 MG capsule, Take by mouth., Disp: , Rfl:    cetirizine (ZYRTEC) 10 MG tablet, Take 10 mg by mouth daily., Disp: , Rfl:    cholecalciferol (VITAMIN D) 25 MCG (1000 UNIT) tablet, Take 1,000 Units by mouth daily., Disp: , Rfl:    clonazePAM (KLONOPIN) 1 MG tablet, Take 1 mg by mouth 2 (two) times daily. (Patient not taking: Reported on 04/10/2022), Disp: , Rfl:    clopidogrel (PLAVIX) 75 MG tablet, clopidogrel 75 mg tablet  TAKE 1 TABLET (75 MG TOTAL) BY MOUTH ONCE DAILY., Disp: , Rfl:    Cysteamine Bitartrate (PROCYSBI) 300 MG PACK, Use 1 each 3 (three) times daily Use as instructed., Disp: , Rfl:    diclofenac Sodium (VOLTAREN) 1 % GEL, Apply 2 g topically 4 (four) times daily. (Patient not taking: Reported on 04/10/2022), Disp: , Rfl:    diclofenac Sodium (VOLTAREN) 1 % GEL, Apply 2 g topically 4 (four) times daily., Disp: , Rfl:    doxepin (SINEQUAN) 50 MG capsule, Take 150 mg by mouth at bedtime. (Patient not taking: Reported on 04/10/2022), Disp: , Rfl:    doxepin (SINEQUAN) 50 MG capsule, Take by mouth., Disp: , Rfl:    DULoxetine (CYMBALTA) 60 MG  capsule, Take 60 mg by mouth daily. (Patient not taking: Reported on 04/10/2022), Disp: , Rfl:    DULoxetine (CYMBALTA) 60 MG capsule, Take 1 tablet by mouth daily., Disp: , Rfl:    ferrous sulfate 325 (65 FE) MG tablet, Take 325 mg by mouth daily with breakfast., Disp: , Rfl:    fluticasone-salmeterol (ADVAIR) 500-50 MCG/ACT AEPB, Inhale 1 puff into the lungs every 12 (twelve) hours., Disp: , Rfl:    glucose blood (PRECISION QID TEST) test strip, Use 1 each (1 strip total) once daily Use as instructed., Disp: , Rfl:    HYDROcodone-acetaminophen (NORCO/VICODIN) 5-325 MG tablet, Take 1 tablet by mouth every 4 (four) hours as needed for pain.,  Disp: , Rfl:    ketoconazole (NIZORAL) 2 % cream, ketoconazole 2 % topical cream  APPLY TWICE DAILY TO RASH ON FACE AND GROIN, Disp: , Rfl:    lisinopril (ZESTRIL) 40 MG tablet, Take 40 mg by mouth daily., Disp: , Rfl:    losartan-hydrochlorothiazide (HYZAAR) 100-25 MG tablet, Take 1 tablet by mouth daily., Disp: , Rfl:    metFORMIN (GLUCOPHAGE) 500 MG tablet, Take 500-1,000 mg by mouth 2 (two) times daily with a meal. (Patient not taking: Reported on 04/10/2022), Disp: , Rfl:    metFORMIN (GLUCOPHAGE) 500 MG tablet, Take by mouth., Disp: , Rfl:    methadone (DOLOPHINE) 5 MG tablet, Take 2.5-5 mg by mouth 2 (two) times daily. Take 2.5 mg (one-half tablet) twice daily for one week then increase to 5 mg (one tablet) twice daily, thereafter, Disp: , Rfl:    montelukast (SINGULAIR) 10 MG tablet, Take 10 mg by mouth daily. (Patient not taking: Reported on 04/10/2022), Disp: , Rfl:    montelukast (SINGULAIR) 10 MG tablet, Take 1 tablet by mouth daily., Disp: , Rfl:    moxifloxacin (VIGAMOX) 0.5 % ophthalmic solution, moxifloxacin 0.5 % eye drops  PUT 1 DROP IN RIGHT EYE 4 TIMES A DAY FOR 7 DAYS OR AS DIRECTED, Disp: , Rfl:    Multiple Vitamins-Minerals (MULTIVITAMIN WITH MINERALS) tablet, Take 1 tablet by mouth daily., Disp: , Rfl:    naloxone (NARCAN) nasal spray 4 mg/0.1 mL, Place into the nose., Disp: , Rfl:    nitroGLYCERIN (NITROSTAT) 0.4 MG SL tablet, nitroglycerin 0.4 mg sublingual tablet  PLACE 1 TABLET UNDER THE TONGUE EVERY FOR CHEST PAIN, MAY TAKE UP TO 3 DOSES, Disp: , Rfl:    nystatin (MYCOSTATIN) 100000 UNIT/ML suspension, Take 2 mLs by mouth in the morning, at noon, in the evening, and at bedtime. (Patient not taking: Reported on 04/10/2022), Disp: , Rfl:    nystatin (MYCOSTATIN) 100000 UNIT/ML suspension, nystatin 100,000 unit/mL oral suspension  SWISH AND SWALLOW 5 MLS 4 (FOUR) TIMES DAILY., Disp: , Rfl:    omeprazole (PRILOSEC) 40 MG capsule, Take 40 mg by mouth daily. (Patient not  taking: Reported on 04/10/2022), Disp: , Rfl:    omeprazole (PRILOSEC) 40 MG capsule, Take 1 tablet by mouth daily., Disp: , Rfl:    oxyCODONE (OXY IR/ROXICODONE) 5 MG immediate release tablet, Take by mouth., Disp: , Rfl:    pimecrolimus (ELIDEL) 1 % cream, Apply 1 application topically 2 (two) times daily., Disp: , Rfl:    pimecrolimus (ELIDEL) 1 % cream, Elidel 1 % topical cream  APPLY TWICE DAILY TO AFFECTED AREA(S) FOR 1-2 WEEKS (Patient not taking: Reported on 04/10/2022), Disp: , Rfl:    polyethylene glycol (MIRALAX / GLYCOLAX) 17 g packet, Take 17 g by mouth 2 (two) times daily. (Patient  not taking: Reported on 04/10/2022), Disp: , Rfl:    polyethylene glycol powder (GLYCOLAX/MIRALAX) 17 GM/SCOOP powder, Take by mouth., Disp: , Rfl:    pregabalin (LYRICA) 75 MG capsule, Take 75 mg by mouth 3 (three) times daily., Disp: , Rfl:    Semaglutide,0.25 or 0.5MG /DOS, (OZEMPIC, 0.25 OR 0.5 MG/DOSE,) 2 MG/3ML SOPN, Inject into the skin., Disp: , Rfl:    senna-docusate (SENOKOT-S) 8.6-50 MG tablet, Take 1 tablet by mouth daily. (Patient not taking: Reported on 04/10/2022), Disp: , Rfl:    senna-docusate (SENOKOT-S) 8.6-50 MG tablet, Take 2 tablets by mouth daily., Disp: , Rfl:    spironolactone (ALDACTONE) 25 MG tablet, Take 12.5 mg by mouth daily., Disp: , Rfl:    SUMAtriptan (IMITREX) 50 MG tablet, Take 50 mg by mouth as directed., Disp: , Rfl:    tiZANidine (ZANAFLEX) 4 MG tablet, Take 4 mg by mouth 3 (three) times daily., Disp: , Rfl:    traMADol (ULTRAM) 50 MG tablet, Take 50 mg by mouth every 6 (six) hours as needed for pain., Disp: , Rfl:    vitamin B-12 (CYANOCOBALAMIN) 1000 MCG tablet, Take 1,000 mcg by mouth daily., Disp: , Rfl:   Past Medical History: No past medical history on file.  Tobacco Use: Social History   Tobacco Use  Smoking Status Former   Current packs/day: 0.00   Average packs/day: 0.3 packs/day for 8.0 years (2.0 ttl pk-yrs)   Types: Cigarettes   Start date: 03/16/1969    Quit date: 03/16/1977   Years since quitting: 45.6   Passive exposure: Past  Smokeless Tobacco Former    Labs: Investment banker, operational       Latest Ref Rng & Units 11/04/2020 11/05/2020  Labs for [facility]P Cardiac and Pulmonary Rehab  Hemoglobin A1c 4.8 - 5.6 % - 6.2   PH, Arterial 7.350 - 7.450 7.24  7.34   PCO2 arterial 32.0 - 48.0 mmHg 44  37   Bicarbonate 20.0 - 28.0 mmol/L 20.2  18.9  20.0   Acid-base deficit 0.0 - 2.0 mmol/L 6.9  8.2  5.2   O2 Saturation % 75.8  89.5  99.2     Details       Multiple values from one day are sorted in reverse-chronological order          Pulmonary Assessment Scores:   UCSD: Self-administered rating of dyspnea associated with activities of daily living (ADLs) 6-point scale (0 = "not at all" to 5 = "maximal or unable to do because of breathlessness")  Scoring Scores range from 0 to 120.  Minimally important difference is 5 units  CAT: CAT can identify the health impairment of COPD patients and is better correlated with disease progression.  CAT has a scoring range of zero to 40. The CAT score is classified into four groups of low (less than 10), medium (10 - 20), high (21-30) and very high (31-40) based on the impact level of disease on health status. A CAT score over 10 suggests significant symptoms.  A worsening CAT score could be explained by an exacerbation, poor medication adherence, poor inhaler technique, or progression of COPD or comorbid conditions.  CAT MCID is 2 points  mMRC: mMRC (Modified Medical Research Council) Dyspnea Scale is used to assess the degree of baseline functional disability in patients of respiratory disease due to dyspnea. No minimal important difference is established. A decrease in score of 1 point or greater is considered a positive change.   Pulmonary Function Assessment:   Exercise Target  Goals: Exercise Program Goal: Individual exercise prescription set using results from initial 6 min walk test and THRR while  considering  patient's activity barriers and safety.   Exercise Prescription Goal: Initial exercise prescription builds to 30-45 minutes a day of aerobic activity, 2-3 days per week.  Home exercise guidelines will be given to patient during program as part of exercise prescription that the participant will acknowledge.  Education: Aerobic Exercise: - Group verbal and visual presentation on the components of exercise prescription. Introduces F.I.T.T principle from ACSM for exercise prescriptions.  Reviews F.I.T.T. principles of aerobic exercise including progression. Written material given at graduation. Flowsheet Row Pulmonary Rehab from 06/15/2022 in [facility]  Education need identified 04/12/22  Date 05/11/22  Educator [facility]  Instruction Review Code 1- Verbalizes Understanding       Education: Resistance Exercise: - Group verbal and visual presentation on the components of exercise prescription. Introduces F.I.T.T principle from ACSM for exercise prescriptions  Reviews F.I.T.T. principles of resistance exercise including progression. Written material given at graduation. Flowsheet Row Pulmonary Rehab from 06/15/2022 in [facility]  Date 05/18/22  Educator NT  Instruction Review Code 1- Verbalizes Understanding        Education: Exercise & Equipment Safety: - Individual verbal instruction and demonstration of equipment use and safety with use of the equipment. Flowsheet Row Pulmonary Rehab from 06/15/2022 in [facility] Pulmonary Re[facility]  Date 04/10/22  Educator [facility]  Instruction Review Code 1- Verbalizes Understanding       Education: Exercise Physiology & General Exercise Guidelines: - Group verbal and written instruction with models to review the exercise physiology of the cardiovascular system and associated critical values. Provides general exercise guidelines with specific guidelines to those with heart or lung disease.    Education:  Flexibility, Balance, Mind/Body Relaxation: - Group verbal and visual presentation with interactive activity on the components of exercise prescription. Introduces F.I.T.T principle from ACSM for exercise prescriptions. Reviews F.I.T.T. principles of flexibility and balance exercise training including progression. Also discusses the mind body connection.  Reviews various relaxation techniques to help reduce and manage stress (i.e. Deep breathing, progressive muscle relaxation, and visualization). Balance handout provided to take home. Written material given at graduation. Flowsheet Row Pulmonary Rehab from 06/15/2022 in [facility]  Date 05/18/22  Educator NT  Instruction Review Code 1- Verbalizes Understanding       Activity Barriers & Risk Stratification:   6 Minute Walk:  Oxygen Initial Assessment:  Oxygen Initial Assessment - 06/06/22 0803       Home Oxygen   Home Oxygen Device Home Concentrator    Sleep Oxygen Prescription Continuous;CPAP   new sleep study completed, waiting on results   Home Exercise Oxygen Prescription Continuous   prn   Liters per minute 2    Home Resting Oxygen Prescription Continuous   prn   Liters per minute 2    Compliance with Home Oxygen Use Yes   only prn     Program Oxygen Prescription   Program Oxygen Prescription None   Can use O2 prn     Intervention   Short Term Goals To learn and exhibit compliance with exercise, home and travel O2 prescription;To learn and understand importance of monitoring SPO2 with pulse oximeter and demonstrate accurate use of the pulse oximeter.;To learn and understand importance of maintaining oxygen saturations>88%;To learn and demonstrate proper pursed lip breathing techniques or other breathing techniques. ;To learn and demonstrate proper use of  respiratory medications    Long  Term Goals Verbalizes importance of monitoring SPO2 with pulse oximeter and return demonstration;Exhibits proper breathing  techniques, such as pursed lip breathing or other method taught during program session;Demonstrates proper use of MDI's;Compliance with respiratory medication;Maintenance of O2 saturations>88%;Exhibits compliance with exercise, home  and travel O2 prescription             Oxygen Re-Evaluation:  Oxygen Re-Evaluation     Row Name 04/25/22 0840 05/23/22 0756 06/06/22 0803 07/06/22 0804       Program Oxygen Prescription   Program Oxygen Prescription None None -- None      Home Oxygen   Home Oxygen Device Home Concentrator Home Concentrator -- Home Concentrator    Sleep Oxygen Prescription Continuous;CPAP Continuous;CPAP -- Continuous;CPAP    Liters per minute 0 0 -- 2    Home Exercise Oxygen Prescription -- Continuous -- None    Liters per minute 2 2 -- --    Home Resting Oxygen Prescription Continuous Continuous -- None    Liters per minute 2 2 -- --    Compliance with Home Oxygen Use -- No -- Yes  except CPAP, waiting for new mask      Goals/Expected Outcomes   Short Term Goals To learn and demonstrate proper pursed lip breathing techniques or other breathing techniques.  To learn and exhibit compliance with exercise, home and travel O2 prescription;To learn and understand importance of monitoring SPO2 with pulse oximeter and demonstrate accurate use of the pulse oximeter.;To learn and understand importance of maintaining oxygen saturations>88%;To learn and demonstrate proper pursed lip breathing techniques or other breathing techniques. ;To learn and demonstrate proper use of respiratory medications -- To learn and exhibit compliance with exercise, home and travel O2 prescription;To learn and understand importance of monitoring SPO2 with pulse oximeter and demonstrate accurate use of the pulse oximeter.;To learn and understand importance of maintaining oxygen saturations>88%;To learn and demonstrate proper pursed lip breathing techniques or other breathing techniques. ;To learn and  demonstrate proper use of respiratory medications    Long  Term Goals -- Verbalizes importance of monitoring SPO2 with pulse oximeter and return demonstration;Exhibits proper breathing techniques, such as pursed lip breathing or other method taught during program session;Demonstrates proper use of MDI's;Compliance with respiratory medication;Maintenance of O2 saturations>88%;Exhibits compliance with exercise, home  and travel O2 prescription -- Verbalizes importance of monitoring SPO2 with pulse oximeter and return demonstration;Exhibits proper breathing techniques, such as pursed lip breathing or other method taught during program session;Demonstrates proper use of MDI's;Compliance with respiratory medication;Maintenance of O2 saturations>88%;Exhibits compliance with exercise, home  and travel O2 prescription    Comments Reviewed PLB technique with pt.  Talked about how it works and it's importance in maintaining their exercise saturations. Ethan Frye is scheduled for a sleep study as his equipment has not been working well for him and is out of date. He tried to use his CPAP but it was putting too much pressure on his teeth and nose.  He has been wearing his oxygen at home.  His saturations are doing pretty good overall.  Walking will still drop him on occassion.  He is doing well with his PLB. Ethan Frye continues to do well. He just had a completed a sleep study this past Sunday and is awaiting the results. He is supposed to be getting a new mask this will fit him better with his CPAP. His oxygen saturations at rehab have been staying above 88% on RA. He continues to check them  at home and uses oxygen as needed. Continues to use PLB. Ethan Frye has not been able to use his CPAP at night as his mask does not fit.  He is still waiting to hear back from doctor about it.  He is wearing oxygen at night.  He is not needing his oxygen during the day except on occassion when he feels short of breath and then he will use it to help recover.   He is good about using his PLB and inhalers.    Goals/Expected Outcomes Short: Become more profiecient at using PLB. Long: Become independent at using PLB. Short; Get sleep study done Long; Continue to use PLB Short: Get results from sleep study and follow doctor orders on CPAP Long: Continue to monitor O2 and use PLB long-term Shrot: Continue to use O2 as needed Long: conitnue to use PLB             Oxygen Discharge (Final Oxygen Re-Evaluation):  Oxygen Re-Evaluation - 07/06/22 0804       Program Oxygen Prescription   Program Oxygen Prescription None      Home Oxygen   Home Oxygen Device Home Concentrator    Sleep Oxygen Prescription Continuous;CPAP    Liters per minute 2    Home Exercise Oxygen Prescription None    Home Resting Oxygen Prescription None    Compliance with Home Oxygen Use Yes   except CPAP, waiting for new mask     Goals/Expected Outcomes   Short Term Goals To learn and exhibit compliance with exercise, home and travel O2 prescription;To learn and understand importance of monitoring SPO2 with pulse oximeter and demonstrate accurate use of the pulse oximeter.;To learn and understand importance of maintaining oxygen saturations>88%;To learn and demonstrate proper pursed lip breathing techniques or other breathing techniques. ;To learn and demonstrate proper use of respiratory medications    Long  Term Goals Verbalizes importance of monitoring SPO2 with pulse oximeter and return demonstration;Exhibits proper breathing techniques, such as pursed lip breathing or other method taught during program session;Demonstrates proper use of MDI's;Compliance with respiratory medication;Maintenance of O2 saturations>88%;Exhibits compliance with exercise, home  and travel O2 prescription    Comments Ethan Frye has not been able to use his CPAP at night as his mask does not fit.  He is still waiting to hear back from doctor about it.  He is wearing oxygen at night.  He is not needing his oxygen  during the day except on occassion when he feels short of breath and then he will use it to help recover.  He is good about using his PLB and inhalers.    Goals/Expected Outcomes Shrot: Continue to use O2 as needed Long: conitnue to use PLB             Initial Exercise Prescription:   Perform Capillary Blood Glucose checks as needed.  Exercise Prescription Changes:   Exercise Prescription Changes     Row Name 05/04/22 1400 05/18/22 1200 06/01/22 0800 06/01/22 1400 06/13/22 1500     Response to Exercise   Blood Pressure (Admit) 126/70 104/66 -- 142/70 124/70   Blood Pressure (Exercise) 140/76 -- -- 136/76 --   Blood Pressure (Exit) 130/74 112/62 -- 124/62 120/68   Heart Rate (Admit) 88 bpm 72 bpm -- 72 bpm 78 bpm   Heart Rate (Exercise) 87 bpm 93 bpm -- 104 bpm 92 bpm   Heart Rate (Exit) 85 bpm 84 bpm -- 85 bpm 81 bpm   Oxygen Saturation (Admit) 90 % 96 % --  94 % 95 %   Oxygen Saturation (Exercise) 92 % 91 % -- 90 % 93 %   Oxygen Saturation (Exit) 95 % 95 % -- 97 % 96 %   Rating of Perceived Exertion (Exercise) 15 16 -- 15 15   Perceived Dyspnea (Exercise) 3 3 -- 3 2   Symptoms none SOB -- SOB, dizziness SOB   Comments First two days of exercise -- -- -- --   Duration Progress to 30 minutes of  aerobic without signs/symptoms of physical distress Progress to 30 minutes of  aerobic without signs/symptoms of physical distress -- Progress to 30 minutes of  aerobic without signs/symptoms of physical distress Continue with 30 min of aerobic exercise without signs/symptoms of physical distress.   Intensity THRR unchanged THRR unchanged -- THRR unchanged THRR unchanged     Progression   Progression Continue to progress workloads to maintain intensity without signs/symptoms of physical distress. Continue to progress workloads to maintain intensity without signs/symptoms of physical distress. -- Continue to progress workloads to maintain intensity without signs/symptoms of physical  distress. Continue to progress workloads to maintain intensity without signs/symptoms of physical distress.   Average METs 1.86 1.95 -- 1.67 2     Resistance Training   Training Prescription Yes Yes -- Yes Yes   Weight 3 lb 3 lb -- 3 lb 3 lb   Reps 10-15 10-15 -- 10-15 10-15     Interval Training   Interval Training No No -- No No     Oxygen   Oxygen -- --  prn -- -- --     Recumbant Bike   Level 1 -- -- -- --   Watts 10 -- -- -- --   Minutes 15 -- -- -- --   METs 2.32 -- -- -- --     NuStep   Level 1 1 -- 1 1   Minutes 30 30 -- 15 30   METs 1.8 2 -- -- 2.9     Biostep-RELP   Level -- 1 -- 2 2   Minutes -- 15 -- 30 30   METs -- 2 -- 2 2     Track   Laps -- 2  trying out to start -- 2  dizzy --   Minutes -- 15 -- 15 --   METs -- 1 -- 1 --     Home Exercise Plan   Plans to continue exercise at -- -- Home (comment)  walking,  recumbent bike Home (comment)  walking,  recumbent bike Home (comment)  walking,  recumbent bike   Frequency -- -- Add 3 additional days to program exercise sessions. Add 3 additional days to program exercise sessions. Add 3 additional days to program exercise sessions.   Initial Home Exercises Provided -- -- 06/01/22 06/01/22 06/01/22     Oxygen   Maintain Oxygen Saturation 88% or higher 88% or higher -- 88% or higher 88% or higher    Row Name 06/28/22 0700 07/13/22 1300 07/26/22 0900 08/10/22 1400 08/24/22 1000     Response to Exercise   Blood Pressure (Admit) 126/62 122/76 116/78 118/78 118/70   Blood Pressure (Exit) 130/80 118/78 114/70 110/74 116/68   Heart Rate (Admit) 71 bpm 93 bpm 75 bpm 73 bpm 81 bpm   Heart Rate (Exercise) 87 bpm 84 bpm 88 bpm 89 bpm 90 bpm   Heart Rate (Exit) 83 bpm 79 bpm 73 bpm 73 bpm 84 bpm   Oxygen Saturation (Admit) 96 % 92 % 95 %  97 % 91 %   Oxygen Saturation (Exercise) 93 % 94 % 92 % 92 % 96 %   Oxygen Saturation (Exit) 95 % 97 % 97 % 97 % 98 %   Rating of Perceived Exertion (Exercise) 15 13 12 14 12     Perceived Dyspnea (Exercise) 2 2 2 2 2    Symptoms SOB SOB SOB SOB SOB   Duration Continue with 30 min of aerobic exercise without signs/symptoms of physical distress. Continue with 30 min of aerobic exercise without signs/symptoms of physical distress. Continue with 30 min of aerobic exercise without signs/symptoms of physical distress. Continue with 30 min of aerobic exercise without signs/symptoms of physical distress. Continue with 30 min of aerobic exercise without signs/symptoms of physical distress.   Intensity THRR unchanged THRR unchanged THRR unchanged THRR unchanged THRR unchanged     Progression   Progression Continue to progress workloads to maintain intensity without signs/symptoms of physical distress. Continue to progress workloads to maintain intensity without signs/symptoms of physical distress. Continue to progress workloads to maintain intensity without signs/symptoms of physical distress. Continue to progress workloads to maintain intensity without signs/symptoms of physical distress. Continue to progress workloads to maintain intensity without signs/symptoms of physical distress.   Average METs 2.18 2.35 2.18 2.25 2     Resistance Training   Training Prescription Yes Yes Yes Yes Yes   Weight 3 lb 3 lb 3 lb 3 lb 3 lb   Reps 10-15 10-15 10-15 10-15 10-15     Interval Training   Interval Training No No No No No     NuStep   Level 3 2 3 2  --   Minutes 30 30 30 30  --   METs 2.5 2.7 2.4 2.6 --     Biostep-RELP   Level 2 2 2 2 2    Minutes 30 30 30 30 30    METs 2 2 2 2 2      Home Exercise Plan   Plans to continue exercise at Home (comment)  walking,  recumbent bike Home (comment)  walking,  recumbent bike Home (comment)  walking,  recumbent bike Home (comment)  walking,  recumbent bike Home (comment)  walking,  recumbent bike   Frequency Add 3 additional days to program exercise sessions. Add 3 additional days to program exercise sessions. Add 3 additional days to program  exercise sessions. Add 3 additional days to program exercise sessions. Add 3 additional days to program exercise sessions.   Initial Home Exercises Provided 06/01/22 06/01/22 06/01/22 06/01/22 06/01/22     Oxygen   Maintain Oxygen Saturation 88% or higher 88% or higher 88% or higher 88% or higher 88% or higher    Row Name 09/07/22 0900 09/19/22 1400 10/02/22 0900         Response to Exercise   Blood Pressure (Admit) 128/72 106/70 118/66     Blood Pressure (Exit) 132/80 96/60 126/70     Heart Rate (Admit) 85 bpm 81 bpm 74 bpm     Heart Rate (Exercise) 86 bpm 80 bpm 86 bpm     Heart Rate (Exit) 84 bpm 76 bpm 73 bpm     Oxygen Saturation (Admit) 94 % 94 % 93 %     Oxygen Saturation (Exercise) 93 % 92 % 85 %     Oxygen Saturation (Exit) 95 % 94 % 95 %     Rating of Perceived Exertion (Exercise) 12 12 14      Perceived Dyspnea (Exercise) 2 2 3  Symptoms SOB SOB SOB     Duration Continue with 30 min of aerobic exercise without signs/symptoms of physical distress. Continue with 30 min of aerobic exercise without signs/symptoms of physical distress. Continue with 30 min of aerobic exercise without signs/symptoms of physical distress.     Intensity THRR unchanged THRR unchanged THRR unchanged       Progression   Progression Continue to progress workloads to maintain intensity without signs/symptoms of physical distress. Continue to progress workloads to maintain intensity without signs/symptoms of physical distress. Continue to progress workloads to maintain intensity without signs/symptoms of physical distress.     Average METs 2.5 2.35 2.03       Resistance Training   Training Prescription Yes Yes Yes     Weight 3 lb 3 lb 3 lb     Reps 10-15 10-15 10-15       Interval Training   Interval Training No No No       NuStep   Level 2 3 2      Minutes 15 15 15      METs 2.4 2.7 2.1       Biostep-RELP   Level -- -- 2     Minutes -- -- 30     METs -- -- 2       Home Exercise Plan    Plans to continue exercise at Home (comment)  walking,  recumbent bike Home (comment)  walking,  recumbent bike Home (comment)  walking,  recumbent bike     Frequency Add 3 additional days to program exercise sessions. Add 3 additional days to program exercise sessions. Add 3 additional days to program exercise sessions.     Initial Home Exercises Provided 06/01/22 06/01/22 06/01/22       Oxygen   Maintain Oxygen Saturation 88% or higher 88% or higher 88% or higher              Exercise Comments:   Exercise Goals and Review:   Exercise Goals Re-Evaluation :  Exercise Goals Re-Evaluation     Row Name 04/25/22 0842 05/04/22 1433 05/18/22 1209 05/23/22 0749 06/01/22 0819     Exercise Goal Re-Evaluation   Exercise Goals Review Understanding of Exercise Prescription;Knowledge and understanding of Target Heart Rate Range (THRR);Able to understand and use Dyspnea scale;Able to understand and use rate of perceived exertion (RPE) scale Increase Physical Activity;Increase Strength and Stamina;Understanding of Exercise Prescription Increase Physical Activity;Increase Strength and Stamina;Understanding of Exercise Prescription Increase Physical Activity;Increase Strength and Stamina;Understanding of Exercise Prescription Increase Physical Activity;Increase Strength and Stamina;Understanding of Exercise Prescription;Able to understand and use rate of perceived exertion (RPE) scale;Able to understand and use Dyspnea scale;Able to check pulse independently;Knowledge and understanding of Target Heart Rate Range (THRR)   Comments Reviewed RPE scale, THR and program prescription with pt today.  Pt voiced understanding and was given a copy of goals to take home. Kolsten is off to a good start in the program. He had an overall average MET level of 1.86 METs during his first two days of rehab. He also was able to tolerate 30 minutes on the T4 nustep at level 1. He tried to use the recumbent bike but had to  switch back to the T4 nustep due to discomfort. We will continue to monitor his progress in the program. Ethan Frye missed a couple sessions last review as he out town. He has been staying on the T4 Nustep as he is limited on fitting on machines. He also is limited by his  breathing when walking. He tried out walking a couple laps on the track, we hope to that improve over time. His oxygen has been stayinh above 88%. We will continue to monitor. Ethan Frye is doing well in rehab.  He has been using his bike/seated elliptical at home.  He will usually go for about 15-20 min.  We talked about continuing to build up his time to 30 min.  He is trying to get there.  He does feel like his stamina is starting to get better. Reviewed home exercise with pt today.  Pt plans to walk and use recumbent bike at home for exercise.  Reviewed THR, pulse, RPE, sign and symptoms, pulse oximetery and when to call 911 or MD.  Also discussed weather considerations and indoor options.  Pt voiced understanding.   Expected Outcomes Short: Use RPE daily to regulate intensity. Long: Follow program prescription in THR. Short: Continue to follow current exercise prescription. Long: Continue to improve strength and stamina. Short: Slowly add in more laps on track Long: Continue to increase overall MET level and stamina Short: Continue to add time in on bike at home Long: Conitnue to improve stamina Short: Start to add in exercise more at home Long: Continue to improve stamina    Row Name 06/01/22 1413 06/06/22 0757 06/13/22 1559 06/28/22 0737 07/06/22 0750     Exercise Goal Re-Evaluation   Exercise Goals Review Increase Physical Activity;Increase Strength and Stamina;Understanding of Exercise Prescription Increase Physical Activity;Increase Strength and Stamina;Understanding of Exercise Prescription Increase Physical Activity;Increase Strength and Stamina;Understanding of Exercise Prescription Increase Physical Activity;Increase Strength and  Stamina;Understanding of Exercise Prescription Increase Physical Activity;Increase Strength and Stamina;Understanding of Exercise Prescription   Comments Charlton is doing well in rehab. He has continued to try and walk the track but has been limited due to dizziness. He was able to improve to level 2 on the biostep and tolerated it for 30 minutes. He has continued to use 3 lb hand weights for resistance training as well. We will continue to monitor his progress in the program. Ethan Frye states he has been using his recumbent bike for exercise. He uses it for about 15 minutes at a time, we talked about slowly accumulating his time up, and taking breaks when needed to. He practices PLB when he needs to and checks his O2, he states his O2 stays above 88%.  He continues to check his HR. He is using light therapy to help with neuropathy 2x/day for 90 days. He has seen an improvement so far and is hoping it may help improve his blaance and walking. Ethan Frye continues to come to rehab, has missed a couple sessions due to transportation issues. He usually walks to and from rehab and stays on seated machines while at rehab. He has been working at level 2 on [employer] but continues to exercise at level 1 on the T4 Nustep. He would benefit from also increasing that to level 2. He is still limited by his SOB when exercising, with an average RPE of 15. We will continue to monitor. Ethan Frye is doing well in rehab, although he has only attended rehab twic since the last review. He increased his overall average MET level to 2.18 METs. He also improved to level 3 on the T4 nustep and continued to work at level 2 on the biostep. His O2 saturations have stayed above 93% during exercise since the last review as well. We will continue to monitor his progress in the program. Ethan Frye is doing  well in rehab. He is up to 15-10min on his recumbent bike at home.  His stamina is not getting too much better.  He feels he has a lot to get done, but not able to do  it all.  He is also doing PT and neurorehab for nerves in his legs.   Expected Outcomes Short: Continue to push for more laps on the track. Long: Continue to improve strength and stamina. Short: Slowly build up tolerance on recumbent bike, monitor HR and O2 during Long: Continue independent exercise at home at appropriate prescription Short: Increase to level 2 on the Nustep Long: Continue to increase overall MET level and stamina Short: Continue to progressively increase workloads. Long: Continue to increase strength and stamina. Short: Continue to rehab nerves Long: Conitnue to exercise independent    Row Name 07/13/22 1312 07/26/22 0915 08/10/22 1436 08/24/22 1009 09/07/22 0909     Exercise Goal Re-Evaluation   Exercise Goals Review Increase Physical Activity;Increase Strength and Stamina;Understanding of Exercise Prescription Increase Physical Activity;Increase Strength and Stamina;Understanding of Exercise Prescription Increase Physical Activity;Increase Strength and Stamina;Understanding of Exercise Prescription Increase Physical Activity;Increase Strength and Stamina;Understanding of Exercise Prescription Increase Physical Activity;Increase Strength and Stamina;Understanding of Exercise Prescription   Comments Ethan Frye is doing wellin rehab.  He does 30 min on NuStep or BioStep each day and is now on level 2 for the full time.  We will continue to monitor his progress. Ethan Frye is doing wellin rehab. He continues to do well with 30 min on the T4 nustep or biostep each day. He also improved back up to level 3 on the T4 nustep, while continuing to work at level 2 on the biostep. He also has stayed consistent with 3 lb hand weights for resistance training. We will continue to monitor his progress in the program. Ethan Frye is doing well in rehab. He continues to work for 30 minutes on the biostep and T4 nustep at level 2. He also continues to do well with 3 lb hand weights for resistance training. We will continue to monitor  his progress in the program. Ethan Frye continues to maintain his workloads. He was absent 3 sessions this review period,  Will encuorage increasing workloads as tolerated. Will continue to monitor exercise progression with goal of increased stamina and strength. Ethan Frye attended 1 of 4 sesions. He maintained his workloads. He continued to use 3 lb hand weights.   Will continue to encourage and monitor exercise  progression.   Expected Outcomes Short: Continue to increase workloads for longer periods of time Long: continue to improve stamina Short: Continue to progressively increase workloads for longer periods of time. Long: Continue to improve strength and stamina. Short: Try level 3 on the biostep, go back up to level 3 on the T4 nustep. Long: Continue to increase overall METs and stamina. STG Increase workloads as tolerated with goal of increased stamina and strength.  Try Biostep at level 3.  LTG Continued exercise progression during program and after discharge STG have Ethan Frye incresae workloads as tolerated. Suggest intervals of higher level ,back to lower level until he can manage the new level for 15 minutes. LTG: Continued exercise progression as tolerated during program and after discharge.    Row Name 09/19/22 1500 10/02/22 0934 10/17/22 1451         Exercise Goal Re-Evaluation   Exercise Goals Review Increase Physical Activity;Increase Strength and Stamina;Understanding of Exercise Prescription Increase Physical Activity;Increase Strength and Stamina;Understanding of Exercise Prescription Increase Physical Activity;Increase Strength and Stamina;Understanding  of Exercise Prescription     Comments Ethan Frye is doing well in rehab. He has only attended one session since the time of the last review. His average METs did decrease to 2.35 METs. He did however increase his level on the T4 nustep from 2 to 3. We will continue to monitor his progress in the program. Ethan Frye is doing well in the program. He continues to work hard on  the T4 nustep, and Biostep but still experiences SOB and is recovering from a wrist injury. He has returned his previous intensities on the Biostep and T4 nustep. We will continue to monitor his progress in the program. Ethan Frye has not attended the program during this review. We will reach out to him to determine his return date.     Expected Outcomes Short: Return to regular attendance in the program. Long: Continue exercise to improve strength and stamina. Short: Suggest increasing level on Biostep and T4 nustep to level 3. Long: Continue exercise to improve strength and stamina. Short: Return to the program. Long: Continue exercise to improve strength and stamina.              Discharge Exercise Prescription (Final Exercise Prescription Changes):  Exercise Prescription Changes - 10/02/22 0900       Response to Exercise   Blood Pressure (Admit) 118/66    Blood Pressure (Exit) 126/70    Heart Rate (Admit) 74 bpm    Heart Rate (Exercise) 86 bpm    Heart Rate (Exit) 73 bpm    Oxygen Saturation (Admit) 93 %    Oxygen Saturation (Exercise) 85 %    Oxygen Saturation (Exit) 95 %    Rating of Perceived Exertion (Exercise) 14    Perceived Dyspnea (Exercise) 3    Symptoms SOB    Duration Continue with 30 min of aerobic exercise without signs/symptoms of physical distress.    Intensity THRR unchanged      Progression   Progression Continue to progress workloads to maintain intensity without signs/symptoms of physical distress.    Average METs 2.03      Resistance Training   Training Prescription Yes    Weight 3 lb    Reps 10-15      Interval Training   Interval Training No      NuStep   Level 2    Minutes 15    METs 2.1      Biostep-RELP   Level 2    Minutes 30    METs 2      Home Exercise Plan   Plans to continue exercise at Home (comment)   walking,  recumbent bike   Frequency Add 3 additional days to program exercise sessions.    Initial Home Exercises Provided 06/01/22       Oxygen   Maintain Oxygen Saturation 88% or higher             Nutrition:  Target Goals: Understanding of nutrition guidelines, daily intake of sodium 1500mg , cholesterol 200mg , calories 30% from fat and 7% or less from saturated fats, daily to have 5 or more servings of fruits and vegetables.  Education: All About Nutrition: -Group instruction provided by verbal, written material, interactive activities, discussions, models, and posters to present general guidelines for heart healthy nutrition including fat, fiber, MyPlate, the role of sodium in heart healthy nutrition, utilization of the nutrition label, and utilization of this knowledge for meal planning. Follow up email sent as well. Written material given at graduation. Flowsheet Row Pulmonary Rehab  from 06/15/2022 in [facility] Cardiac and Pulmonary Rehab  Education need identified 04/12/22       Biometrics:    Nutrition Therapy Plan and Nutrition Goals:  Nutrition Therapy & Goals - 06/06/22 0825       Nutrition Therapy   RD appointment deferred Yes             Nutrition Assessments:  MEDIFICTS Score Key: ?70 Need to make dietary changes  40-70 Heart Healthy Diet ? 40 Therapeutic Level Cholesterol Diet  Flowsheet Row Pulmonary Rehab from 04/12/2022 in [facility] Cardiac and Pulmonary Rehab  Picture Your Plate Total Score on Admission 44      Picture Your Plate Scores: <16 Unhealthy dietary pattern with much room for improvement. 41-50 Dietary pattern unlikely to meet recommendations for good health and room for improvement. 51-60 More healthful dietary pattern, with some room for improvement.  >60 Healthy dietary pattern, although there may be some specific behaviors that could be improved.   Nutrition Goals Re-Evaluation:  Nutrition Goals Re-Evaluation     Row Name 05/23/22 0751 06/06/22 0811 07/06/22 0759         Goals   Nutrition Goal Work on mechanical eating -- Short: Continue to work on [employer] Long: Continue to eat pulmonary healthy based diet     Comment Ethan Frye says that he is not hungry often but is eating some.  We talked about getting in at least two meals a day.  We talked about using meal replacement bars/shakes to help.  We talked about making sure he gets in enough calories each day. Patient has deferred talking to a RD at this time. He is working on trying to eat several smaller meals/ day and eating enough calories and macronutrients to meet his needs. Ethan Frye is doing well in rehab.  He is eating small meals but feels that has become his new normal. He is trying to get in protein and varitey.     Expected Outcome Short: Try meal replacement options Long: conitnue to improve diet Short: Continue to work on [employer] Long: Continue to eat pulmonary healthy based diet Continue to work on eating around the clock.              Nutrition Goals Discharge (Final Nutrition Goals Re-Evaluation):  Nutrition Goals Re-Evaluation - 07/06/22 0759       Goals   Nutrition Goal Short: Continue to work on smaller/frequent meals Long: Continue to eat pulmonary healthy based diet    Comment Ethan Frye is doing well in rehab.  He is eating small meals but feels that has become his new normal. He is trying to get in protein and varitey.    Expected Outcome Continue to work on eating around the clock.             Psychosocial: Target Goals: Acknowledge presence or absence of significant depression and/or stress, maximize coping skills, provide positive support system. Participant is able to verbalize types and ability to use techniques and skills needed for reducing stress and depression.   Education: Stress, Anxiety, and Depression - Group verbal and visual presentation to define topics covered.  Reviews how body is impacted by stress, anxiety, and depression.  Also discusses healthy ways to reduce stress and to treat/manage anxiety and depression.  Written material given at  graduation. Flowsheet Row Pulmonary Rehab from 06/15/2022 in [facility] Cardiac and Pulmonary Rehab  Date 04/27/22  Educator [facility]  Instruction Review Code 1- Verbalizes Understanding  Education: Sleep Hygiene -Provides group verbal and written instruction about how sleep can affect your health.  Define sleep hygiene, discuss sleep cycles and impact of sleep habits. Review good sleep hygiene tips.    Initial Review & Psychosocial Screening:   Quality of Life Scores:  Scores of 19 and below usually indicate a poorer quality of life in these areas.  A difference of  2-3 points is a clinically meaningful difference.  A difference of 2-3 points in the total score of the Quality of Life Index has been associated with significant improvement in overall quality of life, self-image, physical symptoms, and general health in studies assessing change in quality of life.  PHQ-9: Review Flowsheet       05/11/2022 04/12/2022  Depression screen PHQ 2/9  Decreased Interest 1 2  Down, Depressed, Hopeless 1 1  PHQ - 2 Score 2 3  Altered sleeping 3 2  Tired, decreased energy 3 2  Change in appetite 3 2  Feeling bad or failure about yourself  1 0  Trouble concentrating 0 0  Moving slowly or fidgety/restless 0 1  Suicidal thoughts 0 0  PHQ-9 Score 12 10  Difficult doing work/chores Somewhat difficult Somewhat difficult    Details           Interpretation of Total Score  Total Score Depression Severity:  1-4 = Minimal depression, 5-9 = Mild depression, 10-14 = Moderate depression, 15-19 = Moderately severe depression, 20-27 = Severe depression   Psychosocial Evaluation and Intervention:   Psychosocial Re-Evaluation:  Psychosocial Re-Evaluation     Row Name 05/11/22 0809 06/06/22 0813 07/06/22 0752         Psychosocial Re-Evaluation   Current issues with Current Sleep Concerns Current Stress Concerns Current Stress Concerns     Comments Reviewed patient health questionnaire (PHQ-9) with  patient for follow up. Previously, patients score indicated signs/symptoms of depression.  Reviewed to see if patient is improving symptom wise while in program.  Score declined and patient states that it is because he hasnt been sleeping well and has little energy. Ethan Frye just completed a new sleep study this last Sunday to get a new CPAP, getting fit for a new mask. He is still waiting on the results. Wakes up from chronic pain at night which he following pain management. His biggest stressor is that his roof is leaking and may need a new one costing him a financial burden. Coming to rehab helps him get his mind off of things and hopes to continue to see improvement. Ethan Frye is doing well in rehab.  He is still getting frustruted with not being able to get every thing done.  He is going to pulm rehab and neuro rehab as well.  He is still taking his supplements. He is still waiting on his sleep study to be reviewed with him to get new mask. He is still in process of getting new roof put on and he is still paying for it as well.  He is getting in his exercise and hoping to feel better once it is all done.     Expected Outcomes Short: Continue to work toward an improvement in PHQ9 scores by attending [facility] regularly. Long: Continue to improve stress and depression coping skills by talking with staff and attending [facility] regularly and work toward a positive mental state. Short: Get results from sleep study Long: Continue to utilize exercise for stress management and positive attitude Short: Continue to try to hear back about sleep study  Long: Conitnue to improve stamina and exercise for mental boost     Interventions Encouraged to attend Pulmonary Rehabilitation for the exercise Encouraged to attend Pulmonary Rehabilitation for the exercise Encouraged to attend Pulmonary Rehabilitation for the exercise     Continue Psychosocial Services  Follow up required by staff Follow up required by staff Follow up required by  staff              Psychosocial Discharge (Final Psychosocial Re-Evaluation):  Psychosocial Re-Evaluation - 07/06/22 0752       Psychosocial Re-Evaluation   Current issues with Current Stress Concerns    Comments Ethan Frye is doing well in rehab.  He is still getting frustruted with not being able to get every thing done.  He is going to pulm rehab and neuro rehab as well.  He is still taking his supplements. He is still waiting on his sleep study to be reviewed with him to get new mask. He is still in process of getting new roof put on and he is still paying for it as well.  He is getting in his exercise and hoping to feel better once it is all done.    Expected Outcomes Short: Continue to try to hear back about sleep study Long: Conitnue to improve stamina and exercise for mental boost    Interventions Encouraged to attend Pulmonary Rehabilitation for the exercise    Continue Psychosocial Services  Follow up required by staff             Education: Education Goals: Education classes will be provided on a weekly basis, covering required topics. Participant will state understanding/return demonstration of topics presented.  Learning Barriers/Preferences:   General Pulmonary Education Topics:  Infection Prevention: - Provides verbal and written material to individual with discussion of infection control including proper hand washing and proper equipment cleaning during exercise session. Flowsheet Row Pulmonary Rehab from 06/15/2022 in [facility] Cardiac and Pulmonary Rehab  Date 04/10/22  Educator [facility]  Instruction Review Code 1- Verbalizes Understanding       Falls Prevention: - Provides verbal and written material to individual with discussion of falls prevention and safety. Flowsheet Row Pulmonary Rehab from 06/15/2022 in [facility] Cardiac and Pulmonary Rehab  Date 04/10/22  Educator [facility]  Instruction Review Code 1- Verbalizes Understanding       Chronic Lung Disease Review: - Group  verbal instruction with posters, models, PowerPoint presentations and videos,  to review new updates, new respiratory medications, new advancements in procedures and treatments. Providing information on websites and "800" numbers for continued self-education. Includes information about supplement oxygen, available portable oxygen systems, continuous and intermittent flow rates, oxygen safety, concentrators, and Medicare reimbursement for oxygen. Explanation of Pulmonary Drugs, including class, frequency, complications, importance of spacers, rinsing mouth after steroid MDI's, and proper cleaning methods for nebulizers. Review of basic lung anatomy and physiology related to function, structure, and complications of lung disease. Review of risk factors. Discussion about methods for diagnosing sleep apnea and types of masks and machines for OSA. Includes a review of the use of types of environmental controls: home humidity, furnaces, filters, dust mite/pet prevention, HEPA vacuums. Discussion about weather changes, air quality and the benefits of nasal washing. Instruction on Warning signs, infection symptoms, calling MD promptly, preventive modes, and value of vaccinations. Review of effective airway clearance, coughing and/or vibration techniques. Emphasizing that all should Create an Action Plan. Written material given at graduation. Flowsheet Row Pulmonary Rehab from 06/15/2022 in [facility] Cardiac and Pulmonary Rehab  Education need identified 04/12/22       AED/CPR: - Group verbal and written instruction with the use of models to demonstrate the basic use of the AED with the basic ABC's of resuscitation.    Anatomy and Cardiac Procedures: - Group verbal and visual presentation and models provide information about basic cardiac anatomy and function. Reviews the testing methods done to diagnose heart disease and the outcomes of the test results. Describes the treatment choices: Medical Management,  Angioplasty, or Coronary Bypass Surgery for treating various heart conditions including Myocardial Infarction, Angina, Valve Disease, and Cardiac Arrhythmias.  Written material given at graduation.   Medication Safety: - Group verbal and visual instruction to review commonly prescribed medications for heart and lung disease. Reviews the medication, class of the drug, and side effects. Includes the steps to properly store meds and maintain the prescription regimen.  Written material given at graduation. Flowsheet Row Pulmonary Rehab from 06/15/2022 in [facility] Cardiac and Pulmonary Rehab  Date 06/15/22  Educator [facility]  Instruction Review Code 1- Verbalizes Understanding       Other: -Provides group and verbal instruction on various topics (see comments)   Knowledge Questionnaire Score:    Core Components/Risk Factors/Patient Goals at Admission:   Education:Diabetes - Individual verbal and written instruction to review signs/symptoms of diabetes, desired ranges of glucose level fasting, after meals and with exercise. Acknowledge that pre and post exercise glucose checks will be done for 3 sessions at entry of program. Flowsheet Row Pulmonary Rehab from 06/15/2022 in [facility] Cardiac and Pulmonary Rehab  Date 04/10/22  Educator [facility]  Instruction Review Code 1- Verbalizes Understanding       Know Your Numbers and Heart Failure: - Group verbal and visual instruction to discuss disease risk factors for cardiac and pulmonary disease and treatment options.  Reviews associated critical values for Overweight/Obesity, Hypertension, Cholesterol, and Diabetes.  Discusses basics of heart failure: signs/symptoms and treatments.  Introduces Heart Failure Zone chart for action plan for heart failure.  Written material given at graduation.   Core Components/Risk Factors/Patient Goals Review:   Goals and Risk Factor Review     Row Name 05/23/22 0753 06/06/22 0805 07/06/22 0801         Core Components/Risk  Factors/Patient Goals Review   Personal Goals Review Weight Management/Obesity;Improve shortness of breath with ADL's;Increase knowledge of respiratory medications and ability to use respiratory devices properly.;Diabetes;Hypertension Weight Management/Obesity;Improve shortness of breath with ADL's;Increase knowledge of respiratory medications and ability to use respiratory devices properly.;Diabetes;Hypertension Weight Management/Obesity;Improve shortness of breath with ADL's;Increase knowledge of respiratory medications and ability to use respiratory devices properly.;Diabetes;Hypertension     Review Ethan Frye is doing well in rehab.  His weight is trending down and is down to 211 here and 205 at home.  He is pleased with his progress.  His blood pressures are doing well haning around 130s/80s.  His sugars have been around 130s as well.  He checks both daily at home.  His breathing is rough today.  Overall, it has been getting better. He is using his inhaler and nebulizer each day. Ethan Frye states he feels like he's doing a lot at home at the moment as he is participating in light therapy for his neuropathy in addition to his home exercise. He has good and bad breathing days and continues to monitor his O2 and using his oxygen prn. He is taking all medications OK. Continues to use his inhaler and nebulizer. He is now able to walk down here using his  walker. BP has been running nicely at home and rehab, ranging around 120/70s. He checks it daily. So far he has maintained his weight. Overall he has lost 20 lbs in 3 months from Ozempic and wants to be under 200 lb. Today he was around 210 lb. He checks his  blood sugar every morning which is ranging around 130 every day, where his MD wants it to be. Ethan Frye is doing well in rehab.  His weight goes up and down, but he thinks that it is trending down overall.  He is down 20 lb since all of this started.  His pressures are doing well and his sugars continue to do well.  He is good  about using his inhalers.  His breathing is still hanging around about the same, but his is also limited his pain too. He is doing well with using his PLB.     Expected Outcomes Short: Continue to use breathing treatments on bad breathing days Long: Conitnue to work on weight loss Short: Continue to work on weight loss and monitor BP/BS Long: Work toward weight goal under 200 lb Short: Continue to work on [employer] loss Long: Continue to monitor risk factors.              Core Components/Risk Factors/Patient Goals at Discharge (Final Review):   Goals and Risk Factor Review - 07/06/22 0801       Core Components/Risk Factors/Patient Goals Review   Personal Goals Review Weight Management/Obesity;Improve shortness of breath with ADL's;Increase knowledge of respiratory medications and ability to use respiratory devices properly.;Diabetes;Hypertension    Review Ethan Frye is doing well in rehab.  His weight goes up and down, but he thinks that it is trending down overall.  He is down 20 lb since all of this started.  His pressures are doing well and his sugars continue to do well.  He is good about using his inhalers.  His breathing is still hanging around about the same, but his is also limited his pain too. He is doing well with using his PLB.    Expected Outcomes Short: Continue to work on weight loss Long: Continue to monitor risk factors.             ITP Comments:  ITP Comments     Row Name 04/25/22 0840 05/03/22 4098 05/31/22 1349 06/28/22 0934 07/26/22 1124   ITP Comments First full day of exercise!  Patient was oriented to gym and equipment including functions, settings, policies, and procedures.  Patient's individual exercise prescription and treatment plan were reviewed.  All starting workloads were established based on the results of the 6 minute walk test done at initial orientation visit.  The plan for exercise progression was also introduced and progression will be customized based on  patient's performance and goals. 30 Day review completed. Medical Director ITP review done, changes made as directed, and signed approval by Medical Director.     new to program 30 day review completed. ITP sent to Dr. Jinny Sanders, Medical Director of  Pulmonary Rehab. Continue with ITP unless changes are made by physician. 30 Day review completed. Medical Director ITP review done, changes made as directed, and signed approval by Medical Director. 30 Day review completed. Medical Director ITP review done, changes made as directed, and signed approval by Medical Director.    Row Name 08/22/22 1458 09/20/22 1200 10/18/22 1110       ITP Comments 30 Day review completed. Medical Director ITP review done, changes made as  directed, and signed approval by Medical Director. 30 Day review completed. Medical Director ITP review done, changes made as directed, and signed approval by Medical Director. 30 Day review completed. Medical Director ITP review done, changes made as directed, and signed approval by Medical Director.              Comments:

## 2022-10-24 ENCOUNTER — Ambulatory Visit: Payer: Medicare Other

## 2022-10-26 ENCOUNTER — Ambulatory Visit: Payer: Medicare Other

## 2022-10-30 IMAGING — DX DG CHEST 1V PORT
1 series · 1 of 1 positions shown · non-contrast
Comparison: 11/04/2020 at [DATE] a.m.

CLINICAL DATA: Respiratory failure

EXAM:
PORTABLE CHEST 1 VIEW

[chest ap]
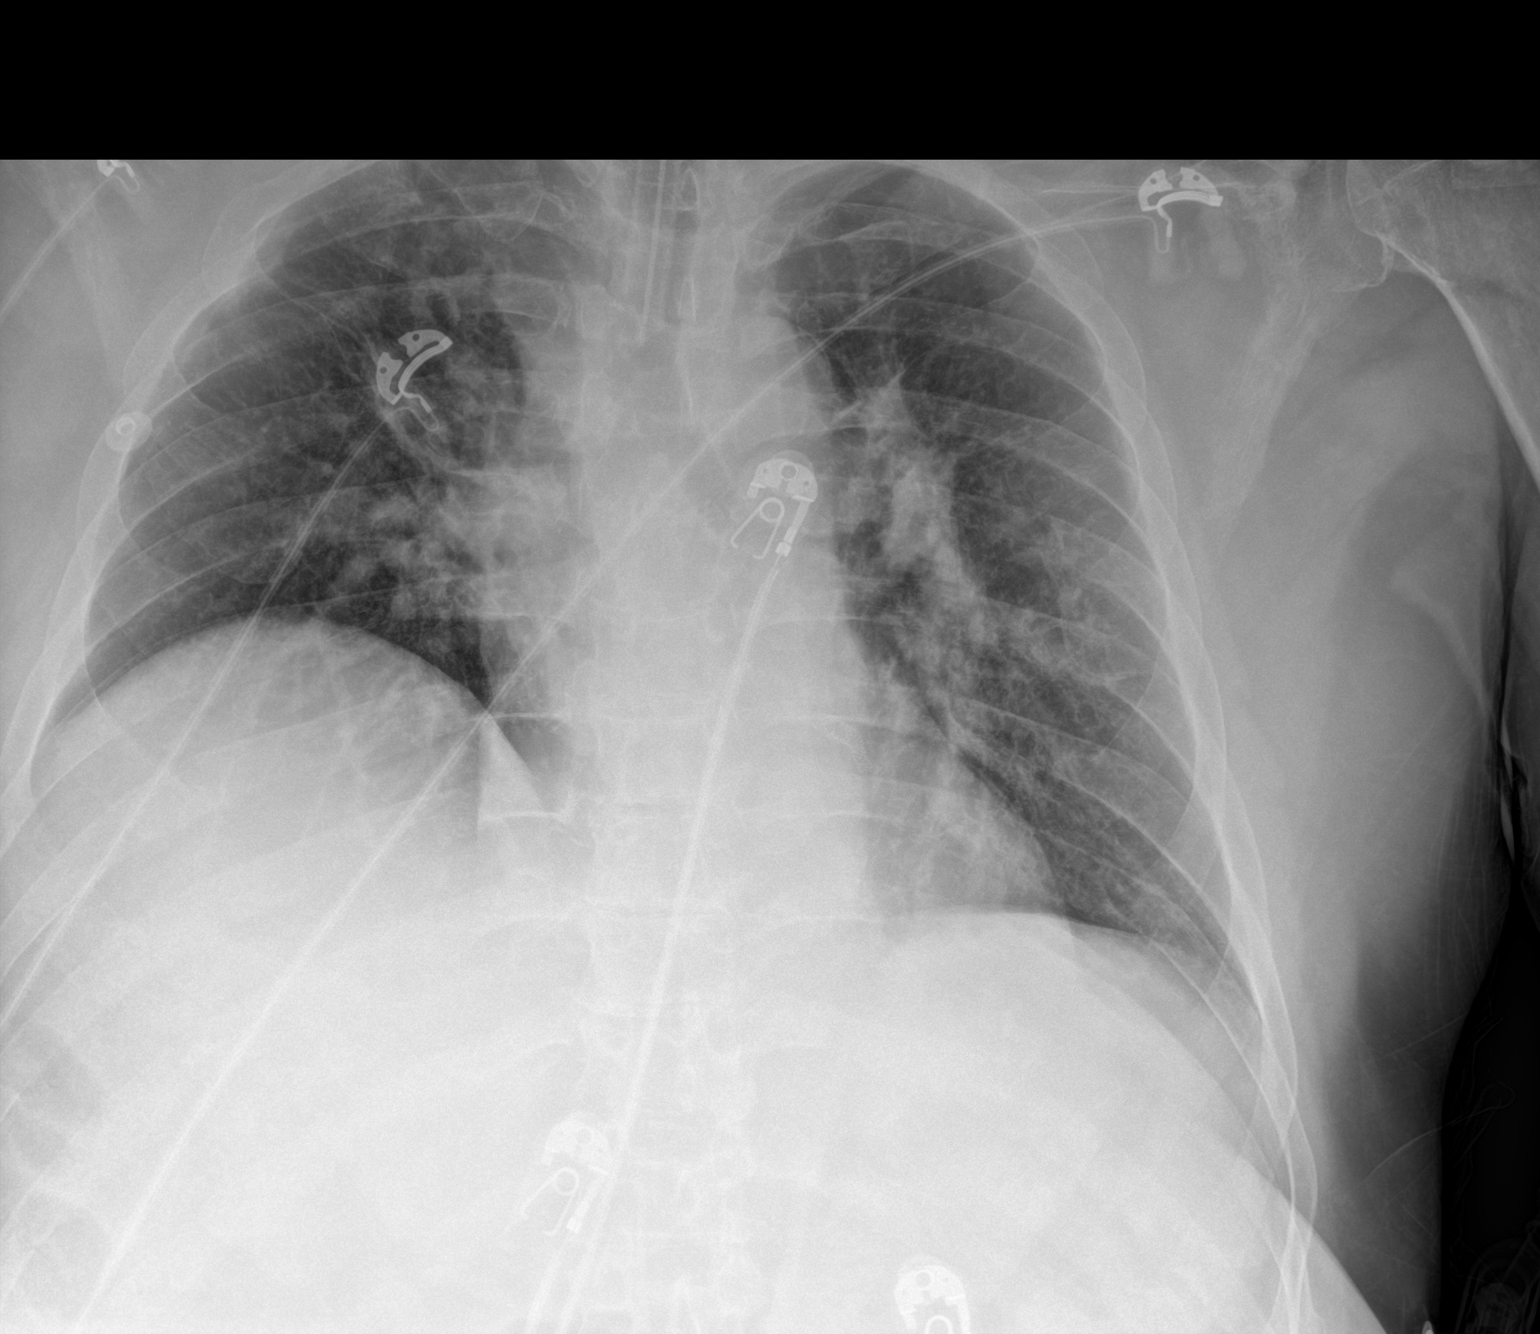

[1 of 1 positions shown; findings below may reference images not displayed]

FINDINGS: Endotracheal tube is in place 2.8 cm above the carina. Lung volumes
are slightly small with stable elevation of the right hemidiaphragm.
Right perihilar atelectasis or infiltrate is unchanged. Patchy
infiltrate has developed at the left lung baseq, possibly infectious
or inflammatory in nature no pneumothorax or pleural effusion.
Cardiac size within normal limits. No acute bone abnormality.
IMPRESSION: Endotracheal tube 2.8 cm above the carina.

Pulmonary hypoinflation.

Interval development of patchy infiltrate at the left lung base,
possibly infectious or inflammatory.

## 2022-10-30 IMAGING — DX DG CHEST 1V PORT
1 series · 1 of 1 positions shown · non-contrast
Comparison: None.

CLINICAL DATA: Drug overdose

EXAM:
PORTABLE CHEST 1 VIEW

[chest ap]
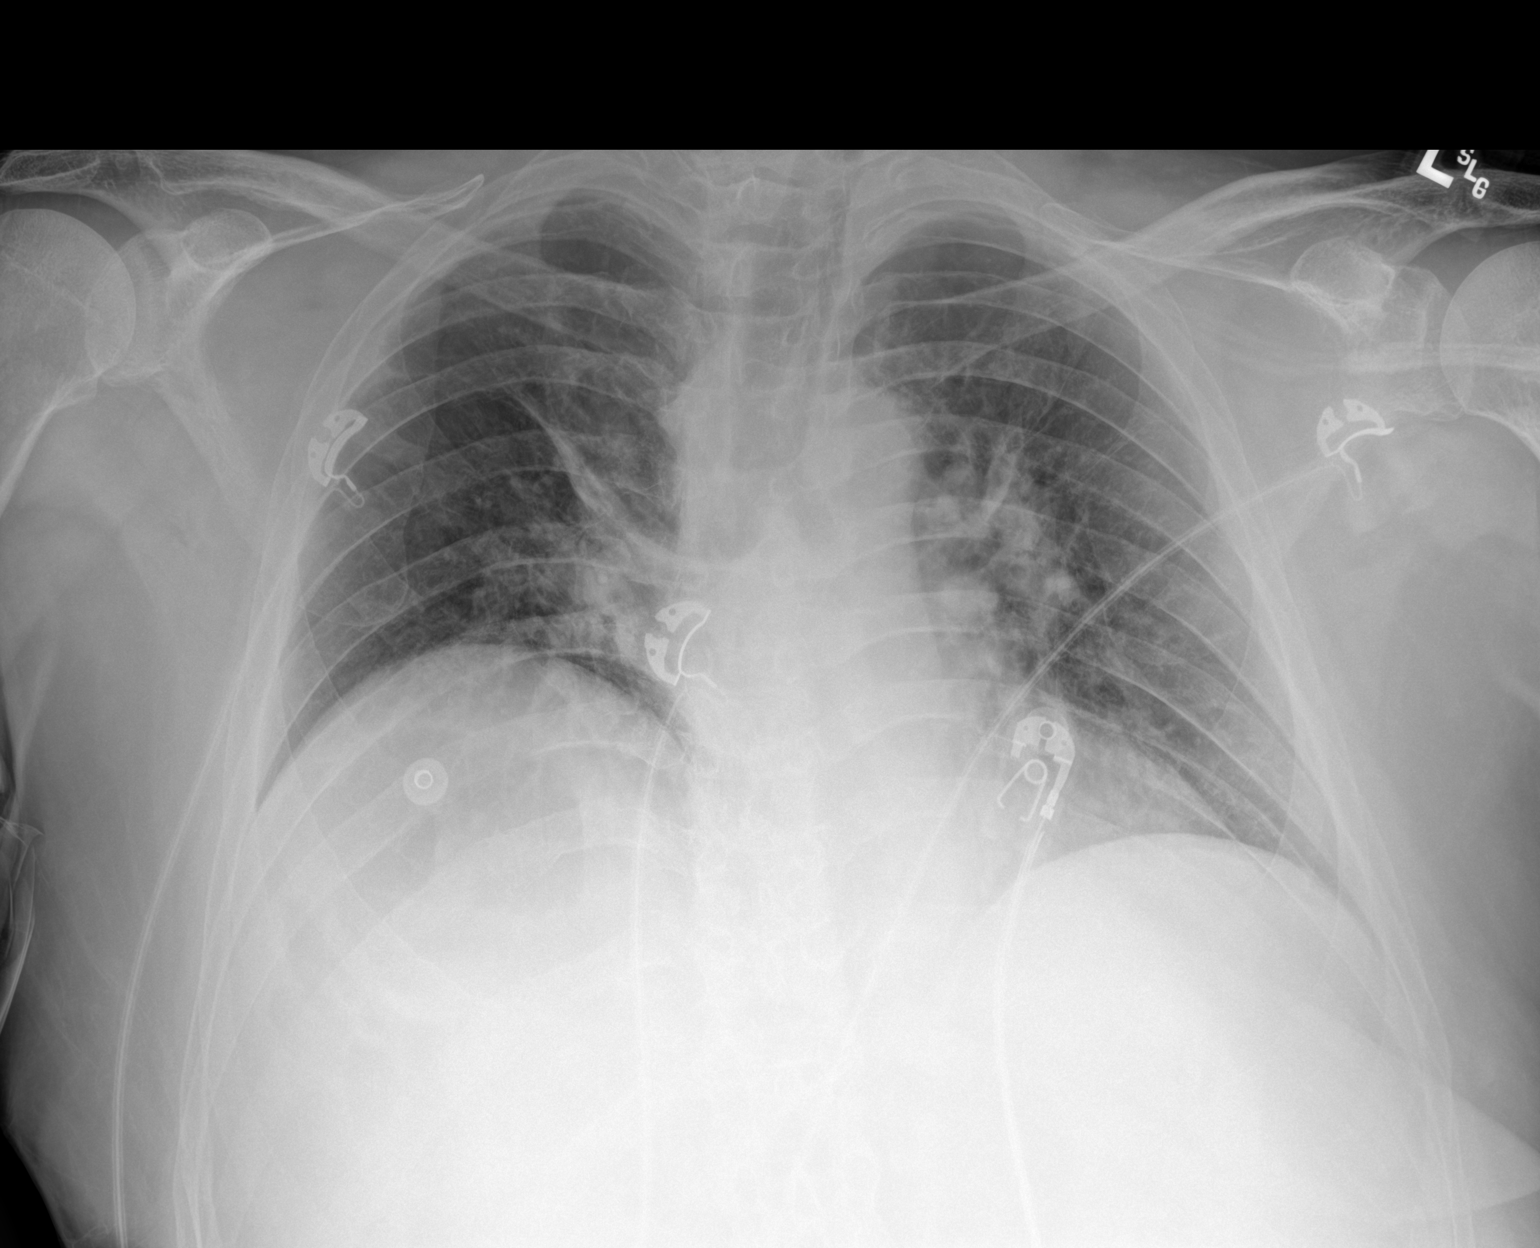

[1 of 1 positions shown; findings below may reference images not displayed]

FINDINGS: The cardiomediastinal silhouette is within normal limits.

Lung volumes are low, with asymmetric elevation of the right
hemidiaphragm. There are patchy perihilar opacities and linear
opacity projecting over the right mid lung. Otherwise, there is no
focal consolidation. There is no pleural effusion. There is no
pneumothorax.

There is no acute osseous abnormality.
IMPRESSION: 1. Low lung volumes with asymmetric elevation of the right
hemidiaphragm.
2. Linear opacity projecting over the right mid lung may reflect
atelectasis or scar.

## 2022-10-30 IMAGING — CT CT HEAD W/O CM
4 series · 15 of 47 positions shown, 17 images · non-contrast
Comparison: None.

CLINICAL DATA: Altered mental status

EXAM:
CT HEAD WITHOUT CONTRAST
TECHNIQUE: Contiguous axial images were obtained from the base of the skull
through the vertex without intravenous contrast.

[Series 2: head bone · axial · 0.44mm/px · z∈[+561,+577]mm · 2 of 78 slices shown]
[im 8/78  bone]
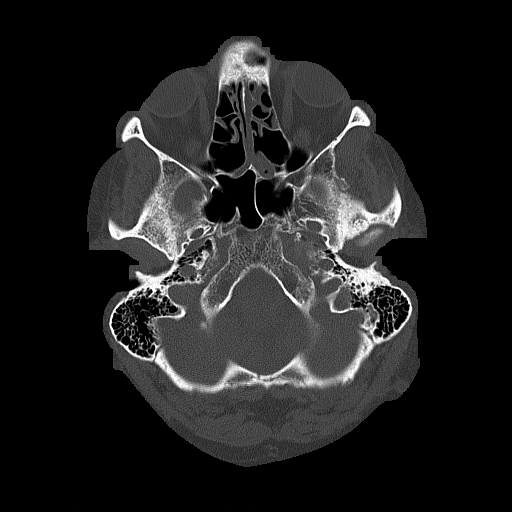
[im 16/78  bone]
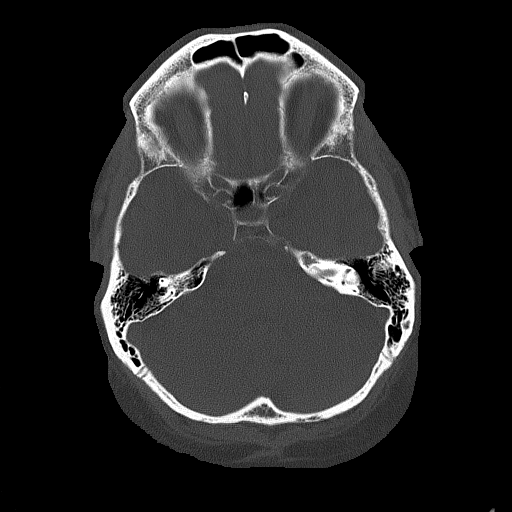

[Series 3: ax head wo · axial · 0.35mm/px · z∈[+563,+681]mm · 7 of 33 slices shown, 9 images]
[im 5/33  brain]
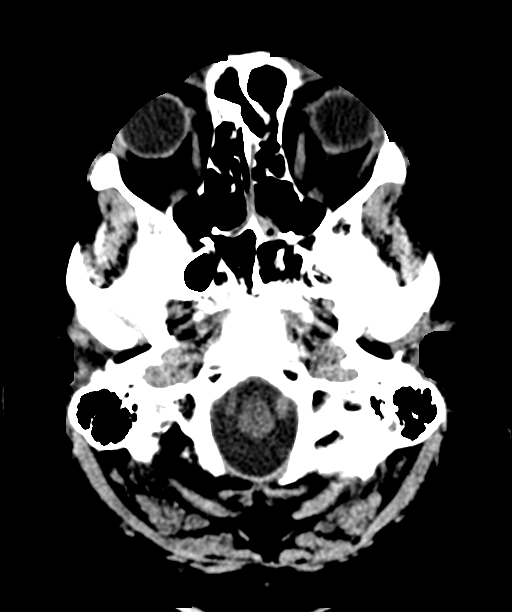
[im 5/33  bone]
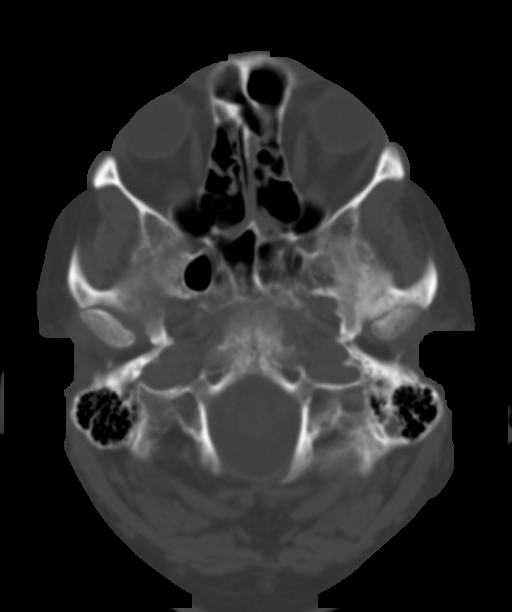
[im 9/33  brain]
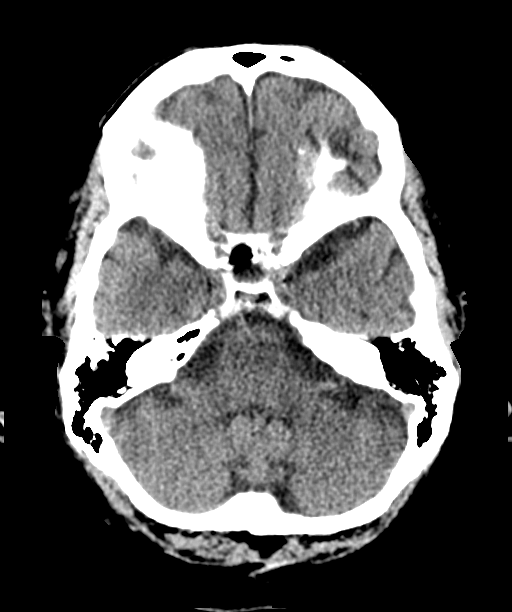
[im 13/33  brain]
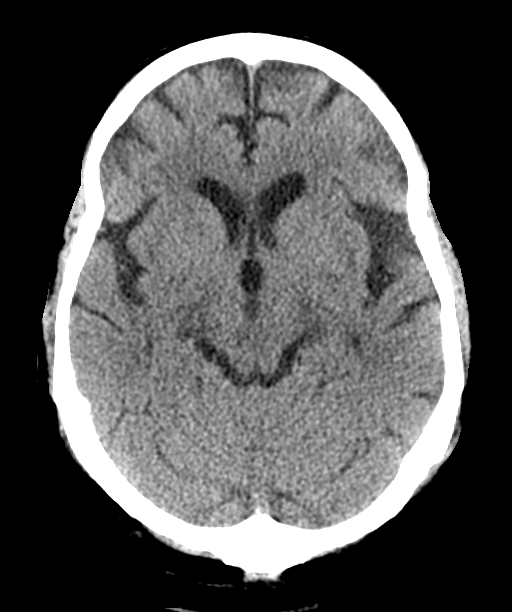
[im 17/33  brain]
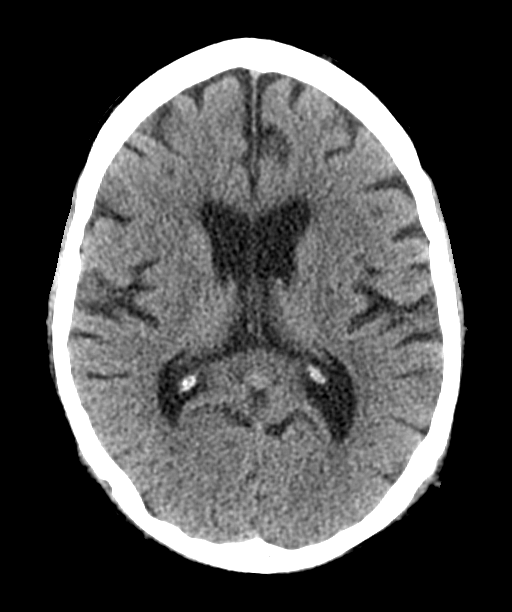
[im 21/33  brain]
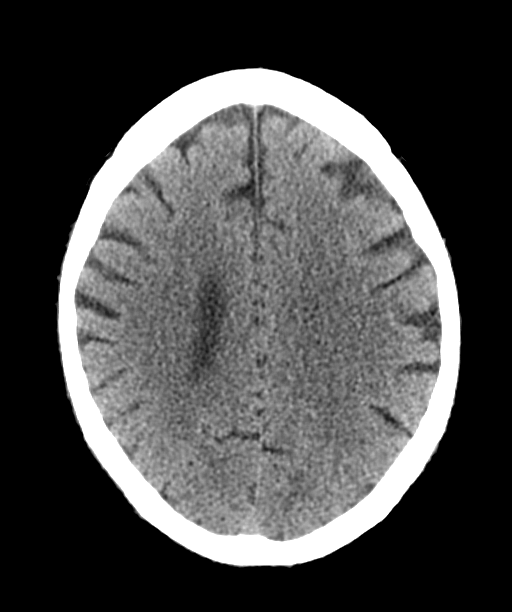
[im 21/33  bone]
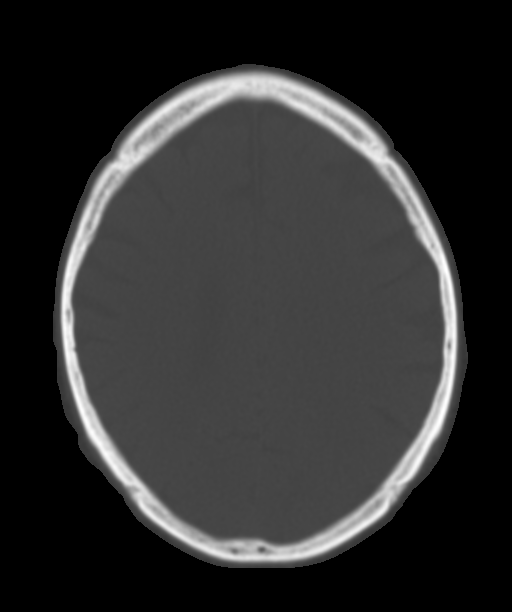
[im 25/33  brain]
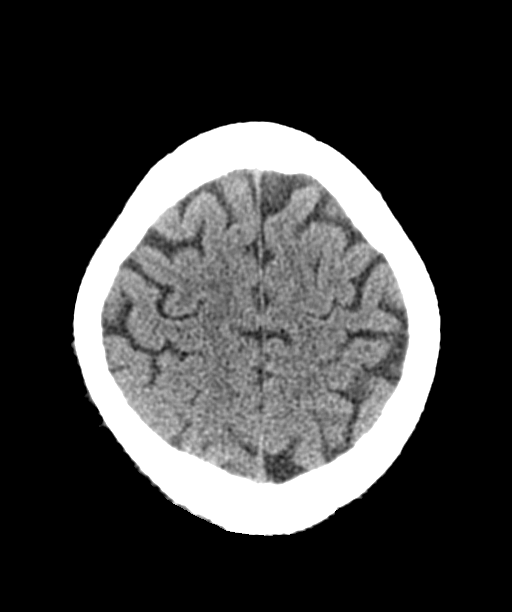
[im 29/33  brain]
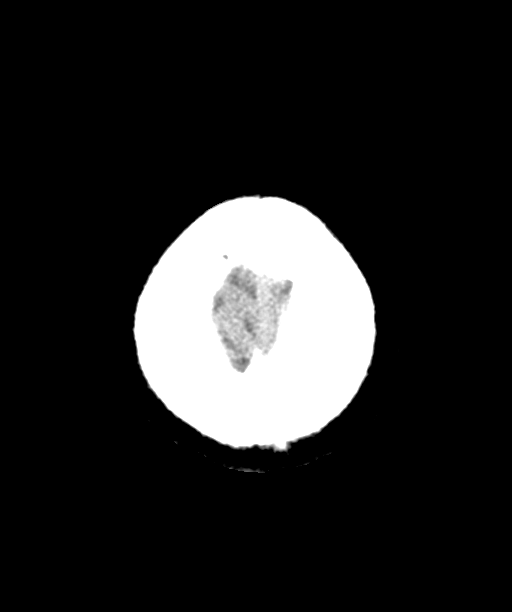

[Series 4: coronal soft tissue · coronal · 0.31mm/px · 3 of 66 slices shown]
[im 22/66  brain]
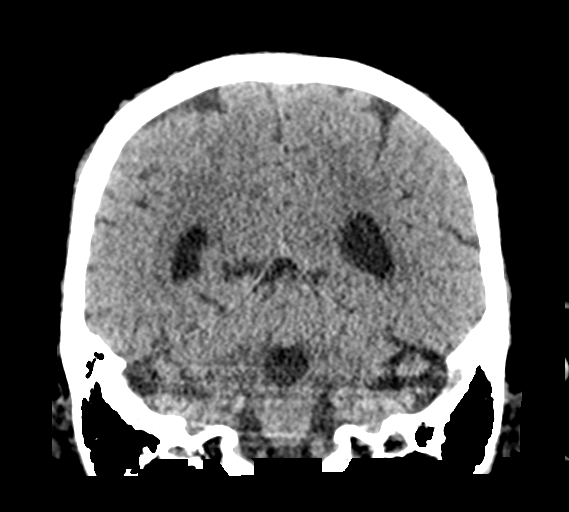
[im 29/66  brain]
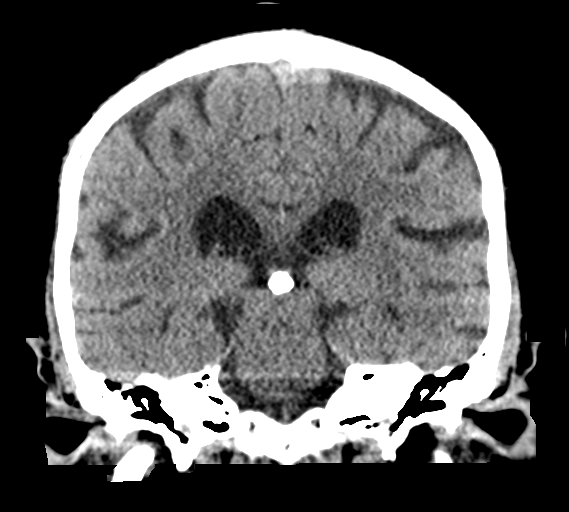
[im 37/66  brain]
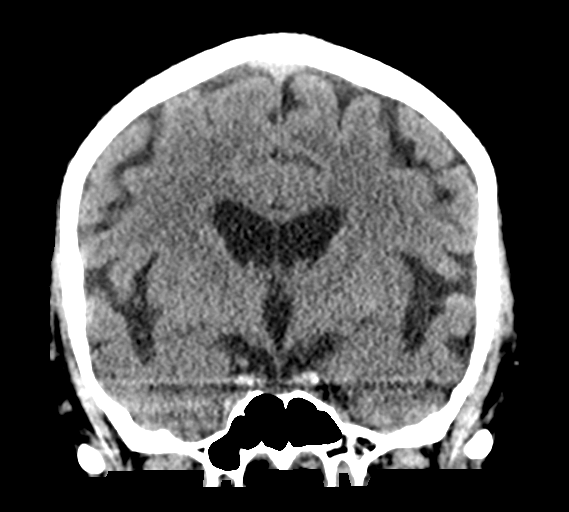

[Series 5: sagittal soft tissue · sagittal · 0.31mm/px · 3 of 53 slices shown]
[im 18/53  brain]
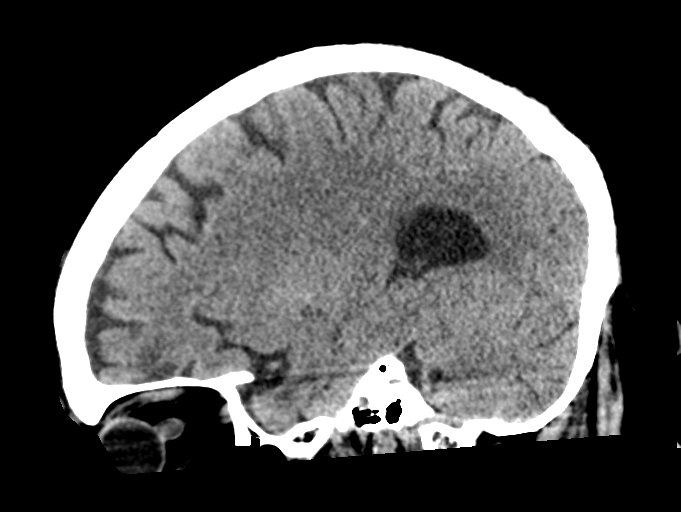
[im 27/53  brain]
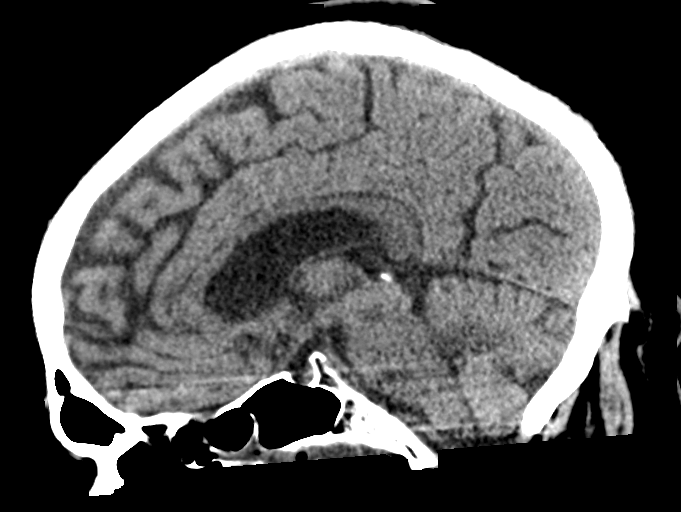
[im 35/53  brain]
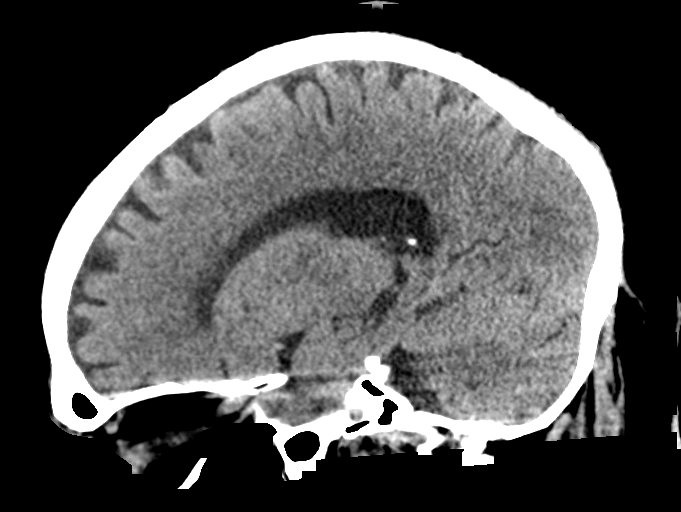

[15 of 47 positions shown; findings below may reference images not displayed]

FINDINGS: Brain: There is no evidence of acute intracranial hemorrhage,
extra-axial fluid collection, or acute infarct. Gray-white
differentiation is maintained.

There is mild parenchymal volume loss with commensurate enlargement
of the ventricular system. There is no mass lesion. There is no
midline shift.

Vascular: No hyperdense vessel or unexpected calcification.

Skull: Normal. Negative for fracture or focal lesion. Bilateral lens
implants are in place. There is dystrophic appearing calcification
along the superior aspect of the right globe which may reflect a
choroidal osteoma or sequela of prior trauma.

Sinuses/Orbits: There is layering debris in the nasal cavity and
nasopharynx possibly related to intubation.

Other: None.
IMPRESSION: No acute intracranial pathology.

## 2022-10-31 ENCOUNTER — Ambulatory Visit: Payer: Medicare Other

## 2022-11-02 ENCOUNTER — Ambulatory Visit: Payer: Medicare Other | Admitting: *Deleted

## 2022-11-07 ENCOUNTER — Telehealth: Payer: Self-pay | Admitting: *Deleted

## 2022-11-07 ENCOUNTER — Ambulatory Visit: Payer: Medicare Other | Admitting: *Deleted

## 2022-11-07 ENCOUNTER — Encounter: Payer: Self-pay | Admitting: *Deleted

## 2022-11-07 NOTE — Telephone Encounter (Signed)
Called to check on Ethan Frye. He has not been to rehab since 09/28/22. He has called to cancel several appointments, however has "no-showed" the last 3 visits. No answer when called today. Left message asking that he call us.

## 2022-11-08 NOTE — Telephone Encounter (Signed)
Al returned call yesterday and left message. Returned his call today, no answer at this time. I left detailed voicemail asking that if he did not reach me to speak with someone here in rehab to discuss if he was able to continue to program or wanted to discuss early discharge.

## 2022-11-09 ENCOUNTER — Ambulatory Visit: Payer: Medicare Other | Admitting: *Deleted

## 2022-11-09 ENCOUNTER — Encounter: Payer: Self-pay | Admitting: *Deleted

## 2022-11-09 ENCOUNTER — Telehealth: Payer: Self-pay | Admitting: *Deleted

## 2022-11-09 DIAGNOSIS — D869 Sarcoidosis, unspecified: Secondary | ICD-10-CM

## 2022-11-09 NOTE — Progress Notes (Signed)
Discharge Summary:  Ethan Frye  (DOB:  [birth year])  Pt discharged early from pulmonary rehab. He was unable to make it to appointments. He completed 26/36 sessions.     04/12/22 1141  6 Minute Walk  Phase Initial  Distance 840 feet  Walk Time 6 minutes  # of Rest Breaks 0  MPH 1.59  METS 1.86  RPE 17  Perceived Dyspnea  3  VO2 Peak 6.51  Symptoms Yes (comment)  Comments SOB, chronic hip pain  Resting HR 78 bpm  Resting BP 118/68  Resting Oxygen Saturation  94 %  Exercise Oxygen Saturation  during 6 min walk 89 %  Max Ex. HR 92 bpm  Max Ex. BP 146/74  2 Minute Post BP 136/72  Interval HR  Interval Heart Rate? Yes  1 Minute HR 86  2 Minute HR 91  3 Minute HR 90  4 Minute HR 91  5 Minute HR 92  6 Minute HR 92  2 Minute Post HR 78  Interval Oxygen  Interval Oxygen? Yes  Baseline Oxygen Saturation % 94 %  1 Minute Oxygen Saturation % 90 %  1 Minute Liters of Oxygen 0 L (Room Air)  2 Minute Oxygen Saturation % 90 %  2 Minute Liters of Oxygen 0 L  3 Minute Oxygen Saturation % 89 %  3 Minute Liters of Oxygen 0 L  4 Minute Oxygen Saturation % 89 %  4 Minute Liters of Oxygen 0 L  5 Minute Oxygen Saturation % 90 %  5 Minute Liters of Oxygen 0 L  6 Minute Oxygen Saturation % 90 %  6 Minute Liters of Oxygen 0 L  2 Minute Post Oxygen Saturation % 94 %  2 Minute Post Liters of Oxygen 0 L    Fabio Pierce, MA, [place], CCRP 04/12/2022 11:50 AM

## 2022-11-09 NOTE — Progress Notes (Signed)
Pulmonary Individual Treatment Plan  Patient Details  Name: Ethan Frye MRN: [id number] Date of Birth: [birth year] Referring Provider:   Flowsheet Row Pulmonary Rehab from 04/12/2022 in [facility] Cardiac and Pulmonary Rehab  Referring Provider Landry Dyke MD       Initial Encounter Date:  Flowsheet Row Pulmonary Rehab from 04/12/2022 in [facility] Cardiac and Pulmonary Rehab  Date 04/12/22       Visit Diagnosis: Sarcoidosis  Patient's Home Medications on Admission:  Current Outpatient Medications:    acidophilus (RISAQUAD) CAPS capsule, Take 1 capsule by mouth daily., Disp: , Rfl:    albuterol (PROVENTIL) (2.5 MG/3ML) 0.083% nebulizer solution, Take 2.5 mg by nebulization every 6 (six) hours as needed for wheezing or shortness of breath. (Patient not taking: Reported on 04/10/2022), Disp: , Rfl:    albuterol (VENTOLIN HFA) 108 (90 Base) MCG/ACT inhaler, Inhale 2 puffs into the lungs every 6 (six) hours as needed for wheezing or shortness of breath., Disp: , Rfl:    aspirin EC 81 MG tablet, Take 81 mg by mouth daily. Swallow whole., Disp: , Rfl:    aspirin EC 81 MG tablet, Take by mouth. (Patient not taking: Reported on 04/10/2022), Disp: , Rfl:    atorvastatin (LIPITOR) 20 MG tablet, Take 20 mg by mouth daily. (Patient not taking: Reported on 04/10/2022), Disp: , Rfl:    atorvastatin (LIPITOR) 40 MG tablet, Take by mouth., Disp: , Rfl:    azithromycin (ZITHROMAX) 500 MG tablet, Take 500 mg by mouth every Monday, Wednesday, and Friday. (Patient not taking: Reported on 04/10/2022), Disp: , Rfl:    Blood Glucose Monitoring Suppl (FIFTY50 GLUCOSE METER 2.0) w/Device KIT, See admin instructions., Disp: , Rfl:    budesonide (PULMICORT) 0.5 MG/2ML nebulizer solution, Inhale into the lungs., Disp: , Rfl:    carvedilol (COREG) 25 MG tablet, Take 25 mg by mouth 2 (two) times daily. (Patient not taking: Reported on 04/10/2022), Disp: , Rfl:    carvedilol (COREG) 25 MG tablet, Take 1 tablet by mouth 2  (two) times daily with a meal., Disp: , Rfl:    celecoxib (CELEBREX) 200 MG capsule, Take 200 mg by mouth daily as needed for pain. (Patient not taking: Reported on 04/10/2022), Disp: , Rfl:    celecoxib (CELEBREX) 200 MG capsule, Take by mouth., Disp: , Rfl:    cetirizine (ZYRTEC) 10 MG tablet, Take 10 mg by mouth daily., Disp: , Rfl:    cholecalciferol (VITAMIN D) 25 MCG (1000 UNIT) tablet, Take 1,000 Units by mouth daily., Disp: , Rfl:    clonazePAM (KLONOPIN) 1 MG tablet, Take 1 mg by mouth 2 (two) times daily. (Patient not taking: Reported on 04/10/2022), Disp: , Rfl:    clopidogrel (PLAVIX) 75 MG tablet, clopidogrel 75 mg tablet  TAKE 1 TABLET (75 MG TOTAL) BY MOUTH ONCE DAILY., Disp: , Rfl:    Cysteamine Bitartrate (PROCYSBI) 300 MG PACK, Use 1 each 3 (three) times daily Use as instructed., Disp: , Rfl:    diclofenac Sodium (VOLTAREN) 1 % GEL, Apply 2 g topically 4 (four) times daily. (Patient not taking: Reported on 04/10/2022), Disp: , Rfl:    diclofenac Sodium (VOLTAREN) 1 % GEL, Apply 2 g topically 4 (four) times daily., Disp: , Rfl:    doxepin (SINEQUAN) 50 MG capsule, Take 150 mg by mouth at bedtime. (Patient not taking: Reported on 04/10/2022), Disp: , Rfl:    doxepin (SINEQUAN) 50 MG capsule, Take by mouth., Disp: , Rfl:    DULoxetine (CYMBALTA) 60 MG  capsule, Take 60 mg by mouth daily. (Patient not taking: Reported on 04/10/2022), Disp: , Rfl:    DULoxetine (CYMBALTA) 60 MG capsule, Take 1 tablet by mouth daily., Disp: , Rfl:    ferrous sulfate 325 (65 FE) MG tablet, Take 325 mg by mouth daily with breakfast., Disp: , Rfl:    fluticasone-salmeterol (ADVAIR) 500-50 MCG/ACT AEPB, Inhale 1 puff into the lungs every 12 (twelve) hours., Disp: , Rfl:    glucose blood (PRECISION QID TEST) test strip, Use 1 each (1 strip total) once daily Use as instructed., Disp: , Rfl:    HYDROcodone-acetaminophen (NORCO/VICODIN) 5-325 MG tablet, Take 1 tablet by mouth every 4 (four) hours as needed for pain.,  Disp: , Rfl:    ketoconazole (NIZORAL) 2 % cream, ketoconazole 2 % topical cream  APPLY TWICE DAILY TO RASH ON FACE AND GROIN, Disp: , Rfl:    lisinopril (ZESTRIL) 40 MG tablet, Take 40 mg by mouth daily., Disp: , Rfl:    losartan-hydrochlorothiazide (HYZAAR) 100-25 MG tablet, Take 1 tablet by mouth daily., Disp: , Rfl:    metFORMIN (GLUCOPHAGE) 500 MG tablet, Take 500-1,000 mg by mouth 2 (two) times daily with a meal. (Patient not taking: Reported on 04/10/2022), Disp: , Rfl:    metFORMIN (GLUCOPHAGE) 500 MG tablet, Take by mouth., Disp: , Rfl:    methadone (DOLOPHINE) 5 MG tablet, Take 2.5-5 mg by mouth 2 (two) times daily. Take 2.5 mg (one-half tablet) twice daily for one week then increase to 5 mg (one tablet) twice daily, thereafter, Disp: , Rfl:    montelukast (SINGULAIR) 10 MG tablet, Take 10 mg by mouth daily. (Patient not taking: Reported on 04/10/2022), Disp: , Rfl:    montelukast (SINGULAIR) 10 MG tablet, Take 1 tablet by mouth daily., Disp: , Rfl:    moxifloxacin (VIGAMOX) 0.5 % ophthalmic solution, moxifloxacin 0.5 % eye drops  PUT 1 DROP IN RIGHT EYE 4 TIMES A DAY FOR 7 DAYS OR AS DIRECTED, Disp: , Rfl:    Multiple Vitamins-Minerals (MULTIVITAMIN WITH MINERALS) tablet, Take 1 tablet by mouth daily., Disp: , Rfl:    naloxone (NARCAN) nasal spray 4 mg/0.1 mL, Place into the nose., Disp: , Rfl:    nitroGLYCERIN (NITROSTAT) 0.4 MG SL tablet, nitroglycerin 0.4 mg sublingual tablet  PLACE 1 TABLET UNDER THE TONGUE EVERY FOR CHEST PAIN, MAY TAKE UP TO 3 DOSES, Disp: , Rfl:    nystatin (MYCOSTATIN) 100000 UNIT/ML suspension, Take 2 mLs by mouth in the morning, at noon, in the evening, and at bedtime. (Patient not taking: Reported on 04/10/2022), Disp: , Rfl:    nystatin (MYCOSTATIN) 100000 UNIT/ML suspension, nystatin 100,000 unit/mL oral suspension  SWISH AND SWALLOW 5 MLS 4 (FOUR) TIMES DAILY., Disp: , Rfl:    omeprazole (PRILOSEC) 40 MG capsule, Take 40 mg by mouth daily. (Patient not  taking: Reported on 04/10/2022), Disp: , Rfl:    omeprazole (PRILOSEC) 40 MG capsule, Take 1 tablet by mouth daily., Disp: , Rfl:    oxyCODONE (OXY IR/ROXICODONE) 5 MG immediate release tablet, Take by mouth., Disp: , Rfl:    pimecrolimus (ELIDEL) 1 % cream, Apply 1 application topically 2 (two) times daily., Disp: , Rfl:    pimecrolimus (ELIDEL) 1 % cream, Elidel 1 % topical cream  APPLY TWICE DAILY TO AFFECTED AREA(S) FOR 1-2 WEEKS (Patient not taking: Reported on 04/10/2022), Disp: , Rfl:    polyethylene glycol (MIRALAX / GLYCOLAX) 17 g packet, Take 17 g by mouth 2 (two) times daily. (Patient  not taking: Reported on 04/10/2022), Disp: , Rfl:    polyethylene glycol powder (GLYCOLAX/MIRALAX) 17 GM/SCOOP powder, Take by mouth., Disp: , Rfl:    pregabalin (LYRICA) 75 MG capsule, Take 75 mg by mouth 3 (three) times daily., Disp: , Rfl:    Semaglutide,0.25 or 0.5MG /DOS, (OZEMPIC, 0.25 OR 0.5 MG/DOSE,) 2 MG/3ML SOPN, Inject into the skin., Disp: , Rfl:    senna-docusate (SENOKOT-S) 8.6-50 MG tablet, Take 1 tablet by mouth daily. (Patient not taking: Reported on 04/10/2022), Disp: , Rfl:    senna-docusate (SENOKOT-S) 8.6-50 MG tablet, Take 2 tablets by mouth daily., Disp: , Rfl:    spironolactone (ALDACTONE) 25 MG tablet, Take 12.5 mg by mouth daily., Disp: , Rfl:    SUMAtriptan (IMITREX) 50 MG tablet, Take 50 mg by mouth as directed., Disp: , Rfl:    tiZANidine (ZANAFLEX) 4 MG tablet, Take 4 mg by mouth 3 (three) times daily., Disp: , Rfl:    traMADol (ULTRAM) 50 MG tablet, Take 50 mg by mouth every 6 (six) hours as needed for pain., Disp: , Rfl:    vitamin B-12 (CYANOCOBALAMIN) 1000 MCG tablet, Take 1,000 mcg by mouth daily., Disp: , Rfl:   Past Medical History: No past medical history on file.  Tobacco Use: Social History   Tobacco Use  Smoking Status Former   Current packs/day: 0.00   Average packs/day: 0.3 packs/day for 8.0 years (2.0 ttl pk-yrs)   Types: Cigarettes   Start date: 03/16/1969    Quit date: 03/16/1977   Years since quitting: 45.6   Passive exposure: Past  Smokeless Tobacco Former    Labs: Investment banker, operational       Latest Ref Rng & Units 11/04/2020 11/05/2020  Labs for ITP Cardiac and Pulmonary Rehab  Hemoglobin A1c 4.8 - 5.6 % - 6.2   PH, Arterial 7.350 - 7.450 7.24  7.34   PCO2 arterial 32.0 - 48.0 mmHg 44  37   Bicarbonate 20.0 - 28.0 mmol/L 20.2  18.9  20.0   Acid-base deficit 0.0 - 2.0 mmol/L 6.9  8.2  5.2   O2 Saturation % 75.8  89.5  99.2     Details       Multiple values from one day are sorted in reverse-chronological order          Pulmonary Assessment Scores:   UCSD: Self-administered rating of dyspnea associated with activities of daily living (ADLs) 6-point scale (0 = "not at all" to 5 = "maximal or unable to do because of breathlessness")  Scoring Scores range from 0 to 120.  Minimally important difference is 5 units  CAT: CAT can identify the health impairment of COPD patients and is better correlated with disease progression.  CAT has a scoring range of zero to 40. The CAT score is classified into four groups of low (less than 10), medium (10 - 20), high (21-30) and very high (31-40) based on the impact level of disease on health status. A CAT score over 10 suggests significant symptoms.  A worsening CAT score could be explained by an exacerbation, poor medication adherence, poor inhaler technique, or progression of COPD or comorbid conditions.  CAT MCID is 2 points  mMRC: mMRC (Modified Medical Research Council) Dyspnea Scale is used to assess the degree of baseline functional disability in patients of respiratory disease due to dyspnea. No minimal important difference is established. A decrease in score of 1 point or greater is considered a positive change.   Pulmonary Function Assessment:   Exercise Target  Goals: Exercise Program Goal: Individual exercise prescription set using results from initial 6 min walk test and THRR while  considering  patient's activity barriers and safety.   Exercise Prescription Goal: Initial exercise prescription builds to 30-45 minutes a day of aerobic activity, 2-3 days per week.  Home exercise guidelines will be given to patient during program as part of exercise prescription that the participant will acknowledge.  Education: Aerobic Exercise: - Group verbal and visual presentation on the components of exercise prescription. Introduces F.I.T.T principle from ACSM for exercise prescriptions.  Reviews F.I.T.T. principles of aerobic exercise including progression. Written material given at graduation. Flowsheet Row Pulmonary Rehab from 06/15/2022 in [facility] Cardiac and Pulmonary Rehab  Education need identified 04/12/22  Date 05/11/22  Educator [facility]  Instruction Review Code 1- Verbalizes Understanding       Education: Resistance Exercise: - Group verbal and visual presentation on the components of exercise prescription. Introduces F.I.T.T principle from ACSM for exercise prescriptions  Reviews F.I.T.T. principles of resistance exercise including progression. Written material given at graduation. Flowsheet Row Pulmonary Rehab from 06/15/2022 in [facility] Cardiac and Pulmonary Rehab  Date 05/18/22  Educator NT  Instruction Review Code 1- Verbalizes Understanding        Education: Exercise & Equipment Safety: - Individual verbal instruction and demonstration of equipment use and safety with use of the equipment. Flowsheet Row Pulmonary Rehab from 06/15/2022 in [facility] Cardiac and Pulmonary Rehab  Date 04/10/22  Educator [facility]  Instruction Review Code 1- Verbalizes Understanding       Education: Exercise Physiology & General Exercise Guidelines: - Group verbal and written instruction with models to review the exercise physiology of the cardiovascular system and associated critical values. Provides general exercise guidelines with specific guidelines to those with heart or lung disease.    Education:  Flexibility, Balance, Mind/Body Relaxation: - Group verbal and visual presentation with interactive activity on the components of exercise prescription. Introduces F.I.T.T principle from ACSM for exercise prescriptions. Reviews F.I.T.T. principles of flexibility and balance exercise training including progression. Also discusses the mind body connection.  Reviews various relaxation techniques to help reduce and manage stress (i.e. Deep breathing, progressive muscle relaxation, and visualization). Balance handout provided to take home. Written material given at graduation. Flowsheet Row Pulmonary Rehab from 06/15/2022 in [facility] Cardiac and Pulmonary Rehab  Date 05/18/22  Educator NT  Instruction Review Code 1- Verbalizes Understanding       Activity Barriers & Risk Stratification:   6 Minute Walk:  Oxygen Initial Assessment:  Oxygen Initial Assessment - 06/06/22 0803       Home Oxygen   Home Oxygen Device Home Concentrator    Sleep Oxygen Prescription Continuous;CPAP   new sleep study completed, waiting on results   Home Exercise Oxygen Prescription Continuous   prn   Liters per minute 2    Home Resting Oxygen Prescription Continuous   prn   Liters per minute 2    Compliance with Home Oxygen Use Yes   only prn     Program Oxygen Prescription   Program Oxygen Prescription None   Can use O2 prn     Intervention   Short Term Goals To learn and exhibit compliance with exercise, home and travel O2 prescription;To learn and understand importance of monitoring SPO2 with pulse oximeter and demonstrate accurate use of the pulse oximeter.;To learn and understand importance of maintaining oxygen saturations>88%;To learn and demonstrate proper pursed lip breathing techniques or other breathing techniques. ;To learn and demonstrate proper use of  respiratory medications    Long  Term Goals Verbalizes importance of monitoring SPO2 with pulse oximeter and return demonstration;Exhibits proper breathing  techniques, such as pursed lip breathing or other method taught during program session;Demonstrates proper use of MDI's;Compliance with respiratory medication;Maintenance of O2 saturations>88%;Exhibits compliance with exercise, home  and travel O2 prescription             Oxygen Re-Evaluation:  Oxygen Re-Evaluation     Row Name 05/23/22 0756 06/06/22 0803 07/06/22 0804         Program Oxygen Prescription   Program Oxygen Prescription None -- None       Home Oxygen   Home Oxygen Device Home Concentrator -- Home Concentrator     Sleep Oxygen Prescription Continuous;CPAP -- Continuous;CPAP     Liters per minute 0 -- 2     Home Exercise Oxygen Prescription Continuous -- None     Liters per minute 2 -- --     Home Resting Oxygen Prescription Continuous -- None     Liters per minute 2 -- --     Compliance with Home Oxygen Use No -- Yes  except CPAP, waiting for new mask       Goals/Expected Outcomes   Short Term Goals To learn and exhibit compliance with exercise, home and travel O2 prescription;To learn and understand importance of monitoring SPO2 with pulse oximeter and demonstrate accurate use of the pulse oximeter.;To learn and understand importance of maintaining oxygen saturations>88%;To learn and demonstrate proper pursed lip breathing techniques or other breathing techniques. ;To learn and demonstrate proper use of respiratory medications -- To learn and exhibit compliance with exercise, home and travel O2 prescription;To learn and understand importance of monitoring SPO2 with pulse oximeter and demonstrate accurate use of the pulse oximeter.;To learn and understand importance of maintaining oxygen saturations>88%;To learn and demonstrate proper pursed lip breathing techniques or other breathing techniques. ;To learn and demonstrate proper use of respiratory medications     Long  Term Goals Verbalizes importance of monitoring SPO2 with pulse oximeter and return demonstration;Exhibits  proper breathing techniques, such as pursed lip breathing or other method taught during program session;Demonstrates proper use of MDI's;Compliance with respiratory medication;Maintenance of O2 saturations>88%;Exhibits compliance with exercise, home  and travel O2 prescription -- Verbalizes importance of monitoring SPO2 with pulse oximeter and return demonstration;Exhibits proper breathing techniques, such as pursed lip breathing or other method taught during program session;Demonstrates proper use of MDI's;Compliance with respiratory medication;Maintenance of O2 saturations>88%;Exhibits compliance with exercise, home  and travel O2 prescription     Comments Al is scheduled for a sleep study as his equipment has not been working well for him and is out of date. He tried to use his CPAP but it was putting too much pressure on his teeth and nose.  He has been wearing his oxygen at home.  His saturations are doing pretty good overall.  Walking will still drop him on occassion.  He is doing well with his PLB. Al continues to do well. He just had a completed a sleep study this past Sunday and is awaiting the results. He is supposed to be getting a new mask this will fit him better with his CPAP. His oxygen saturations at rehab have been staying above 88% on RA. He continues to check them at home and uses oxygen as needed. Continues to use PLB. Al has not been able to use his CPAP at night as his mask does not fit.  He is still waiting  to hear back from doctor about it.  He is wearing oxygen at night.  He is not needing his oxygen during the day except on occassion when he feels short of breath and then he will use it to help recover.  He is good about using his PLB and inhalers.     Goals/Expected Outcomes Short; Get sleep study done Long; Continue to use PLB Short: Get results from sleep study and follow doctor orders on CPAP Long: Continue to monitor O2 and use PLB long-term Shrot: Continue to use O2 as needed Long:  conitnue to use PLB              Oxygen Discharge (Final Oxygen Re-Evaluation):  Oxygen Re-Evaluation - 07/06/22 0804       Program Oxygen Prescription   Program Oxygen Prescription None      Home Oxygen   Home Oxygen Device Home Concentrator    Sleep Oxygen Prescription Continuous;CPAP    Liters per minute 2    Home Exercise Oxygen Prescription None    Home Resting Oxygen Prescription None    Compliance with Home Oxygen Use Yes   except CPAP, waiting for new mask     Goals/Expected Outcomes   Short Term Goals To learn and exhibit compliance with exercise, home and travel O2 prescription;To learn and understand importance of monitoring SPO2 with pulse oximeter and demonstrate accurate use of the pulse oximeter.;To learn and understand importance of maintaining oxygen saturations>88%;To learn and demonstrate proper pursed lip breathing techniques or other breathing techniques. ;To learn and demonstrate proper use of respiratory medications    Long  Term Goals Verbalizes importance of monitoring SPO2 with pulse oximeter and return demonstration;Exhibits proper breathing techniques, such as pursed lip breathing or other method taught during program session;Demonstrates proper use of MDI's;Compliance with respiratory medication;Maintenance of O2 saturations>88%;Exhibits compliance with exercise, home  and travel O2 prescription    Comments Al has not been able to use his CPAP at night as his mask does not fit.  He is still waiting to hear back from doctor about it.  He is wearing oxygen at night.  He is not needing his oxygen during the day except on occassion when he feels short of breath and then he will use it to help recover.  He is good about using his PLB and inhalers.    Goals/Expected Outcomes Shrot: Continue to use O2 as needed Long: conitnue to use PLB             Initial Exercise Prescription:   Perform Capillary Blood Glucose checks as needed.  Exercise Prescription  Changes:   Exercise Prescription Changes     Row Name 05/18/22 1200 06/01/22 0800 06/01/22 1400 06/13/22 1500 06/28/22 0700     Response to Exercise   Blood Pressure (Admit) 104/66 -- 142/70 124/70 126/62   Blood Pressure (Exercise) -- -- 136/76 -- --   Blood Pressure (Exit) 112/62 -- 124/62 120/68 130/80   Heart Rate (Admit) 72 bpm -- 72 bpm 78 bpm 71 bpm   Heart Rate (Exercise) 93 bpm -- 104 bpm 92 bpm 87 bpm   Heart Rate (Exit) 84 bpm -- 85 bpm 81 bpm 83 bpm   Oxygen Saturation (Admit) 96 % -- 94 % 95 % 96 %   Oxygen Saturation (Exercise) 91 % -- 90 % 93 % 93 %   Oxygen Saturation (Exit) 95 % -- 97 % 96 % 95 %   Rating of Perceived Exertion (Exercise) 16 -- 15  15 15   Perceived Dyspnea (Exercise) 3 -- 3 2 2    Symptoms SOB -- SOB, dizziness SOB SOB   Duration Progress to 30 minutes of  aerobic without signs/symptoms of physical distress -- Progress to 30 minutes of  aerobic without signs/symptoms of physical distress Continue with 30 min of aerobic exercise without signs/symptoms of physical distress. Continue with 30 min of aerobic exercise without signs/symptoms of physical distress.   Intensity THRR unchanged -- THRR unchanged THRR unchanged THRR unchanged     Progression   Progression Continue to progress workloads to maintain intensity without signs/symptoms of physical distress. -- Continue to progress workloads to maintain intensity without signs/symptoms of physical distress. Continue to progress workloads to maintain intensity without signs/symptoms of physical distress. Continue to progress workloads to maintain intensity without signs/symptoms of physical distress.   Average METs 1.95 -- 1.67 2 2.18     Resistance Training   Training Prescription Yes -- Yes Yes Yes   Weight 3 lb -- 3 lb 3 lb 3 lb   Reps 10-15 -- 10-15 10-15 10-15     Interval Training   Interval Training No -- No No No     Oxygen   Oxygen --  prn -- -- -- --     NuStep   Level 1 -- 1 1 3    Minutes  30 -- 15 30 30    METs 2 -- -- 2.9 2.5     Biostep-RELP   Level 1 -- 2 2 2    Minutes 15 -- 30 30 30    METs 2 -- 2 2 2      Track   Laps 2  trying out to start -- 2  dizzy -- --   Minutes 15 -- 15 -- --   METs 1 -- 1 -- --     Home Exercise Plan   Plans to continue exercise at -- Home (comment)  walking,  recumbent bike Home (comment)  walking,  recumbent bike Home (comment)  walking,  recumbent bike Home (comment)  walking,  recumbent bike   Frequency -- Add 3 additional days to program exercise sessions. Add 3 additional days to program exercise sessions. Add 3 additional days to program exercise sessions. Add 3 additional days to program exercise sessions.   Initial Home Exercises Provided -- 06/01/22 06/01/22 06/01/22 06/01/22     Oxygen   Maintain Oxygen Saturation 88% or higher -- 88% or higher 88% or higher 88% or higher    Row Name 07/13/22 1300 07/26/22 0900 08/10/22 1400 08/24/22 1000 09/07/22 0900     Response to Exercise   Blood Pressure (Admit) 122/76 116/78 118/78 118/70 128/72   Blood Pressure (Exit) 118/78 114/70 110/74 116/68 132/80   Heart Rate (Admit) 93 bpm 75 bpm 73 bpm 81 bpm 85 bpm   Heart Rate (Exercise) 84 bpm 88 bpm 89 bpm 90 bpm 86 bpm   Heart Rate (Exit) 79 bpm 73 bpm 73 bpm 84 bpm 84 bpm   Oxygen Saturation (Admit) 92 % 95 % 97 % 91 % 94 %   Oxygen Saturation (Exercise) 94 % 92 % 92 % 96 % 93 %   Oxygen Saturation (Exit) 97 % 97 % 97 % 98 % 95 %   Rating of Perceived Exertion (Exercise) 13 12 14 12 12    Perceived Dyspnea (Exercise) 2 2 2 2 2    Symptoms SOB SOB SOB SOB SOB   Duration Continue with 30 min of aerobic exercise without signs/symptoms of physical  distress. Continue with 30 min of aerobic exercise without signs/symptoms of physical distress. Continue with 30 min of aerobic exercise without signs/symptoms of physical distress. Continue with 30 min of aerobic exercise without signs/symptoms of physical distress. Continue with 30 min of aerobic  exercise without signs/symptoms of physical distress.   Intensity THRR unchanged THRR unchanged THRR unchanged THRR unchanged THRR unchanged     Progression   Progression Continue to progress workloads to maintain intensity without signs/symptoms of physical distress. Continue to progress workloads to maintain intensity without signs/symptoms of physical distress. Continue to progress workloads to maintain intensity without signs/symptoms of physical distress. Continue to progress workloads to maintain intensity without signs/symptoms of physical distress. Continue to progress workloads to maintain intensity without signs/symptoms of physical distress.   Average METs 2.35 2.18 2.25 2 2.5     Resistance Training   Training Prescription Yes Yes Yes Yes Yes   Weight 3 lb 3 lb 3 lb 3 lb 3 lb   Reps 10-15 10-15 10-15 10-15 10-15     Interval Training   Interval Training No No No No No     NuStep   Level 2 3 2  -- 2   Minutes 30 30 30  -- 15   METs 2.7 2.4 2.6 -- 2.4     Biostep-RELP   Level 2 2 2 2  --   Minutes 30 30 30 30  --   METs 2 2 2 2  --     Home Exercise Plan   Plans to continue exercise at Home (comment)  walking,  recumbent bike Home (comment)  walking,  recumbent bike Home (comment)  walking,  recumbent bike Home (comment)  walking,  recumbent bike Home (comment)  walking,  recumbent bike   Frequency Add 3 additional days to program exercise sessions. Add 3 additional days to program exercise sessions. Add 3 additional days to program exercise sessions. Add 3 additional days to program exercise sessions. Add 3 additional days to program exercise sessions.   Initial Home Exercises Provided 06/01/22 06/01/22 06/01/22 06/01/22 06/01/22     Oxygen   Maintain Oxygen Saturation 88% or higher 88% or higher 88% or higher 88% or higher 88% or higher    Row Name 09/19/22 1400 10/02/22 0900           Response to Exercise   Blood Pressure (Admit) 106/70 118/66      Blood Pressure  (Exit) 96/60 126/70      Heart Rate (Admit) 81 bpm 74 bpm      Heart Rate (Exercise) 80 bpm 86 bpm      Heart Rate (Exit) 76 bpm 73 bpm      Oxygen Saturation (Admit) 94 % 93 %      Oxygen Saturation (Exercise) 92 % 85 %      Oxygen Saturation (Exit) 94 % 95 %      Rating of Perceived Exertion (Exercise) 12 14      Perceived Dyspnea (Exercise) 2 3      Symptoms SOB SOB      Duration Continue with 30 min of aerobic exercise without signs/symptoms of physical distress. Continue with 30 min of aerobic exercise without signs/symptoms of physical distress.      Intensity THRR unchanged THRR unchanged        Progression   Progression Continue to progress workloads to maintain intensity without signs/symptoms of physical distress. Continue to progress workloads to maintain intensity without signs/symptoms of physical distress.      Average  METs 2.35 2.03        Resistance Training   Training Prescription Yes Yes      Weight 3 lb 3 lb      Reps 10-15 10-15        Interval Training   Interval Training No No        NuStep   Level 3 2      Minutes 15 15      METs 2.7 2.1        Biostep-RELP   Level -- 2      Minutes -- 30      METs -- 2        Home Exercise Plan   Plans to continue exercise at Home (comment)  walking,  recumbent bike Home (comment)  walking,  recumbent bike      Frequency Add 3 additional days to program exercise sessions. Add 3 additional days to program exercise sessions.      Initial Home Exercises Provided 06/01/22 06/01/22        Oxygen   Maintain Oxygen Saturation 88% or higher 88% or higher               Exercise Comments:   Exercise Goals and Review:   Exercise Goals Re-Evaluation :  Exercise Goals Re-Evaluation     Row Name 05/18/22 1209 05/23/22 0749 06/01/22 0819 06/01/22 1413 06/06/22 0757     Exercise Goal Re-Evaluation   Exercise Goals Review Increase Physical Activity;Increase Strength and Stamina;Understanding of Exercise Prescription  Increase Physical Activity;Increase Strength and Stamina;Understanding of Exercise Prescription Increase Physical Activity;Increase Strength and Stamina;Understanding of Exercise Prescription;Able to understand and use rate of perceived exertion (RPE) scale;Able to understand and use Dyspnea scale;Able to check pulse independently;Knowledge and understanding of Target Heart Rate Range (THRR) Increase Physical Activity;Increase Strength and Stamina;Understanding of Exercise Prescription Increase Physical Activity;Increase Strength and Stamina;Understanding of Exercise Prescription   Comments Al missed a couple sessions last review as he out town. He has been staying on the T4 Nustep as he is limited on fitting on machines. He also is limited by his breathing when walking. He tried out walking a couple laps on the track, we hope to that improve over time. His oxygen has been stayinh above 88%. We will continue to monitor. Al is doing well in rehab.  He has been using his bike/seated elliptical at home.  He will usually go for about 15-20 min.  We talked about continuing to build up his time to 30 min.  He is trying to get there.  He does feel like his stamina is starting to get better. Reviewed home exercise with pt today.  Pt plans to walk and use recumbent bike at home for exercise.  Reviewed THR, pulse, RPE, sign and symptoms, pulse oximetery and when to call 911 or MD.  Also discussed weather considerations and indoor options.  Pt voiced understanding. Jawad is doing well in rehab. He has continued to try and walk the track but has been limited due to dizziness. He was able to improve to level 2 on the biostep and tolerated it for 30 minutes. He has continued to use 3 lb hand weights for resistance training as well. We will continue to monitor his progress in the program. Al states he has been using his recumbent bike for exercise. He uses it for about 15 minutes at a time, we talked about slowly accumulating  his time up, and taking breaks when needed to. He practices  PLB when he needs to and checks his O2, he states his O2 stays above 88%.  He continues to check his HR. He is using light therapy to help with neuropathy 2x/day for 90 days. He has seen an improvement so far and is hoping it may help improve his blaance and walking.   Expected Outcomes Short: Slowly add in more laps on track Long: Continue to increase overall MET level and stamina Short: Continue to add time in on bike at home Long: Conitnue to improve stamina Short: Start to add in exercise more at home Long: Continue to improve stamina Short: Continue to push for more laps on the track. Long: Continue to improve strength and stamina. Short: Slowly build up tolerance on recumbent bike, monitor HR and O2 during Long: Continue independent exercise at home at appropriate prescription    Row Name 06/13/22 1559 06/28/22 0737 07/06/22 0750 07/13/22 1312 07/26/22 0915     Exercise Goal Re-Evaluation   Exercise Goals Review Increase Physical Activity;Increase Strength and Stamina;Understanding of Exercise Prescription Increase Physical Activity;Increase Strength and Stamina;Understanding of Exercise Prescription Increase Physical Activity;Increase Strength and Stamina;Understanding of Exercise Prescription Increase Physical Activity;Increase Strength and Stamina;Understanding of Exercise Prescription Increase Physical Activity;Increase Strength and Stamina;Understanding of Exercise Prescription   Comments Al continues to come to rehab, has missed a couple sessions due to transportation issues. He usually walks to and from rehab and stays on seated machines while at rehab. He has been working at level 2 on [employer] but continues to exercise at level 1 on the T4 Nustep. He would benefit from also increasing that to level 2. He is still limited by his SOB when exercising, with an average RPE of 15. We will continue to monitor. Al is doing well in rehab,  although he has only attended rehab twic since the last review. He increased his overall average MET level to 2.18 METs. He also improved to level 3 on the T4 nustep and continued to work at level 2 on the biostep. His O2 saturations have stayed above 93% during exercise since the last review as well. We will continue to monitor his progress in the program. Al is doing well in rehab. He is up to 15-37min on his recumbent bike at home.  His stamina is not getting too much better.  He feels he has a lot to get done, but not able to do it all.  He is also doing PT and neurorehab for nerves in his legs. Al is doing wellin rehab.  He does 30 min on NuStep or BioStep each day and is now on level 2 for the full time.  We will continue to monitor his progress. Al is doing wellin rehab. He continues to do well with 30 min on the T4 nustep or biostep each day. He also improved back up to level 3 on the T4 nustep, while continuing to work at level 2 on the biostep. He also has stayed consistent with 3 lb hand weights for resistance training. We will continue to monitor his progress in the program.   Expected Outcomes Short: Increase to level 2 on the Nustep Long: Continue to increase overall MET level and stamina Short: Continue to progressively increase workloads. Long: Continue to increase strength and stamina. Short: Continue to rehab nerves Long: Conitnue to exercise independent Short: Continue to increase workloads for longer periods of time Long: continue to improve stamina Short: Continue to progressively increase workloads for longer periods of time. Long: Continue  to improve strength and stamina.    Row Name 08/10/22 1436 08/24/22 1009 09/07/22 0909 09/19/22 1500 10/02/22 0934     Exercise Goal Re-Evaluation   Exercise Goals Review Increase Physical Activity;Increase Strength and Stamina;Understanding of Exercise Prescription Increase Physical Activity;Increase Strength and Stamina;Understanding of Exercise  Prescription Increase Physical Activity;Increase Strength and Stamina;Understanding of Exercise Prescription Increase Physical Activity;Increase Strength and Stamina;Understanding of Exercise Prescription Increase Physical Activity;Increase Strength and Stamina;Understanding of Exercise Prescription   Comments Al is doing well in rehab. He continues to work for 30 minutes on the biostep and T4 nustep at level 2. He also continues to do well with 3 lb hand weights for resistance training. We will continue to monitor his progress in the program. Al continues to maintain his workloads. He was absent 3 sessions this review period,  Will encuorage increasing workloads as tolerated. Will continue to monitor exercise progression with goal of increased stamina and strength. Al attended 1 of 4 sesions. He maintained his workloads. He continued to use 3 lb hand weights.   Will continue to encourage and monitor exercise  progression. Al is doing well in rehab. He has only attended one session since the time of the last review. His average METs did decrease to 2.35 METs. He did however increase his level on the T4 nustep from 2 to 3. We will continue to monitor his progress in the program. Al is doing well in the program. He continues to work hard on the T4 nustep, and Biostep but still experiences SOB and is recovering from a wrist injury. He has returned his previous intensities on the Biostep and T4 nustep. We will continue to monitor his progress in the program.   Expected Outcomes Short: Try level 3 on the biostep, go back up to level 3 on the T4 nustep. Long: Continue to increase overall METs and stamina. STG Increase workloads as tolerated with goal of increased stamina and strength.  Try Biostep at level 3.  LTG Continued exercise progression during program and after discharge STG have Al incresae workloads as tolerated. Suggest intervals of higher level ,back to lower level until he can manage the new level for 15  minutes. LTG: Continued exercise progression as tolerated during program and after discharge. Short: Return to regular attendance in the program. Long: Continue exercise to improve strength and stamina. Short: Suggest increasing level on Biostep and T4 nustep to level 3. Long: Continue exercise to improve strength and stamina.    Row Name 10/17/22 1451 10/31/22 1513           Exercise Goal Re-Evaluation   Exercise Goals Review Increase Physical Activity;Increase Strength and Stamina;Understanding of Exercise Prescription Increase Physical Activity;Increase Strength and Stamina;Understanding of Exercise Prescription      Comments Al has not attended the program during this review. We will reach out to him to determine his return date. Al has not attended the program during this review. We will reach out to him to determine his return date.      Expected Outcomes Short: Return to the program. Long: Continue exercise to improve strength and stamina. Short: Return to the program. Long: Continue exercise to improve strength and stamina.               Discharge Exercise Prescription (Final Exercise Prescription Changes):  Exercise Prescription Changes - 10/02/22 0900       Response to Exercise   Blood Pressure (Admit) 118/66    Blood Pressure (Exit) 126/70  Heart Rate (Admit) 74 bpm    Heart Rate (Exercise) 86 bpm    Heart Rate (Exit) 73 bpm    Oxygen Saturation (Admit) 93 %    Oxygen Saturation (Exercise) 85 %    Oxygen Saturation (Exit) 95 %    Rating of Perceived Exertion (Exercise) 14    Perceived Dyspnea (Exercise) 3    Symptoms SOB    Duration Continue with 30 min of aerobic exercise without signs/symptoms of physical distress.    Intensity THRR unchanged      Progression   Progression Continue to progress workloads to maintain intensity without signs/symptoms of physical distress.    Average METs 2.03      Resistance Training   Training Prescription Yes    Weight 3 lb     Reps 10-15      Interval Training   Interval Training No      NuStep   Level 2    Minutes 15    METs 2.1      Biostep-RELP   Level 2    Minutes 30    METs 2      Home Exercise Plan   Plans to continue exercise at Home (comment)   walking,  recumbent bike   Frequency Add 3 additional days to program exercise sessions.    Initial Home Exercises Provided 06/01/22      Oxygen   Maintain Oxygen Saturation 88% or higher             Nutrition:  Target Goals: Understanding of nutrition guidelines, daily intake of sodium 1500mg , cholesterol 200mg , calories 30% from fat and 7% or less from saturated fats, daily to have 5 or more servings of fruits and vegetables.  Education: All About Nutrition: -Group instruction provided by verbal, written material, interactive activities, discussions, models, and posters to present general guidelines for heart healthy nutrition including fat, fiber, MyPlate, the role of sodium in heart healthy nutrition, utilization of the nutrition label, and utilization of this knowledge for meal planning. Follow up email sent as well. Written material given at graduation. Flowsheet Row Pulmonary Rehab from 06/15/2022 in [facility] Cardiac [facility] Pulmonary Rehab  Education need identified 04/12/22       Biometrics:    Nutrition Therapy Plan and Nutrition Goals:  Nutrition Therapy & Goals - 06/06/22 0825       Nutrition Therapy   RD appointment deferred Yes             Nutrition Assessments:  MEDIFICTS Score Key: >=70 Need to make dietary changes  40-70 Heart Healthy Diet <= 40 Therapeutic Level Cholesterol Diet  Flowsheet Row Pulmonary Rehab from 04/12/2022 in [facility] Cardiac and Pulmonary Rehab  Picture Your Plate Total Score on Admission 44      Picture Your Plate Scores: <47 Unhealthy dietary pattern with much room for improvement. 41-50 Dietary pattern unlikely to meet recommendations for good health and room for improvement. 51-60 More  healthful dietary pattern, with some room for improvement.  >60 Healthy dietary pattern, although there may be some specific behaviors that could be improved.   Nutrition Goals Re-Evaluation:  Nutrition Goals Re-Evaluation     Row Name 05/23/22 0751 06/06/22 0811 07/06/22 0759         Goals   Nutrition Goal Work on mechanical eating -- Short: Continue to work on [employer] Long: Continue to eat pulmonary healthy based diet     Comment Al says that he is not hungry often but is eating some.  We talked about getting in at least two meals a day.  We talked about using meal replacement bars/shakes to help.  We talked about making sure he gets in enough calories each day. Patient has deferred talking to a RD at this time. He is working on trying to eat several smaller meals/ day and eating enough calories and macronutrients to meet his needs. Al is doing well in rehab.  He is eating small meals but feels that has become his new normal. He is trying to get in protein and varitey.     Expected Outcome Short: Try meal replacement options Long: conitnue to improve diet Short: Continue to work on [employer] Long: Continue to eat pulmonary healthy based diet Continue to work on eating around the clock.              Nutrition Goals Discharge (Final Nutrition Goals Re-Evaluation):  Nutrition Goals Re-Evaluation - 07/06/22 0759       Goals   Nutrition Goal Short: Continue to work on smaller/frequent meals Long: Continue to eat pulmonary healthy based diet    Comment Al is doing well in rehab.  He is eating small meals but feels that has become his new normal. He is trying to get in protein and varitey.    Expected Outcome Continue to work on eating around the clock.             Psychosocial: Target Goals: Acknowledge presence or absence of significant depression and/or stress, maximize coping skills, provide positive support system. Participant is able to verbalize  types and ability to use techniques and skills needed for reducing stress and depression.   Education: Stress, Anxiety, and Depression - Group verbal and visual presentation to define topics covered.  Reviews how body is impacted by stress, anxiety, and depression.  Also discusses healthy ways to reduce stress and to treat/manage anxiety and depression.  Written material given at graduation. Flowsheet Row Pulmonary Rehab from 06/15/2022 in [facility] Cardiac and Pulmonary Rehab  Date 04/27/22  Educator [facility]  Instruction Review Code 1- [employer] Understanding       Education: Sleep Hygiene -Provides group verbal and written instruction about how sleep can affect your health.  Define sleep hygiene, discuss sleep cycles and impact of sleep habits. Review good sleep hygiene tips.    Initial Review & Psychosocial Screening:   Quality of Life Scores:  Scores of 19 and below usually indicate a poorer quality of life in these areas.  A difference of  2-3 points is a clinically meaningful difference.  A difference of 2-3 points in the total score of the Quality of Life Index has been associated with significant improvement in overall quality of life, self-image, physical symptoms, and general health in studies assessing change in quality of life.  PHQ-9: Review Flowsheet       05/11/2022 04/12/2022  Depression screen PHQ 2/9  Decreased Interest 1 2  Down, Depressed, Hopeless 1 1  PHQ - 2 Score 2 3  Altered sleeping 3 2  Tired, decreased energy 3 2  Change in appetite 3 2  Feeling bad or failure about yourself  1 0  Trouble concentrating 0 0  Moving slowly or fidgety/restless 0 1  Suicidal thoughts 0 0  PHQ-9 Score 12 10  Difficult doing work/chores Somewhat difficult Somewhat difficult    Details           Interpretation of Total Score  Total Score Depression Severity:  1-4 = Minimal depression, 5-9 =  Mild depression, 10-14 = Moderate depression, 15-19 = Moderately severe depression,  20-27 = Severe depression   Psychosocial Evaluation and Intervention:   Psychosocial Re-Evaluation:  Psychosocial Re-Evaluation     Row Name 06/06/22 0813 07/06/22 4098           Psychosocial Re-Evaluation   Current issues with Current Stress Concerns Current Stress Concerns      Comments Al just completed a new sleep study this last Sunday to get a new CPAP, getting fit for a new mask. He is still waiting on the results. Wakes up from chronic pain at night which he following pain management. His biggest stressor is that his roof is leaking and may need a new one costing him a financial burden. Coming to rehab helps him get his mind off of things and hopes to continue to see improvement. Al is doing well in rehab.  He is still getting frustruted with not being able to get every thing done.  He is going to pulm rehab and neuro rehab as well.  He is still taking his supplements. He is still waiting on his sleep study to be reviewed with him to get new mask. He is still in process of getting new roof put on and he is still paying for it as well.  He is getting in his exercise and hoping to feel better once it is all done.      Expected Outcomes Short: Get results from sleep study Long: Continue to utilize exercise for stress management and positive attitude Short: Continue to try to hear back about sleep study Long: Conitnue to improve stamina and exercise for mental boost      Interventions Encouraged to attend Pulmonary Rehabilitation for the exercise Encouraged to attend Pulmonary Rehabilitation for the exercise      Continue Psychosocial Services  Follow up required by staff Follow up required by staff               Psychosocial Discharge (Final Psychosocial Re-Evaluation):  Psychosocial Re-Evaluation - 07/06/22 1191       Psychosocial Re-Evaluation   Current issues with Current Stress Concerns    Comments Al is doing well in rehab.  He is still getting frustruted with not being able  to get every thing done.  He is going to pulm rehab and neuro rehab as well.  He is still taking his supplements. He is still waiting on his sleep study to be reviewed with him to get new mask. He is still in process of getting new roof put on and he is still paying for it as well.  He is getting in his exercise and hoping to feel better once it is all done.    Expected Outcomes Short: Continue to try to hear back about sleep study Long: Conitnue to improve stamina and exercise for mental boost    Interventions Encouraged to attend Pulmonary Rehabilitation for the exercise    Continue Psychosocial Services  Follow up required by staff             Education: Education Goals: Education classes will be provided on a weekly basis, covering required topics. Participant will state understanding/return demonstration of topics presented.  Learning Barriers/Preferences:   General Pulmonary Education Topics:  Infection Prevention: - Provides verbal and written material to individual with discussion of infection control including proper hand washing and proper equipment cleaning during exercise session. Flowsheet Row Pulmonary Rehab from 06/15/2022 in [facility] Cardiac and Pulmonary Rehab  Date 04/10/22  Educator [facility]  Instruction Review Code 1- Verbalizes Understanding       Falls Prevention: - Provides verbal and written material to individual with discussion of falls prevention and safety. Flowsheet Row Pulmonary Rehab from 06/15/2022 in [facility] Cardiac and Pulmonary Rehab  Date 04/10/22  Educator [facility]  Instruction Review Code 1- Verbalizes Understanding       Chronic Lung Disease Review: - Group verbal instruction with posters, models, PowerPoint presentations and videos,  to review new updates, new respiratory medications, new advancements in procedures and treatments. Providing information on websites and "800" numbers for continued self-education. Includes information about supplement oxygen,  available portable oxygen systems, continuous and intermittent flow rates, oxygen safety, concentrators, and Medicare reimbursement for oxygen. Explanation of Pulmonary Drugs, including class, frequency, complications, importance of spacers, rinsing mouth after steroid MDI's, and proper cleaning methods for nebulizers. Review of basic lung anatomy and physiology related to function, structure, and complications of lung disease. Review of risk factors. Discussion about methods for diagnosing sleep apnea and types of masks and machines for OSA. Includes a review of the use of types of environmental controls: home humidity, furnaces, filters, dust mite/pet prevention, HEPA vacuums. Discussion about weather changes, air quality and the benefits of nasal washing. Instruction on Warning signs, infection symptoms, calling MD promptly, preventive modes, and value of vaccinations. Review of effective airway clearance, coughing and/or vibration techniques. Emphasizing that all should Create an Action Plan. Written material given at graduation. Flowsheet Row Pulmonary Rehab from 06/15/2022 in [facility] Cardiac and Pulmonary Rehab  Education need identified 04/12/22       AED/CPR: - Group verbal and written instruction with the use of models to demonstrate the basic use of the AED with the basic ABC's of resuscitation.    Anatomy and Cardiac Procedures: - Group verbal and visual presentation and models provide information about basic cardiac anatomy and function. Reviews the testing methods done to diagnose heart disease and the outcomes of the test results. Describes the treatment choices: Medical Management, Angioplasty, or Coronary Bypass Surgery for treating various heart conditions including Myocardial Infarction, Angina, Valve Disease, and Cardiac Arrhythmias.  Written material given at graduation.   Medication Safety: - Group verbal and visual instruction to review commonly prescribed medications for heart and  lung disease. Reviews the medication, class of the drug, and side effects. Includes the steps to properly store meds and maintain the prescription regimen.  Written material given at graduation. Flowsheet Row Pulmonary Rehab from 06/15/2022 in [facility] Cardiac and Pulmonary Rehab  Date 06/15/22  Educator SB  Instruction Review Code 1- Verbalizes Understanding       Other: -Provides group and verbal instruction on various topics (see comments)   Knowledge Questionnaire Score:    Core Components/Risk Factors/Patient Goals at Admission:   Education:Diabetes - Individual verbal and written instruction to review signs/symptoms of diabetes, desired ranges of glucose level fasting, after meals and with exercise. Acknowledge that pre and post exercise glucose checks will be done for 3 sessions at entry of program. Flowsheet Row Pulmonary Rehab from 06/15/2022 in [facility] Cardiac and Pulmonary Rehab  Date 04/10/22  Educator [facility]  Instruction Review Code 1- Verbalizes Understanding       Know Your Numbers and Heart Failure: - Group verbal and visual instruction to discuss disease risk factors for cardiac and pulmonary disease and treatment options.  Reviews associated critical values for Overweight/Obesity, Hypertension, Cholesterol, and Diabetes.  Discusses basics of heart failure: signs/symptoms and treatments.  Introduces Heart Failure Zone chart  for action plan for heart failure.  Written material given at graduation.   Core Components/Risk Factors/Patient Goals Review:   Goals and Risk Factor Review     Row Name 05/23/22 0753 06/06/22 0805 07/06/22 0801         Core Components/Risk Factors/Patient Goals Review   Personal Goals Review Weight Management/Obesity;Improve shortness of breath with ADL's;Increase knowledge of respiratory medications and ability to use respiratory devices properly.;Diabetes;Hypertension Weight Management/Obesity;Improve shortness of breath with ADL's;Increase knowledge  of respiratory medications and ability to use respiratory devices properly.;Diabetes;Hypertension Weight Management/Obesity;Improve shortness of breath with ADL's;Increase knowledge of respiratory medications and ability to use respiratory devices properly.;Diabetes;Hypertension     Review Al is doing well in rehab.  His weight is trending down and is down to 211 here and 205 at home.  He is pleased with his progress.  His blood pressures are doing well haning around 130s/80s.  His sugars have been around 130s as well.  He checks both daily at home.  His breathing is rough today.  Overall, it has been getting better. He is using his inhaler and nebulizer each day. Al states he feels like he's doing a lot at home at the moment as he is participating in light therapy for his neuropathy in addition to his home exercise. He has good and bad breathing days and continues to monitor his O2 and using his oxygen prn. He is taking all medications OK. Continues to use his inhaler and nebulizer. He is now able to walk down here using his walker. BP has been running nicely at home and rehab, ranging around 120/70s. He checks it daily. So far he has maintained his weight. Overall he has lost 20 lbs in 3 months from Ozempic and wants to be under 200 lb. Today he was around 210 lb. He checks his  blood sugar every morning which is ranging around 130 every day, where his MD wants it to be. Al is doing well in rehab.  His weight goes up and down, but he thinks that it is trending down overall.  He is down 20 lb since all of this started.  His pressures are doing well and his sugars continue to do well.  He is good about using his inhalers.  His breathing is still hanging around about the same, but his is also limited his pain too. He is doing well with using his PLB.     Expected Outcomes Short: Continue to use breathing treatments on bad breathing days Long: Conitnue to work on weight loss Short: Continue to work on weight loss  and monitor BP/BS Long: Work toward weight goal under 200 lb Short: Continue to work on [employer] loss Long: Continue to monitor risk factors.              Core Components/Risk Factors/Patient Goals at Discharge (Final Review):   Goals and Risk Factor Review - 07/06/22 0801       Core Components/Risk Factors/Patient Goals Review   Personal Goals Review Weight Management/Obesity;Improve shortness of breath with ADL's;Increase knowledge of respiratory medications and ability to use respiratory devices properly.;Diabetes;Hypertension    Review Al is doing well in rehab.  His weight goes up and down, but he thinks that it is trending down overall.  He is down 20 lb since all of this started.  His pressures are doing well and his sugars continue to do well.  He is good about using his inhalers.  His breathing is still hanging around about  the same, but his is also limited his pain too. He is doing well with using his PLB.    Expected Outcomes Short: Continue to work on weight loss Long: Continue to monitor risk factors.             ITP Comments:  ITP Comments     Row Name 05/31/22 1349 06/28/22 0934 07/26/22 1124 08/22/22 1458 09/20/22 1200   ITP Comments 30 day review completed. ITP sent to Dr. Jinny Sanders, Medical Director of  Pulmonary Rehab. Continue with ITP unless changes are made by physician. 30 Day review completed. Medical Director ITP review done, changes made as directed, and signed approval by Medical Director. 30 Day review completed. Medical Director ITP review done, changes made as directed, and signed approval by Medical Director. 30 Day review completed. Medical Director ITP review done, changes made as directed, and signed approval by Medical Director. 30 Day review completed. Medical Director ITP review done, changes made as directed, and signed approval by Medical Director.    Row Name 10/18/22 1110 11/07/22 0843 11/09/22 1532       ITP Comments 30 Day review  completed. Medical Director ITP review done, changes made as directed, and signed approval by Medical Director. Called to check on Al. He has not been to rehab since 09/28/22. He has called to cancel several appointments, however has "no-showed" the last 3 visits. No answer when called today. Left message asking that he call us. See previous telephone note. Spoke with Al, he said that "he is not in a good place right now" after being out several weeks, he also just had to have oral surgery yesterday and does not follow up for another 3 weeks. He is not sure when he could return to rehab. Discussed early discharge as he has completed 26 visits. Al agreed to discharge at this time and he understands that he may return with a new referral when he is able to come consistently. Al appreciative and voiced understanding.              Comments: Discharge ITP

## 2022-11-09 NOTE — Telephone Encounter (Signed)
See previous telephone note. Spoke with Ethan Frye, he said that "he is not in a good place right now" after being out several weeks, he also just had to have oral surgery yesterday and does not follow up for another 3 weeks. He is not sure when he could return to rehab. Discussed early discharge as he has completed 26 visits. Ethan Frye agreed to discharge at this time and he understands that he may return with a new referral when he is able to come consistently. Ethan Frye appreciative and voiced understanding.

## 2022-11-09 NOTE — Telephone Encounter (Signed)
Please see subsequent message. Pt discharged from rehab.

## 2022-11-14 ENCOUNTER — Ambulatory Visit: Payer: Medicare Other

## 2022-11-16 ENCOUNTER — Ambulatory Visit: Payer: Medicare Other

## 2022-11-21 ENCOUNTER — Ambulatory Visit: Payer: Medicare Other

## 2022-11-23 ENCOUNTER — Ambulatory Visit: Payer: Medicare Other

## 2022-11-28 ENCOUNTER — Ambulatory Visit: Payer: Medicare Other

## 2022-11-30 ENCOUNTER — Ambulatory Visit: Payer: Medicare Other
